# Patient Record
Sex: Female | Born: 1935
Health system: Southern US, Community
[De-identification: ages and names within clinical notes are randomized; demographics above are authoritative.]

## PROBLEM LIST (undated history)

## (undated) DIAGNOSIS — R569 Unspecified convulsions: Secondary | ICD-10-CM

## (undated) DIAGNOSIS — N390 Urinary tract infection, site not specified: Secondary | ICD-10-CM

## (undated) DIAGNOSIS — F0390 Unspecified dementia without behavioral disturbance: Secondary | ICD-10-CM

## (undated) DIAGNOSIS — F329 Major depressive disorder, single episode, unspecified: Secondary | ICD-10-CM

## (undated) DIAGNOSIS — I10 Essential (primary) hypertension: Secondary | ICD-10-CM

## (undated) DIAGNOSIS — Z9289 Personal history of other medical treatment: Secondary | ICD-10-CM

## (undated) DIAGNOSIS — R51 Headache: Secondary | ICD-10-CM

## (undated) DIAGNOSIS — F419 Anxiety disorder, unspecified: Secondary | ICD-10-CM

## (undated) DIAGNOSIS — I639 Cerebral infarction, unspecified: Secondary | ICD-10-CM

## (undated) DIAGNOSIS — F32A Depression, unspecified: Secondary | ICD-10-CM

## (undated) HISTORY — DX: Unspecified convulsions: R56.9

## (undated) HISTORY — PX: FOOT SURGERY: SHX648

## (undated) HISTORY — DX: Personal history of other medical treatment: Z92.89

---
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
## 1979-03-18 HISTORY — PX: ABDOMINAL HYSTERECTOMY: SHX81

## 1979-03-18 HISTORY — PX: APPENDECTOMY: SHX54

## 1991-07-18 HISTORY — PX: CHOLECYSTECTOMY: SHX55

## 2000-07-17 DIAGNOSIS — I639 Cerebral infarction, unspecified: Secondary | ICD-10-CM

## 2000-07-17 HISTORY — DX: Cerebral infarction, unspecified: I63.9

## 2001-09-12 ENCOUNTER — Encounter: Payer: Self-pay | Admitting: Family Medicine

## 2001-09-12 ENCOUNTER — Encounter: Admission: RE | Admit: 2001-09-12 | Discharge: 2001-09-12 | Payer: Self-pay | Admitting: Family Medicine

## 2001-11-26 ENCOUNTER — Inpatient Hospital Stay (HOSPITAL_COMMUNITY): Admission: EM | Admit: 2001-11-26 | Discharge: 2001-11-29 | Payer: Self-pay | Admitting: Emergency Medicine

## 2001-11-26 ENCOUNTER — Encounter: Payer: Self-pay | Admitting: Emergency Medicine

## 2001-11-27 ENCOUNTER — Encounter: Payer: Self-pay | Admitting: Internal Medicine

## 2002-01-31 ENCOUNTER — Encounter: Payer: Self-pay | Admitting: Emergency Medicine

## 2002-02-01 ENCOUNTER — Emergency Department (HOSPITAL_COMMUNITY): Admission: EM | Admit: 2002-02-01 | Discharge: 2002-02-01 | Payer: Self-pay | Admitting: Emergency Medicine

## 2002-03-25 ENCOUNTER — Encounter: Payer: Self-pay | Admitting: Orthopedic Surgery

## 2002-03-31 ENCOUNTER — Inpatient Hospital Stay (HOSPITAL_COMMUNITY): Admission: RE | Admit: 2002-03-31 | Discharge: 2002-04-04 | Payer: Self-pay | Admitting: Orthopedic Surgery

## 2002-03-31 ENCOUNTER — Encounter: Payer: Self-pay | Admitting: Orthopedic Surgery

## 2002-04-01 ENCOUNTER — Encounter: Payer: Self-pay | Admitting: Orthopedic Surgery

## 2004-05-10 ENCOUNTER — Emergency Department (HOSPITAL_COMMUNITY): Admission: EM | Admit: 2004-05-10 | Discharge: 2004-05-10 | Payer: Self-pay | Admitting: Emergency Medicine

## 2004-11-28 ENCOUNTER — Encounter: Admission: RE | Admit: 2004-11-28 | Discharge: 2004-11-28 | Payer: Self-pay | Admitting: Family Medicine

## 2005-01-13 ENCOUNTER — Ambulatory Visit (HOSPITAL_COMMUNITY): Admission: RE | Admit: 2005-01-13 | Discharge: 2005-01-13 | Payer: Self-pay | Admitting: Neurology

## 2005-07-03 ENCOUNTER — Emergency Department (HOSPITAL_COMMUNITY): Admission: EM | Admit: 2005-07-03 | Discharge: 2005-07-03 | Payer: Self-pay | Admitting: Emergency Medicine

## 2006-01-01 ENCOUNTER — Emergency Department (HOSPITAL_COMMUNITY): Admission: EM | Admit: 2006-01-01 | Discharge: 2006-01-01 | Payer: Self-pay | Admitting: Emergency Medicine

## 2006-09-18 ENCOUNTER — Ambulatory Visit (HOSPITAL_BASED_OUTPATIENT_CLINIC_OR_DEPARTMENT_OTHER): Admission: RE | Admit: 2006-09-18 | Discharge: 2006-09-18 | Payer: Self-pay | Admitting: Orthopedic Surgery

## 2006-10-04 ENCOUNTER — Ambulatory Visit (HOSPITAL_BASED_OUTPATIENT_CLINIC_OR_DEPARTMENT_OTHER): Admission: RE | Admit: 2006-10-04 | Discharge: 2006-10-04 | Payer: Self-pay | Admitting: Orthopedic Surgery

## 2007-02-27 ENCOUNTER — Encounter: Admission: RE | Admit: 2007-02-27 | Discharge: 2007-02-27 | Payer: Self-pay | Admitting: Family Medicine

## 2007-03-04 ENCOUNTER — Encounter: Admission: RE | Admit: 2007-03-04 | Discharge: 2007-03-04 | Payer: Self-pay | Admitting: Family Medicine

## 2007-07-18 HISTORY — PX: EYE SURGERY: SHX253

## 2007-09-23 ENCOUNTER — Emergency Department (HOSPITAL_COMMUNITY): Admission: EM | Admit: 2007-09-23 | Discharge: 2007-09-23 | Payer: Self-pay | Admitting: Emergency Medicine

## 2008-03-02 ENCOUNTER — Encounter: Admission: RE | Admit: 2008-03-02 | Discharge: 2008-03-02 | Payer: Self-pay | Admitting: Family Medicine

## 2008-04-02 ENCOUNTER — Emergency Department (HOSPITAL_COMMUNITY): Admission: EM | Admit: 2008-04-02 | Discharge: 2008-04-02 | Payer: Self-pay | Admitting: Emergency Medicine

## 2009-03-03 ENCOUNTER — Encounter: Admission: RE | Admit: 2009-03-03 | Discharge: 2009-03-03 | Payer: Self-pay | Admitting: Family Medicine

## 2010-03-08 ENCOUNTER — Encounter: Admission: RE | Admit: 2010-03-08 | Discharge: 2010-03-08 | Payer: Self-pay | Admitting: Family Medicine

## 2010-07-17 HISTORY — PX: JOINT REPLACEMENT: SHX530

## 2010-08-08 ENCOUNTER — Encounter: Payer: Self-pay | Admitting: Family Medicine

## 2010-08-15 ENCOUNTER — Emergency Department (HOSPITAL_BASED_OUTPATIENT_CLINIC_OR_DEPARTMENT_OTHER)
Admission: EM | Admit: 2010-08-15 | Discharge: 2010-08-15 | Payer: Self-pay | Source: Home / Self Care | Admitting: Emergency Medicine

## 2010-08-15 LAB — CBC
HCT: 40.1 % (ref 36.0–46.0)
MCH: 30.7 pg (ref 26.0–34.0)
MCV: 93.3 fL (ref 78.0–100.0)
Platelets: 291 10*3/uL (ref 150–400)
WBC: 14.5 10*3/uL — ABNORMAL HIGH (ref 4.0–10.5)

## 2010-08-15 LAB — COMPREHENSIVE METABOLIC PANEL
Albumin: 4.6 g/dL (ref 3.5–5.2)
CO2: 27 mEq/L (ref 19–32)
Creatinine, Ser: 0.8 mg/dL (ref 0.4–1.2)
GFR calc Af Amer: >60 mL/min (ref >60)
GFR calc non Af Amer: >60 mL/min (ref >60)
Glucose, Bld: 126 mg/dL — ABNORMAL HIGH (ref 70–99)
Potassium: 4.5 mEq/L (ref 3.5–5.1)
Sodium: 144 mEq/L (ref 135–145)
Total Bilirubin: 0.7 mg/dL (ref 0.3–1.2)
Total Protein: 8 g/dL (ref 6.0–8.3)

## 2010-08-15 LAB — DIFFERENTIAL
Basophils Absolute: 0 10*3/uL (ref 0.0–0.1)
Eosinophils Relative: 1 % (ref 0–5)
Lymphs Abs: 4.8 10*3/uL — ABNORMAL HIGH (ref 0.7–4.0)
Monocytes Absolute: 0.9 10*3/uL (ref 0.1–1.0)
Monocytes Relative: 6 % (ref 3–12)
Neutro Abs: 8.6 10*3/uL — ABNORMAL HIGH (ref 1.7–7.7)
Neutrophils Relative %: 59 % (ref 43–77)

## 2010-08-15 LAB — POCT TOXICOLOGY PANEL: Benzodiazepines: POSITIVE

## 2010-08-15 LAB — URINE MICROSCOPIC-ADD ON

## 2010-08-15 LAB — URINALYSIS, ROUTINE W REFLEX MICROSCOPIC
Hgb urine dipstick: NEGATIVE
Specific Gravity, Urine: 1.016 (ref 1.005–1.030)

## 2010-08-15 LAB — ETHANOL: Alcohol, Ethyl (B): <10 mg/dL (ref 0–10)

## 2010-12-02 NOTE — H&P (Signed)
Martha Olsen, Martha Olsen                         ACCOUNT NO.:  [id number]   MEDICAL RECORD NO.:  [id number]                   PATIENT TYPE:  INP   LOCATION:  0477                                 FACILITY:  [facility]   PHYSICIAN:  Martha Olsen, M.D.              DATE OF BIRTH:  Jul 21, 1935   DATE OF ADMISSION:  03/31/2002  DATE OF DISCHARGE:                                HISTORY & PHYSICAL   This admission History and Physical was done in the office on March 25, 2002.   CHIEF COMPLAINT:  Left hip pain.   HISTORY OF PRESENT ILLNESS:  The patient is a 75 year old female who has  been seen in consultation by Martha Olsen, M.D. for ongoing left hip  pain.  She has been followed by Martha Olsen, M.D. for left hip pain and  buttock pain due to arthritis.  She has previously undergone hip injection  without relief.  She has been treated conservatively for hip arthritis;  however, has not improved with conservative measures.  She has been seen in  consultation by Martha Olsen, M.D.  X-rays in the office revealed end-  stage osteoarthritis.  It was felt the patient would benefit from undergoing  a left total hip replacement arthroplasty.  The patient has had a  significant history of a stroke earlier this year.  She has been seen  preoperatively by Martha Olsen, M.D. and felt to be stable for hip  replacement.  Risks and benefits have been discussed and the patient has  elected to proceed with surgery.   ALLERGIES:  No known drug allergies.   CURRENT MEDICATIONS:  1. Hydrochlorothiazide 25 mg daily.  2. Vioxx 25 mg daily--stopped prior to surgery.  3. Hydrocodone 7.5 mg p.r.n. pain.  4. Lipitor 10 mg daily.  5. Plavix--stopped prior to surgery.  6. Aspirin--stopped prior to surgery.   PAST MEDICAL HISTORY:  1. History of CVA in May of 2003.  2. Postmenopausal.  3. History of hypertension.   PAST SURGICAL HISTORY:  1. Cholecystectomy.  2. Hysterectomy.   SOCIAL HISTORY:  She is married, retired, denies the use of tobacco products  or alcohol products.  Has four children.  Her granddaughters will be  assisting her after surgery.  She lives in a two-story home with 14 stairs.   FAMILY HISTORY:  Father deceased at age 62, had a history of MI.  Mother  deceased at age 36 with a history of stroke.   REVIEW OF SYSTEMS:  GENERAL:  No fevers, chills, night sweats.  NEUROLOGICAL:  Denies seizures, syncope, paralysis. RESPIRATORY:  No  shortness of breath, productive cough, hemoptysis. CARDIOVASCULAR:  No chest  pain, angina, orthopnea.  GI:  Denies nausea, vomiting, diarrhea,  constipation.  GU:  No dysuria, hematuria, or discharge.  MUSCULOSKELETAL:  Pertinent as found in the History of Present Illness concerning the left  hip.  PHYSICAL EXAMINATION:  VITAL SIGNS:  Pulse 80, respirations 16, blood  pressure 164/84.  GENERAL:  The patient is a 75 year old female, well nourished, well  developed.  She is in no acute distress.  She is alert, oriented,  cooperative and very pleasant on the exam.  She is accompanied by her  husband during the visit.  HEENT:  Normocephalic, atraumatic.  Pupils round and reactive.  EOMs are  intact.  Oropharynx clear.  The patient does have upper and lower dentures  noted.  NECK:  Supple.  CHEST:  Clear to auscultation anterior posterior chest walls.  HEART:  Regular rate and rhythm.  No murmurs, S1 & S2 noted.  ABDOMEN:  Soft, nontender, bowel sounds present.  RECTAL, BREAST, GENITALIA:  Not done and not pertinent to present illness.  EXTREMITIES:  Limited to the left lower extremity, she does ambulate with an  antalgic gait.  There is no erythema concerning the left hip.  She does have  pain with flexion and internal rotation concerning the left hip.  Sensation  is intact.   IMPRESSION:  1. Osteoarthritis left hip.  2. Hypertension.  3. History of cerebrovascular accident in 2003.  4. Postmenopausal.    PLAN:  The patient being admitted to [facility] and undergo left  total hip replacement arthroplasty.  The patient has been seen  perioperatively by Martha Olsen, M.D. and felt to be stable for surgery.  Dr. Clovis Riley will be notified of the room number on admission and be  consulted for medical assistance with this patient if required during the  hospital course.      Martha Olsen, [place]                Martha Rankin Olsen, M.D.    ALP/MEDQ  D:  04/02/2002  T:  04/02/2002  Job:  [id number]

## 2010-12-02 NOTE — Op Note (Signed)
Martha Olsen, Martha Olsen               ACCOUNT NO.:  [id number]   MEDICAL RECORD NO.:  [id number]          PATIENT TYPE:  AMB   LOCATION:  DSC                          FACILITY:  [facility]   PHYSICIAN:  Leonides Grills, M.D.     DATE OF BIRTH:  [birth year]   DATE OF PROCEDURE:  09/18/2006  DATE OF DISCHARGE:                               OPERATIVE REPORT   PREOPERATIVE DIAGNOSES:  1. Right great toe metatarsophalangeal joint arthritis with dorsal      bunion.  2. Right tight gastrocnemius.   POSTOPERATIVE DIAGNOSES:  1. Right great toe metatarsophalangeal joint arthritis with dorsal      bunion.  2. Right tight gastrocnemius.   OPERATION:  1. Right great toe metatarsophalangeal joint fusion.  2. Right local bone graft.  3. Stress x-rays, right foot.  4. Right gastrocnemius slide.   ANESTHESIA:  General.   SURGEON:  Leonides Grills, M.D.   ASSISTANT:  Evlyn Kanner, P.A.   ESTIMATED BLOOD LOSS:  Minimal.   TOURNIQUET TIME:  Approximately an hour.   COMPLICATIONS:  None.   DISPOSITION:  Stable to PAR.   INDICATIONS:  A 75 year old female who has had longstanding dorsal  bunion with arthritic changes after a failed bunionectomy.  She was  consented for the above procedure.  All risks, which include infection,  nerve or vessel injury, nonunion, malunion, hardware rotation, hardware  failure, persistent pain, worse pain, prolonged recovery, stiffness,  arthritis, cock-up toe deformity, flexion contracture of the IP joint  that may cause symptoms and may require an FHL tenotomy, were all  explained.  Questions were encouraged and answered.   OPERATIVE PROCEDURE:  The patient was brought to the operating room and  placed in supine position after adequate general endotracheal tube  anesthesia administered as well as Ancef 1 g IV piggyback.  The right  lower extremity was then prepped and draped in a sterile manner over a  proximally-placed thigh tourniquet.  We started the procedure  with a  longitudinal incision over the medial aspect of the gastrocnemius  musculotendinous junction.  Dissection was carried down through skin.  Hemostasis was obtained.  The fascia was opened in line with the  incision.  The conjoint region was then developed between the gastroc  and soleus.  Soft tissues then elevated off the posterior aspect of the  gastrocnemius tendon.  The sural nerve was identified and protected  posteriorly.  The gastrocnemius was then released with a curved Mayo  scissors with the aid of a Kocher.  This had excellent release of the  tight gastroc.  The area was copiously irrigated with normal saline.  The subcu was closed with 3-0 Vicryl.  The skin was closed 4-0 Monocryl  subcuticular stitch.  Steri-Strips were applied.  The limb was gravity-  exsanguinated and the tourniquet was elevated to 290 mmHg.  A  longitudinal incision over the medial aspect of the great toe MTP joint  was then made.  Dissection was carried down through skin.  Hemostasis  was obtained.  The capsule was opened in line with the incision.  The  joint  was then entered.  A soft tissue release both dorsally and  plantarly to allow [place] to relocate the toe.  The remaining cartilage,  which was minimal, was removed from either side of the joint.  The bone  was severely osteopenic in consistency and soft.  Multiple 2-mm drill  holes were placed on either side of the joint.  A 1.5 mm K-wire was then  utilized to reduce the toe in an anatomic position in AP and lateral  planes.  In the lateral plane the proximal phalanx was just dorsal to  parallel with the plantar aspect, weightbearing aspect of the foot.  This was verified under C-arm guidance in the AP and lateral planes.  We  did this purposely because of the slight contracture she has had through  the IP joint, and the toe with this slight contracture was only about 3  mm off the weightbearing surface of the foot.  We then throughout the  case  obtained local bone graft from the drills as well as the spurs that  were debrided.  We then placed a 3.5 mm fully-threaded cortical lag  screw using a 3.5 and 2.5 mm drill hole, respectively.  This was from  the plantar medial aspect of the base of the proximal phalanx into the  metatarsal head.  We then removed the K-wire and then placed a screw in  line with this K-wire.  Again, this was 3.5, 2.5 mm drill hole,  respectively.  This had excellent purchase and maintenance of the  correction.  Final x-rays were obtained in the AP and lateral planes.  stress x-rays which showed no gross motion across the site of fixation,  proper position and excellent alignment as well.  The area was copiously  irrigated with normal saline.  Stress-strain relieving local bone graft  was applied using a bur and the graft obtained throughout the procedure.  The tourniquet was deflated.  Hemostasis was obtained.  The capsule was  closed with 3-0 Vicryl.  The skin was closed with 4-0 nylon.  A sterile  dressing was applied.  A modified Jones dressing was applied with the  ankle in neutral dorsiflexion.  The patient was stable to the PAR.   POSTOP COURSE:  The patient will follow up in 2 weeks.  At that time we  will remove the Jones dressing as well as sutures.  She is to remain  nonweightbearing for a total of 1 month and then heel weightbear in a  Cam walker boot for an additional month.  Depending on her symptoms as  well as x-ray findings, she will then go into a normal shoe.  She is to  keep this elevated.  Active range of motion of the toes is encouraged.      Leonides Grills, M.D.  Electronically Signed     PB/MEDQ  D:  09/18/2006  T:  09/18/2006  Job:  [id number]

## 2010-12-02 NOTE — Discharge Summary (Signed)
NAMEMAYLEIGH, TETRAULT                         ACCOUNT NO.:  [id number]   MEDICAL RECORD NO.:  [id number]                   PATIENT TYPE:  INP   LOCATION:  0477                                 FACILITY:  [facility]   PHYSICIAN:  Gus Rankin. Aluisio, M.D.              DATE OF BIRTH:  11/29/1935   DATE OF ADMISSION:  03/31/2002  DATE OF DISCHARGE:  04/04/2002                                 DISCHARGE SUMMARY   ADMISSION DIAGNOSIS:  1. Osteoarthritis, left hip.  2. Hypertension.  3. History of cardiovascular accident in 2003.  4. Postmenopausal.   DISCHARGE DIAGNOSIS:  1. Osteoarthritis, left hip, status post left total hip replacement     arthroplasty.  2. Mild postoperative blood loss anemia.  3. Hypertension.  4. History of cardiovascular accident in 2003.  5. Postmenopausal.   PROCEDURE:  The patient was taken to the OR on March 31, 2002 and  underwent left total hip replacement arthroplasty.   SURGEON:  Ollen Gross, M.D.   ASSISTANT:  Patrica Duel, P.A.C.   ANESTHESIA:  General.   DRAINS:  Hemovac drain times one.   CONSULTATIONS:  none.   HISTORY OF PRESENT ILLNESS:  The patient is a 75 year old female who has  been seen in consultation by Dr. Lequita Halt for ongoing left hip pain. She was  referred over by Dr. Ethelene Hal. She has previously undergone hip injection  without relief and has been treated conservatively for hip arthritis.  However, she has not improved. She was seen in consultation by Dr. Lequita Halt  and found to have end stage osteoarthritis. It was felt that she would  benefit from undergoing total hip replacement. Risks and benefits were  discussed and the patient elected to proceed with surgery.   LABORATORY DATA:  CBC on admission revealed hemoglobin and hematocrit 11.2  and 33.3. White cell count 9.9, red blood cells count 3.93. Differential  within normal limits. Postoperative hemoglobin and hematocrit 9.5 and 29.6.  Last noted hemoglobin and  hematocrit 9.10 and 27.6. PT and PTT 11.7 and 26  respectively with an INR of 0.8. Serial protimes followed for Coumadin  protocol. Last noted PT and INR 23.9 and 2.5. Chemistry on admission  revealed minimally elevated glucose of 117. Remaining chem panel all within  normal limits. Serial B-met showed electrolytes to be within normal limits.  Glucose went up from 117 to 155. UA on admission showed trace leukocyte  esterase with rare epithelial cells and 0-2 white cells. Otherwise,  negative. Follow-up UA on April 01, 2002 was negative. Blood group type  was 0+. Urine culture taken on April 01, 2002 showed no growth.   DIAGNOSTIC STUDIES:  Preoperative  chest x-ray showed no active disease.  Preoperative  hip films revealed severe degenerative arthritis of the left  hip with complete loss of superior joint space height. Postoperative hip  films on March 31, 2002 as well as pelvic and left hip  films showed good  position and alignment following left total hip prosthesis. Follow-up chest  x-ray on March 22, 2002 showed no active disease. I do not see an EKG on  the chart.   HOSPITAL COURSE:  The patient was admitted to [facility]  and taken to the OR and underwent above stated procedure without  complications. The patient tolerated the procedure well. Was later  transferred to the recovery room and to the orthopedic floor with continued  postoperative care. Placed on PCA analgesics for pain control following  surgery. Underwent 24 hours of postoperative IV antibiotics. Placed touch  down weight bearing. Labs were followed closely. May be found in the lab  data section as above. Hemovac drain placed at the time of surgery was  pulled on postoperative day one. X-ray's of the hip showed good position and  alignment of the left total hip. The patient unfortunately spiked a  temperature up to 101.1 on the evening of the postoperative day one.  Underwent chest  x-ray and UA. The UA was negative. The chest x-ray showed no  active disease. Felt to be a cyclic temperature through the night.  Encouraged incentive spirometer and antipruritic medications. Temperature  was back down and she was afebrile by postoperative day three. PT and OT  were consulted to assist with gait training and ambulation from a therapy  standpoint. The patient did fairly well. She was up ambulating approximately  45 feet by postoperative day two and then greater than 120 feet by  postoperative day three. Dressing changes initiated on postoperative day two  and incision looked excellent. No signs of infection. Weaned off PCA  analgesics over to P.O. med's. Continued to progress with therapy. Tolerated  medications and by postoperative day four, doing quite well. Had moved her  bowels and voiding okay.   DISPOSITION:  Discharged to home on April 04, 2002.   DISCHARGE MEDICATIONS:  Coumadin, Percocet, and Robaxin.   DIET:  Low sodium diet.   ACTIVITY:  Touch down weight bearing. Home health PT. Home health nursing  through [place]. Hip precautions at all times.   FOLLOW UP:  End of the following week following her discharge. Call for an  appointment.   CONDITION ON DISCHARGE:  Improved.     Alexzandrew L. Julien Girt, P.A.              Gus Rankin Aluisio, M.D.    ALP/MEDQ  D:  04/29/2002  T:  04/29/2002  Job:  [id number]   cc:   Ollen Gross, MD  [address]  [place]  [place] [address]  Fax: [phone]

## 2010-12-02 NOTE — Discharge Summary (Signed)
[facility]  Patient:    Martha Olsen, Martha Olsen Visit Number: [id number] MRN: [id number]          Service Type: MED Location: [id number] Attending Physician:  Darnelle Bos Dictated by:   Theressa Millard, M.D. Admit Date:  11/26/2001 Discharge Date: 11/29/2001   CC:         Dr. Lupe Carney   Discharge Summary  ADMISSION DIAGNOSIS:  Stroke.  DISCHARGE DIAGNOSES: 1. Stroke, right basal ganglia, source unidentified. 2. Anemia, normocytic, with elevated RDW.  Peripheral lab work-up negative. 3. Arthritis involving the hip. 4. Hypertension.  HISTORY OF PRESENT ILLNESS:  The patient is a 75 year old white female who suffered a stroke causing her to have slurred speech and facial drooping approximately two days prior to admission.  HOSPITAL COURSE:  The patient was admitted and was already improving by the time she arrived to the hospital approximately 48 hours after the onset of symptoms.  She was admitted by Dr. _____________ and underwent CT scanning which confirmed a right basal ganglia stroke.  There was no evidence of hemorrhage. She was chronically on aspirin at home and Plavix was added. Heparin was not thought to be indicated as there was no evidence of cardiac source.  MRI confirmed the basal ganglia stroke.  There was no evidence of intracranial or carotid disease on MRA and 2-D echocardiogram was negative. Doppler examination did reveal some 40 to 60% left internal carotid artery stenosis (opposite side from stroke), but no disease on the right.  The source of the patients stroke was unknown.  She was seen by speech therapy and was on a diet with chopped meats mainly for dental issues but otherwise was able to eat and drink thin liquids and all other consistencies. She was never seen by OT and PT as that was not necessary. She was able to ambulate without problems. She was discharged in improved condition.  Upon admission, she  was noted to be slightly anemic. Her hemoglobin was 10.5. Two days later, it was 10.2.  MCV was normal but RDW was slightly elevated. Therefore, sed rate, RBC folate, ferritin, iron and iron binding capacity and B12 levels were checked.  All of these were within normal limits.  The cause of her anemia is not known at this time and this should be monitored as an outpatient.  Her blood pressure was well controlled during her hospitalization.  MEDICATIONS: 1. Hydrochlorothiazide 25 mg once daily. 2. Vioxx 25 mg daily. 3. _______ 75 mg daily. 4. Aspirin one daily.  ACTIVITIES:  She may slowly increase activities and may return to work on Dec 06, 2001.  DIET:  She is to eat a no added salt, low fat diet.  FOLLOW-UP:  She will call to make an appointment to see Dr. Lupe Carney in approximately two weeks. At that time, consideration should also be give to review her cholesterol to decide on whether the patient will need a cholesterol lowering medication. Dictated by:   Theressa Millard, M.D. Attending Physician:  Darnelle Bos DD:  11/29/01 TD:  12/02/01 Job: [id number]

## 2010-12-02 NOTE — Op Note (Signed)
NAMEMARONDA, CAISON               ACCOUNT NO.:  [id number]   MEDICAL RECORD NO.:  [id number]          PATIENT TYPE:  AMB   LOCATION:  DSC                          FACILITY:  [facility]   PHYSICIAN:  Leonides Grills, M.D.     DATE OF BIRTH:  1935-09-25   DATE OF PROCEDURE:  10/04/2006  DATE OF DISCHARGE:                               OPERATIVE REPORT   PREOPERATIVE DIAGNOSES:  1. Status post right great toe MTP joint fusion and loss of fixation.  2. Complication of hardware, foot.   POSTOPERATIVE DIAGNOSIS:  Status post right great toe MTP joint fusion  and loss of fixation.   OPERATION:  1. Revision, right great toe MTP joint fusion.  2. Stress x-rays, right foot.  3. Hardware removal, foot.   ANESTHESIA:  General.   SURGEON:  Leonides Grills, M.D.   ASSISTANT:  Evlyn Kanner, P.A.-C.   ESTIMATED BLOOD LOSS:  Minimal.   TOURNIQUET TIME:  Approximately an hour.   COMPLICATIONS:  None.   DISPOSITION:  Stable to PR.   INDICATIONS:  This is a 75 year old female who has had longstanding  right great toe pain secondary MTP joint arthritis.  She underwent 2  weeks ago a right great toe MTP joint fusion.  She stubbed against her  bedpost some time during the 2 weeks and presented to the office  yesterday with a extension deformity through the fusion site.  X-ray  showed that she had a loss of fixation, which caused a fracture at the  base of proximal phalanx and obviously was in a bad position.  She was  consented for the above procedure.  All risks which include infection,  nerve or vessel injury, nonunion, malunion, hardware rotation, hardware  failure, persistent pain, worse pain, prolonged recovery, stiffness,  arthritis of neighboring joints were all explained.  Questions were  encouraged and answered.   OPERATIVE PROCEDURE:  The patient was brought to the operating room and  placed in supine position.  After adequate general endotracheal tube  anesthesia was administered with  popliteal block as well as Ancef 1 g IV  piggyback, the right lower extremity was prepped and draped in sterile  manner over a proximally placed thigh tourniquet.  Limb was gravity  exsanguinated.  Tourniquet was elevated to 290 mmHg.  Longitudinal  incision over the previous incision was then made, and this was extended  approximately 1.5-cm proximally and distally.  Dissection was carried  down to bone.  Remaining 2 screws were removed.  Once the hardware was  removed, it was found that she did fracture the base of the proximal  phalanx and had the extension deformity.  We then chose a 5-hole 1/3  tubular plate x2 and prebent this and placed this in a stacked  figuration.  We then obtained in AP and lateral views x-rays to make  sure that this was well aligned and especially where the proximal  phalanx was parallel to the plantar aspect of her foot, and this was  verified on lateral x-ray with an instrument cover placed on the plantar  aspect of her foot and  showed that this was in excellent position.  We  then placed 2 distal 3.5-mm fully-threaded cortical screws using 2.5-mm  drill holes, respectively.  There was excellent purchase made in this  correction.  We then placed the three proximal screws into the first  metatarsal in a compression-type manner.  This had excellent compression  across the fusion site.  We then placed another screw from the medial  aspect of the first metatarsal head into the proximal phalanx as a  derotational screw as well.  Final stress x-rays were obtained in AP and  lateral planes and showed no gross motion across the fusion site,  fixation and proposition of toe in excellent alignment as well.  We did  not need to obtain any more bone graft.  The fusion site looked  excellent and actually was starting to already heal with some callus in  this area.  Tourniquet was deflated.  Hemostasis was obtained.  The area  was copiously irrigated with normal saline.   Capsule was closed with 2-0  Vicryl.  Subcutaneous tissue was closed with 3-0 Vicryl.  Skin was  closed 4-0 nylon.  Sterile dressing was applied.  Modified Jones  dressing was applied with the ankle in neutral dorsiflexion, and the  patient was stable to PR.   POSTOPERATIVE COURSE:  The patient will follow up in 2 weeks.  At that  time, we will remove the Jones dressing as well as sutures.  We will  then place her in a short-leg nonweight bearing cast, and this will be  extended out beyond the great toe with the ankle in neutral  dorsiflexion.  She will be nonweightbearing for a total of 2 months and  go into a cam walker and go weight bearing as tolerated.  At the end of  3 months, she will go into a normal shoe.      Leonides Grills, M.D.  Electronically Signed     PB/MEDQ  D:  10/04/2006  T:  10/04/2006  Job:  [id number]

## 2010-12-02 NOTE — Op Note (Signed)
Martha Olsen                         ACCOUNT NO.:  [id number]   MEDICAL RECORD NO.:  [id number]                   PATIENT TYPE:  INP   LOCATION:  X001                                 FACILITY:  [facility]   PHYSICIAN:  Gus Rankin. Aluisio, M.D.              DATE OF BIRTH:  [birth year]   DATE OF PROCEDURE:  03/31/2002  DATE OF DISCHARGE:                                 OPERATIVE REPORT   PREOPERATIVE DIAGNOSES:  Osteoarthritis of the left hip.   POSTOPERATIVE DIAGNOSES:  Osteoarthritis of the left hip.   PROCEDURE:  Left total hip arthroplasty.   SURGEON:  Gus Rankin. Aluisio, M.D.   ASSISTANT:  Alexzandrew L. Julien Girt, P.A.   ANESTHESIA:  General.   ESTIMATED BLOOD LOSS:  200 during Hemovac x1   COMPLICATIONS:  None.   CONDITION:  Stable to recovery.   BRIEF CLINICAL NOTE:  This patient is a 75 year old female with severe  osteoarthritis of the left hip with pain refractory to nonoperative  management.  She also has dysplastic change of the hip, both in the  acetabulum and femur.  She presents now for left total hip arthroplasty.   PROCEDURE IN DETAIL:  After successful administration of general anesthetic,  the patient was placed in the right lateral decubitus position with the left  side up and held with the hip positioner.  The left lower extremity was  isolated from the peroneum with plastic drapes and prepped and draped in the  usual sterile fashion.  A standard posterolateral incision was made with a  10 blade through the subcutaneous tissue to the level of the fascia lata.  A  fresh blade was used to incise the fascia lata in line with the skin  incision.  The sciatic nerve was palpated and protected, and short external  rotators were isolated off the femur.  A capsulectomy was performed.  The  hip dislocated and the center of the femoral head was marked.  A trial  prosthesis was placed.  This was centered with the trial head corresponding  to the center of the  patient's native femoral head.  Osteotomy line was  marked on the femoral neck, and an osteotomy was made with an oscillating  saw.  The femoral head was then removed.  Acetabular exposure was obtained  and the labrum removed.  Acetabular reaming was then initiated, starting at  45, coursing up in increments to 51 and a 52 mm pinnacle acetabular shell  was placed in anatomic position anteriorly and was affixed with a single  dome screw with excellent purchase.  A trial 52 mm neutral liner was placed.   The femur was then prepared first with a canal finder, and the canal was  irrigated.  The actual reaming was performed with a 11.5 proximal, reaming  up to a 16 D and the ___________ machine to a large.  A 16 D large trial  sleeve  was placed with a 16 x 11 stem and a 36+6 and 28+0 head.  We matched  her native anteversion, which was about 25 degrees, slightly more than  normal.  The hip was then reduced with excellent stability, full extension,  full external rotation, 70 degrees of flexion, 40 degrees of abduction, 90  degrees of internal rotation, 90 degrees of flexion, and 90 degrees of  internal rotation.  The hip was then dislocated.  Trials were removed, and a  permanent apex hole eliminator placed through the acetabular shell.  A  permanent 28 mm neutral Marathon liner was then impacted into the shell.  Proximally in the femur, the 16 D large sleeve is placed with a 16 x 11  stem, 36+6 neck, matching her native anteversion.  The 28+3 head is  utilized.  The head also utilized in a trial, and I had very soft tissue  tension with that head.  The 28+3 permanent head was utilized.  The hip was  reduced with the same outstanding stability parameters.  The wound was  copiously irrigated with antibiotic solution.  The short external rotators  were reattached through the femur through drill holes.  The fascia lata was  closed over a Hemovac drain with interrupted #1 Vicryl.  The subcu was   closed with interrupted #1 and 2-0 Vicryl, and the subcuticular was closed  with running 4-0 Monocryl.  The incision was clean and dry, and Steri-Strips  with a bulky sterile dressing was applied.  The patient was subsequently  awakened and transported to recovery in stable condition.                                               Gus Rankin Aluisio, M.D.    FVA/MEDQ  D:  03/31/2002  T:  03/31/2002  Job:  [id number]

## 2011-01-30 ENCOUNTER — Other Ambulatory Visit: Payer: Self-pay | Admitting: Family Medicine

## 2011-01-30 DIAGNOSIS — Z1231 Encounter for screening mammogram for malignant neoplasm of breast: Secondary | ICD-10-CM

## 2011-01-31 ENCOUNTER — Ambulatory Visit (HOSPITAL_COMMUNITY)
Admission: RE | Admit: 2011-01-31 | Discharge: 2011-01-31 | Disposition: A | Discharge status: Home / Self Care | Payer: Medicare Other | Source: Ambulatory Visit | Attending: Orthopedic Surgery | Admitting: Orthopedic Surgery

## 2011-01-31 ENCOUNTER — Other Ambulatory Visit (HOSPITAL_COMMUNITY): Payer: Medicare Other

## 2011-01-31 ENCOUNTER — Encounter (HOSPITAL_COMMUNITY): Payer: Medicare Other

## 2011-01-31 ENCOUNTER — Other Ambulatory Visit: Payer: Self-pay | Admitting: Orthopedic Surgery

## 2011-01-31 ENCOUNTER — Other Ambulatory Visit (HOSPITAL_COMMUNITY): Payer: Self-pay | Admitting: Orthopedic Surgery

## 2011-01-31 DIAGNOSIS — Z01818 Encounter for other preprocedural examination: Secondary | ICD-10-CM | POA: Insufficient documentation

## 2011-01-31 DIAGNOSIS — Z01812 Encounter for preprocedural laboratory examination: Secondary | ICD-10-CM | POA: Insufficient documentation

## 2011-01-31 DIAGNOSIS — M47814 Spondylosis without myelopathy or radiculopathy, thoracic region: Secondary | ICD-10-CM | POA: Insufficient documentation

## 2011-01-31 DIAGNOSIS — Z01810 Encounter for preprocedural cardiovascular examination: Secondary | ICD-10-CM | POA: Insufficient documentation

## 2011-01-31 DIAGNOSIS — Z9089 Acquired absence of other organs: Secondary | ICD-10-CM | POA: Insufficient documentation

## 2011-01-31 DIAGNOSIS — M1710 Unilateral primary osteoarthritis, unspecified knee: Secondary | ICD-10-CM | POA: Insufficient documentation

## 2011-01-31 LAB — APTT: aPTT: 30 seconds (ref 24–37)

## 2011-01-31 LAB — CBC
HCT: 39.5 % (ref 36.0–46.0)
MCHC: 32.9 g/dL (ref 30.0–36.0)
Platelets: 274 10*3/uL (ref 150–400)
RBC: 4.28 MIL/uL (ref 3.87–5.11)
RDW: 13.6 % (ref 11.5–15.5)

## 2011-01-31 LAB — PROTIME-INR
INR: 0.98 (ref 0.00–1.49)
Prothrombin Time: 13.2 seconds (ref 11.6–15.2)

## 2011-01-31 LAB — COMPREHENSIVE METABOLIC PANEL
Albumin: 4.1 g/dL (ref 3.5–5.2)
Alkaline Phosphatase: 68 U/L (ref 39–117)
Creatinine, Ser: 0.57 mg/dL (ref 0.50–1.10)
Sodium: 141 mEq/L (ref 135–145)
Total Bilirubin: 0.3 mg/dL (ref 0.3–1.2)

## 2011-01-31 LAB — URINALYSIS, ROUTINE W REFLEX MICROSCOPIC
Bilirubin Urine: NEGATIVE
Glucose, UA: NEGATIVE mg/dL
Hgb urine dipstick: NEGATIVE
Protein, ur: NEGATIVE mg/dL
Specific Gravity, Urine: 1.023 (ref 1.005–1.030)
pH: 6 (ref 5.0–8.0)

## 2011-01-31 LAB — SURGICAL PCR SCREEN: MRSA, PCR: NEGATIVE

## 2011-02-13 ENCOUNTER — Inpatient Hospital Stay (HOSPITAL_COMMUNITY)
Admission: RE | Admit: 2011-02-13 | Discharge: 2011-02-16 | DRG: 470 | Disposition: A | Discharge status: Skilled Nursing Facility | Payer: Medicare Other | Source: Ambulatory Visit | Attending: Orthopedic Surgery | Admitting: Orthopedic Surgery

## 2011-02-13 DIAGNOSIS — I10 Essential (primary) hypertension: Secondary | ICD-10-CM | POA: Diagnosis present

## 2011-02-13 DIAGNOSIS — E871 Hypo-osmolality and hyponatremia: Secondary | ICD-10-CM | POA: Diagnosis not present

## 2011-02-13 DIAGNOSIS — Z87440 Personal history of urinary (tract) infections: Secondary | ICD-10-CM

## 2011-02-13 DIAGNOSIS — E119 Type 2 diabetes mellitus without complications: Secondary | ICD-10-CM | POA: Diagnosis present

## 2011-02-13 DIAGNOSIS — Z96659 Presence of unspecified artificial knee joint: Secondary | ICD-10-CM

## 2011-02-13 DIAGNOSIS — Z01812 Encounter for preprocedural laboratory examination: Secondary | ICD-10-CM

## 2011-02-13 DIAGNOSIS — F341 Dysthymic disorder: Secondary | ICD-10-CM | POA: Diagnosis present

## 2011-02-13 DIAGNOSIS — D62 Acute posthemorrhagic anemia: Secondary | ICD-10-CM | POA: Diagnosis not present

## 2011-02-13 DIAGNOSIS — M1710 Unilateral primary osteoarthritis, unspecified knee: Principal | ICD-10-CM | POA: Diagnosis present

## 2011-02-13 DIAGNOSIS — E7800 Pure hypercholesterolemia, unspecified: Secondary | ICD-10-CM | POA: Diagnosis present

## 2011-02-13 DIAGNOSIS — Z8673 Personal history of transient ischemic attack (TIA), and cerebral infarction without residual deficits: Secondary | ICD-10-CM

## 2011-02-13 LAB — GLUCOSE, CAPILLARY
Glucose-Capillary: 112 mg/dL — ABNORMAL HIGH (ref 70–99)
Glucose-Capillary: 216 mg/dL — ABNORMAL HIGH (ref 70–99)
Glucose-Capillary: 153 mg/dL — ABNORMAL HIGH (ref 70–99)

## 2011-02-13 LAB — TYPE AND SCREEN

## 2011-02-13 NOTE — Op Note (Signed)
Martha Olsen, Martha Olsen               ACCOUNT NO.:  [id number]  MEDICAL RECORD NO.:  [id number]  LOCATION:  0004                         FACILITY:  [facility]  PHYSICIAN:  Ollen Gross, M.D.    DATE OF BIRTH:  06-17-1936  DATE OF PROCEDURE:  02/13/2011 DATE OF DISCHARGE:                              OPERATIVE REPORT   PREOPERATIVE DIAGNOSIS:  Osteoarthritis, left knee.  POSTOPERATIVE DIAGNOSIS:  Osteoarthritis, left knee.  PROCEDURE:  Left total knee arthroplasty.  SURGEON:  Ollen Gross, M.D.  ASSISTANT:  Alexzandrew L. Perkins, P.A.C.  ANESTHESIA:  Spinal.  ESTIMATED BLOOD LOSS:  Minimal.  DRAINS:  Hemovac x1.  TOURNIQUET TIME:  32 minutes at 300 mmHg.  COMPLICATIONS:  None.  CONDITION:  Stable to recovery.  BRIEF CLINICAL NOTE:  Martha Olsen is a 75 year old female with advanced end-stage arthritis of the left knee with progressively worsening pain and dysfunction.  She has failed nonoperative management and presents for left total knee arthroplasty.  PROCEDURE IN DETAIL:  After successful administration of spinal anesthetic, a tourniquet was placed high on her left thigh and her left lower extremity was prepped and draped in usual sterile fashion. Extremities wrapped in Esmarch, knee flexed, tourniquet inflated to 300 mmHg.  Midline incision was made with a 10 blade through subcutaneous tissue to the level of the extensor mechanism.  A fresh blade is used to make a medial parapatellar arthrotomy.  Soft tissue on the proximal medial tibia is subperiosteally elevated to the joint line with the knife into the semimembranosus bursa with a Cobb elevator.  Soft tissue laterally is elevated with attention being paid to avoid patellar tendon on tibial tubercle.  Patella was everted, knee flexed 90 degrees, ACL and PCL removed.  Drill was used to create a starting hole in the femur and the canal was thoroughly irrigated.  5-degree left valgus alignment guide is placed and the  distal femoral cutting block pinned to remove 10 mm off the distal femur.  Resection is made with an oscillating saw.  The tibia subluxed forward and the menisci removed.  Extramedullary tibial alignment guide is placed, referencing proximally at the medial aspect of tibial tubercle and distally along the second metatarsal axis of the tibial crest.  Block was pinned to remove 2 mm off the more deficient lateral side.  Tibial resection was made with a saw.  Size 2.5 is most appropriate tibial component and the proximal tibia is prepared to modular drill and keel punch for the size 2.5.  Femoral size is placed, size 2.5 is most appropriate femoral component. Rotations marked off the epicondylar axis and confirmed by creating rectangular flexion gap at 90 degrees.  Block is pinned in that rotation.  Anterior, posterior and chamfer cuts were made.  The intercondylar blocks placed and that cuts made.  Trial size 2.5 posterior stabilized femur was placed.  10-mm posterior stabilized rotating platform insert trial was placed.  With  10, full extension was achieved with excellent varus-valgus and anterior-posterior balance throughout.  Full range of motion.  Patella was everted, thickness measured to be 22 mm.  Freehand resection taken 12 mm, 35 template is placed, lug holes were drilled, trial patella is  placed and it tracks normally.  Osteophytes removed from the posterior femur with the trial in place.  All trials were removed and the cut bone surfaces prepared with pulsatile lavage.  Cement was mixed and once ready for implantation, the size 2.5 mobile bearing tibial tray, 2.5 posterior stabilized femur and 35 patella were cemented into place and patella was held with a clamp.  Trial 10-mm inserts placed, knee held in full extension and all extruded cement removed.  When cement is fully hardened, then the permanent 10-mm posterior stabilized rotating platform insert is placed on tibial  tray.  Wound was copiously irrigated with saline solution and the arthrotomy closed over Hemovac drain with interrupted #1 PDS.  Flexion against gravity is 135 degrees.  Patella tracks normally.  Tourniquet release total time of 32 minutes.  Subcu was closed with interrupted 2-0 Vicryl and subcuticular running 4-0 Monocryl.  Catheter for Marcaine pain pump is placed and pumps initiated.  Incisions cleaned and dried and Steri-Strips and bulky sterile dressing applied.  She is then placed into a knee immobilizer, awakened and transported to recovery in stable condition.     Ollen Gross, M.D.     FA/MEDQ  D:  02/13/2011  T:  02/13/2011  Job:  [id number]  Electronically Signed by Ollen Gross M.D. on 02/13/2011 05:34:59 PM

## 2011-02-14 LAB — CBC
MCHC: 33 g/dL (ref 30.0–36.0)
Platelets: 208 10*3/uL (ref 150–400)
RDW: 13.5 % (ref 11.5–15.5)
WBC: 12.3 10*3/uL — ABNORMAL HIGH (ref 4.0–10.5)

## 2011-02-14 LAB — BASIC METABOLIC PANEL
Chloride: 99 mEq/L (ref 96–112)
Potassium: 3.9 mEq/L (ref 3.5–5.1)
Sodium: 134 mEq/L — ABNORMAL LOW (ref 135–145)

## 2011-02-14 LAB — GLUCOSE, CAPILLARY
Glucose-Capillary: 147 mg/dL — ABNORMAL HIGH (ref 70–99)
Glucose-Capillary: 115 mg/dL — ABNORMAL HIGH (ref 70–99)

## 2011-02-15 LAB — CBC
MCHC: 32.6 g/dL (ref 30.0–36.0)
MCV: 92.3 fL (ref 78.0–100.0)
Platelets: 210 10*3/uL (ref 150–400)
RDW: 13.3 % (ref 11.5–15.5)
WBC: 13.2 10*3/uL — ABNORMAL HIGH (ref 4.0–10.5)

## 2011-02-15 LAB — GLUCOSE, CAPILLARY
Glucose-Capillary: 159 mg/dL — ABNORMAL HIGH (ref 70–99)
Glucose-Capillary: 140 mg/dL — ABNORMAL HIGH (ref 70–99)
Glucose-Capillary: 208 mg/dL — ABNORMAL HIGH (ref 70–99)

## 2011-02-15 LAB — BASIC METABOLIC PANEL
BUN: 5 mg/dL — ABNORMAL LOW (ref 6–23)
Chloride: 99 mEq/L (ref 96–112)
Creatinine, Ser: <0.47 mg/dL — ABNORMAL LOW (ref 0.50–1.10)
Potassium: 3.7 mEq/L (ref 3.5–5.1)

## 2011-02-16 LAB — CBC
HCT: 28.1 % — ABNORMAL LOW (ref 36.0–46.0)
MCHC: 32.4 g/dL (ref 30.0–36.0)
Platelets: 208 10*3/uL (ref 150–400)
RDW: 13.3 % (ref 11.5–15.5)
WBC: 10.9 10*3/uL — ABNORMAL HIGH (ref 4.0–10.5)

## 2011-02-16 LAB — GLUCOSE, CAPILLARY
Glucose-Capillary: 166 mg/dL — ABNORMAL HIGH (ref 70–99)
Glucose-Capillary: 177 mg/dL — ABNORMAL HIGH (ref 70–99)

## 2011-02-27 NOTE — H&P (Signed)
  NAMEALYSIANA, Martha Olsen               ACCOUNT NO.:  [id number]  MEDICAL RECORD NO.:  [id number]  LOCATION:                                 FACILITY:  PHYSICIAN:  Ollen Gross, M.D.    DATE OF BIRTH:  May 06, 1936  DATE OF ADMISSION:  02/13/2011 DATE OF DISCHARGE:                             HISTORY & PHYSICAL   CHIEF COMPLAINT:  Left knee pain.  HISTORY OF PRESENT ILLNESS:  The patient is a 75 year old female who has been seen by Dr. Lequita Halt for ongoing left knee pain.  She has known end- stage arthritis.  She has been treated in the past conservatively including multiple injections.  It is felt, she would benefit from undergoing surgical intervention.  Risks and benefits have been discussed.  She elected to proceed with surgery.  She has previously undergone a left total hip back in 2003 and doing well with that.  ALLERGIES:  CODEINE causes a rash, but she is able to take Percocet.  CURRENT MEDICATIONS:  Fish oil, Zocor, ibuprofen, lisinopril, metformin, Abilify, Zoloft, Effexor, Requip, and multivitamin.  PAST MEDICAL HISTORY: 1. History of stroke in May 2005. 2. History of depression. 3. Hypertension. 4. Hypercholesterolemia. 5. Diabetes mellitus. 6. History of urinary tract infections.  SOCIAL HISTORY:  Left total knee replacement in 2003, 4 surgeries on her right foot.  FAMILY HISTORY:  Father with heart attack.  Mother with stroke.  SOCIAL HISTORY:  Married, retired, nonsmoker.  No alcohol.  Has 4 children.  She does want to look into a rehab facility, consider [facility].  REVIEW OF SYSTEMS:  GENERAL:  No fever or chills, occasional night sweats.  NEURO:  No seizure, syncope, or paralysis.  RESPIRATORY:  No shortness of breath, productive cough, or hemoptysis.  CARDIOVASCULAR: No chest pain, angina, or orthopnea.  GI:  No nausea, vomiting, diarrhea, or constipation.  GU:  A little bit of frequency.  No dysuria, hematuria.  MUSCULOSKELETAL:   Left knee.  PHYSICAL EXAMINATION:  VITAL SIGNS:  Pulse 76, respirations 16, blood pressure 160/92. GENERAL:  A 75 year old white female, well nourished, well developed, in no acute distress.  She is alert, oriented, and cooperative. Accompanied by her husband. HEENT:  Normocephalic, atraumatic.  Pupils are round and reactive.  EOMs intact.  She does have upper and lower denture plates. NECK:  Supple. CHEST:  Clear anterior and posterior chest wall. HEART:  Regular rate and rhythm.  No murmur. ABDOMEN:  Soft, slightly round.  Bowel sounds present. RECTAL/BREASTS/GENITALIA:  Not done, not pertinent to present illness. EXTREMITIES:  Left knee, range of motion 5-115, marked crepitus, tender more lateral than medial.  IMPRESSION:  Osteoarthritis, left knee.  PLAN:  The patient admitted to [facility] to undergo a left total knee replacement arthroplasty.  Surgery will be performed by Dr. Ollen Gross.     Alexzandrew L. Julien Girt, P.A.C.   ______________________________ Ollen Gross, M.D.  ALP/MEDQ  D:  02/12/2011  T:  02/13/2011  Job:  [id number]  cc:   L. Lupe Carney, M.D. Fax: [phone]  Electronically Signed by Patrica Duel P.A.C. on 02/16/2011 09:38:21 AM Electronically Signed by Ollen Gross M.D. on 02/27/2011 11:37:18 AM

## 2011-02-27 NOTE — Discharge Summary (Signed)
Martha Olsen, STOPHER               ACCOUNT NO.:  [id number]  MEDICAL RECORD NO.:  [id number]  LOCATION:  1606                         FACILITY:  [facility]  PHYSICIAN:  Ollen Gross, M.D.    DATE OF BIRTH:  1936/04/04  DATE OF ADMISSION:  02/13/2011 DATE OF DISCHARGE:  02/16/2011                        DISCHARGE SUMMARY - REFERRING   ADMITTING DIAGNOSES: 1. Osteoarthritis, left knee. 2. History of stroke in May 2005. 3. History of depression. 4. Hypertension. 5. Hypercholesterolemia. 6. Diabetes mellitus. 7. Past history of urinary tract infections.  DISCHARGE DIAGNOSES: 1. Osteoarthritis, left knee, status post left total knee replacement     arthroplasty. 2. Postop acute blood loss anemia, did not require transfusion. 3. Postop hyponatremia, improved. 4. History of stroke in May 2005. 5. History of depression. 6. Hypertension. 7. Hypercholesterolemia. 8. Diabetes mellitus. 9. Past history of urinary tract infections.  PROCEDURES:  February 13, 2011, left total knee.  Surgeon, Ollen Gross, M.D.  Assistant, Alexzandrew L. Perkins, P.A.C.  Spinal anesthesia. Tourniquet time 32 minutes.  CONSULTATIONS:  None.  BRIEF HISTORY:  The patient is a 75 year old female with advanced arthritis of the left knee with progressively worsening pain and dysfunction, failed nonoperative management, now presents for total knee arthroplasty.  LABORATORY DATA:  Preop CBC, hemoglobin 13.0, hematocrit 39.5, white cell count 13.2, platelets 274.  PT-INR 13.2 and 0.98 with PTT of 30. Chem panel on admission, glucose of 140, otherwise Chem panel within normal limits.  Preop UA negative.  Blood group type O positive.  Nasal swabs were negative for Staph aureus and negative for MRSA.  Serial CBCs were followed.  Hemoglobin dropped down to 11 and 9.7.  Last noted hemoglobin and hematocrit 9.0 and 28.1.  Serial BMETs were followed for 48 hours.  Sodium dropped from 141 to 134, back up to 135.   Remaining electrolytes remained within normal limits.  X-rays, 2 view chest, January 31, 2011, no evidence of acute cardiopulmonary disease.  EKG, January 31, 2011, normal sinus rhythm and normal EKG confirmed by Dr. Jacinto Halim.  HOSPITAL COURSE:  The patient admitted to [facility], taken to OR, underwent the above-stated procedure without complication.  The patient tolerated the procedure well, later transferred to recovery room in the orthopedic floor.  She was given 24 hours postop IV antibiotics, started on p.o. and IV analgesics for pain control.  Did have some pain through the night but doing pretty well on the morning of day 1.  She wanted to look into skilled facility, considered [facility], so we got social work involved.  Her blood pressure medications and her other home medications were restarted.  We did hold her lisinopril initially. Following anesthesia, her blood pressure was more normal.  Did not want to have any hypotensive episodes.  Her sodium was a little low, felt to be a dilutional component.  She had good urine output, so we figured she would come straight back up on her own, so we decreased the fluids.  By day 2, her sodium was already back up to normal level.  Her hemoglobin was down to 9.7.  She was asymptomatic with this, started getting up out of bed on day 1 and by  day 2 she had started doing little bit of walking, getting up about 10-15 feet.  Dressing was changed on day 2. Incision looked good.  No signs of infection.  Social work was working with her.  By day 3, she was seen on rounds by Dr. Lequita Halt.  She was doing well.  No complaints.  We did find out that bed did become once she was accepted at [facility].  She was doing well.  We transferred her later that day.  DISCHARGE PLAN: 1. The patient will be transferred to [facility] on February 16, 2011. 2. Discharge diagnoses, please see above.  DISCHARGE MEDICATIONS:  Current medications at the  time of transfer include: 1. Xarelto 10 mg p.o. daily for 3 weeks, then discontinue the Xarelto. 2. Colace 100 mg p.o. b.i.d. 3. Simvastatin 80 mg p.o. q.a.m. 4. Ativan 1 mg 1/2 tablet 4 times a day scheduled. 5. Sertraline 50 mg p.o. q.a.m. 6. Abilify 2 mg every morning. 7. Effexor 37.5 mg twice a day. 8. Requip 1 mg at bedtime. 9. Metformin 500 mg twice a day. 10.Nu-Iron 150 mg tablet p.o. daily for 3 weeks, then discontinue the     Nu-Iron. 11.Robaxin 500 mg p.o. q.6 h. p.r.n. spasm. 12.Restoril 15-30 mg p.o. at bedtime p.r.n. sleep. 13.OxyIR 5 mg 1 or 2 every 4-6 hours as needed for moderate pain. 14.Tylenol 325 mg 1 or 2 every 4-6 hours as needed for mild pain,     temperature or headache. 15.Ranitidine over-the-counter p.o. b.i.d. p.r.n.  DIET:  Diabetic diet.  ACTIVITY:  Weight bearing as tolerated, left lower extremity.  PT and OT for gait training, ambulation, ADLs, range of motion, and exercises. Please utilize the CPM, continuous passive motion machine.  Have her do anywhere from 4-6 hours each day but only 2-hour sessions.  Do not exceed 2-3 hours in the machine at any one given time.  Please note the patient may start showering; however, do not submerge the incision under water.  Daily dressing changes.  FOLLOWUP:  The patient is to follow up with Dr. Lequita Halt in the office on August 14, Tuesday or August 16, Thursday.  Please call the office at [phone] to help arrange appointment and followup care.  DISPOSITION:  [facility].  CONDITION UPON DISCHARGE:  Improving.     Alexzandrew L. Julien Girt, P.A.C.   ______________________________ Ollen Gross, M.D.    ALP/MEDQ  D:  02/16/2011  T:  02/16/2011  Job:  [id number]  Electronically Signed by Patrica Duel P.A.C. on 02/17/2011 09:42:32 AM Electronically Signed by Ollen Gross M.D. on 02/27/2011 11:37:15 AM

## 2011-03-29 ENCOUNTER — Ambulatory Visit
Admission: RE | Admit: 2011-03-29 | Discharge: 2011-03-29 | Disposition: A | Discharge status: Home / Self Care | Payer: Medicare Other | Source: Ambulatory Visit | Attending: Family Medicine | Admitting: Family Medicine

## 2011-03-29 DIAGNOSIS — Z1231 Encounter for screening mammogram for malignant neoplasm of breast: Secondary | ICD-10-CM

## 2011-04-10 LAB — CBC
Platelets: 312
RDW: 13.3
WBC: 12.1 — ABNORMAL HIGH

## 2011-04-10 LAB — DIFFERENTIAL
Basophils Absolute: 0.1
Eosinophils Relative: 1
Lymphocytes Relative: 29
Lymphs Abs: 3.5
Neutro Abs: 7.4
Neutrophils Relative %: 62

## 2011-04-10 LAB — I-STAT 8, (EC8 V) (CONVERTED LAB)
Acid-Base Excess: 3 — ABNORMAL HIGH
Bicarbonate: 27.1 — ABNORMAL HIGH
Chloride: 104
HCT: 44
Operator id: 151321
pCO2, Ven: 40.6 — ABNORMAL LOW
pH, Ven: 7.432 — ABNORMAL HIGH

## 2011-04-10 LAB — POCT I-STAT CREATININE
Creatinine, Ser: 0.8
Operator id: 151321

## 2011-04-10 LAB — POCT CARDIAC MARKERS
CKMB, poc: <1 — ABNORMAL LOW
Myoglobin, poc: 38.4
Operator id: 151321
Troponin i, poc: <0.05

## 2011-11-29 ENCOUNTER — Encounter (HOSPITAL_COMMUNITY)
Admission: RE | Admit: 2011-11-29 | Discharge: 2011-11-29 | Disposition: A | Discharge status: Home / Self Care | Payer: Medicare Other | Source: Ambulatory Visit | Attending: Orthopedic Surgery | Admitting: Orthopedic Surgery

## 2011-11-29 ENCOUNTER — Encounter (HOSPITAL_COMMUNITY): Payer: Self-pay

## 2011-11-29 ENCOUNTER — Encounter (HOSPITAL_COMMUNITY): Payer: Self-pay | Admitting: Respiratory Therapy

## 2011-11-29 HISTORY — DX: Depression, unspecified: F32.A

## 2011-11-29 HISTORY — DX: Cerebral infarction, unspecified: I63.9

## 2011-11-29 HISTORY — DX: Anxiety disorder, unspecified: F41.9

## 2011-11-29 HISTORY — DX: Major depressive disorder, single episode, unspecified: F32.9

## 2011-11-29 HISTORY — DX: Essential (primary) hypertension: I10

## 2011-11-29 HISTORY — DX: Headache: R51

## 2011-11-29 LAB — BASIC METABOLIC PANEL
CO2: 27 mEq/L (ref 19–32)
Calcium: 10 mg/dL (ref 8.4–10.5)
GFR calc Af Amer: >90 mL/min (ref >90)
GFR calc non Af Amer: 87 mL/min — ABNORMAL LOW (ref >90)
Sodium: 142 mEq/L (ref 135–145)

## 2011-11-29 LAB — TYPE AND SCREEN
ABO/RH(D): O POS
Antibody Screen: NEGATIVE

## 2011-11-29 LAB — CBC
MCV: 93.2 fL (ref 78.0–100.0)
Platelets: 247 10*3/uL (ref 150–400)
RBC: 4.29 MIL/uL (ref 3.87–5.11)
WBC: 8 10*3/uL (ref 4.0–10.5)

## 2011-11-29 LAB — SURGICAL PCR SCREEN: MRSA, PCR: NEGATIVE

## 2011-11-29 MED ORDER — CHLORHEXIDINE GLUCONATE 4 % EX LIQD: 60.0000 mL | Freq: Once | CUTANEOUS | Status: DC

## 2011-11-29 NOTE — Pre-Procedure Instructions (Signed)
20 Martha Olsen  11/29/2011   Your procedure is scheduled on:  Thursday   May 23rd@7 :30AM.  Report to [facility] at 5:30 AM.  Call this number if you have problems the morning of surgery: [phone]   Remember:   Do not eat food:After Midnight.  May have clear liquids: up to 4 Hours before arrival( nothing after 1:30AM).  Clear liquids include soda, tea, black coffee, apple or grape juice, broth.  Take these medicines the morning of surgery with A SIP OF WATER: Abilify, Buspar, Ativan, Zoloft.   Do not wear jewelry, make-up or nail polish.  Do not wear lotions, powders, or perfumes. You may wear deodorant.  Do not shave 48 hours prior to surgery. Men may shave face and neck.  Do not bring valuables to the hospital.  Contacts, dentures or bridgework may not be worn into surgery.  Leave suitcase in the car. After surgery it may be brought to your room.  For patients admitted to the hospital, checkout time is 11:00 AM the day of discharge.   Patients discharged the day of surgery will not be allowed to drive home.  Name and phone number of your driver:  Special Instructions: CHG Shower Use Special Wash: 1/2 bottle night before surgery and 1/2 bottle morning of surgery.   Please read over the following fact sheets that you were given: Pain Booklet, Coughing and Deep Breathing and Surgical Site Infection Prevention

## 2011-11-29 NOTE — Progress Notes (Signed)
Pt here for PAT appt.  Reports her PCP: Dr. [name] [phone], [phone]).  Denies sleep apnea, cardiologist.  Instructed on how to use IS.  Pt verbalized understanding and demonstrated back on using IS.//L. Nikeia Henkes,RN

## 2011-12-06 MED ORDER — LACTATED RINGERS IV SOLN: INTRAVENOUS | Status: DC

## 2011-12-06 MED ORDER — CEFAZOLIN SODIUM-DEXTROSE 2-3 GM-% IV SOLR
2.0000 g | INTRAVENOUS | Status: AC
  Administered 2011-12-07: 2 g via INTRAVENOUS
  Filled 2011-12-06: qty 50

## 2011-12-07 ENCOUNTER — Ambulatory Visit (HOSPITAL_COMMUNITY): Payer: Medicare Other | Admitting: Anesthesiology

## 2011-12-07 ENCOUNTER — Encounter (HOSPITAL_COMMUNITY): Payer: Self-pay | Admitting: Anesthesiology

## 2011-12-07 ENCOUNTER — Encounter (HOSPITAL_COMMUNITY): Payer: Self-pay

## 2011-12-07 ENCOUNTER — Encounter (HOSPITAL_COMMUNITY): Payer: Self-pay | Admitting: *Deleted

## 2011-12-07 ENCOUNTER — Inpatient Hospital Stay (HOSPITAL_COMMUNITY)
Admission: RE | Admit: 2011-12-07 | Discharge: 2011-12-08 | DRG: 484 | Disposition: A | Discharge status: Home Health | Payer: Medicare Other | Source: Ambulatory Visit | Attending: Orthopedic Surgery | Admitting: Orthopedic Surgery

## 2011-12-07 ENCOUNTER — Encounter (HOSPITAL_COMMUNITY)
Admission: RE | Disposition: A | Discharge status: Home Health | Payer: Self-pay | Source: Ambulatory Visit | Attending: Orthopedic Surgery

## 2011-12-07 DIAGNOSIS — M19019 Primary osteoarthritis, unspecified shoulder: Principal | ICD-10-CM | POA: Diagnosis present

## 2011-12-07 DIAGNOSIS — Z96649 Presence of unspecified artificial hip joint: Secondary | ICD-10-CM

## 2011-12-07 DIAGNOSIS — Z96659 Presence of unspecified artificial knee joint: Secondary | ICD-10-CM

## 2011-12-07 DIAGNOSIS — M12812 Other specific arthropathies, not elsewhere classified, left shoulder: Secondary | ICD-10-CM

## 2011-12-07 DIAGNOSIS — F3289 Other specified depressive episodes: Secondary | ICD-10-CM | POA: Diagnosis present

## 2011-12-07 DIAGNOSIS — I10 Essential (primary) hypertension: Secondary | ICD-10-CM | POA: Diagnosis present

## 2011-12-07 DIAGNOSIS — E119 Type 2 diabetes mellitus without complications: Secondary | ICD-10-CM | POA: Diagnosis present

## 2011-12-07 DIAGNOSIS — F411 Generalized anxiety disorder: Secondary | ICD-10-CM | POA: Diagnosis present

## 2011-12-07 DIAGNOSIS — Z8673 Personal history of transient ischemic attack (TIA), and cerebral infarction without residual deficits: Secondary | ICD-10-CM

## 2011-12-07 DIAGNOSIS — F329 Major depressive disorder, single episode, unspecified: Secondary | ICD-10-CM | POA: Diagnosis present

## 2011-12-07 HISTORY — PX: REVERSE SHOULDER ARTHROPLASTY: SHX5054

## 2011-12-07 LAB — GLUCOSE, CAPILLARY
Glucose-Capillary: 132 mg/dL — ABNORMAL HIGH (ref 70–99)
Glucose-Capillary: 127 mg/dL — ABNORMAL HIGH (ref 70–99)
Glucose-Capillary: 146 mg/dL — ABNORMAL HIGH (ref 70–99)

## 2011-12-07 SURGERY — ARTHROPLASTY, SHOULDER, TOTAL, REVERSE
Anesthesia: General | Site: Shoulder | Laterality: Left | Wound class: Clean

## 2011-12-07 MED ORDER — MIDAZOLAM HCL 2 MG/2ML IJ SOLN: 1.0000 mg | INTRAMUSCULAR | Status: DC | PRN

## 2011-12-07 MED ORDER — ONDANSETRON HCL 4 MG/2ML IJ SOLN: 4.0000 mg | Freq: Four times a day (QID) | INTRAMUSCULAR | Status: DC | PRN

## 2011-12-07 MED ORDER — ROCURONIUM BROMIDE 100 MG/10ML IV SOLN
INTRAVENOUS | Status: DC | PRN
  Administered 2011-12-07: 50 mg via INTRAVENOUS

## 2011-12-07 MED ORDER — ATORVASTATIN CALCIUM 40 MG PO TABS
40.0000 mg | ORAL_TABLET | Freq: Every day | ORAL | Status: DC
  Administered 2011-12-07: 40 mg via ORAL
  Filled 2011-12-07 (×2): qty 1

## 2011-12-07 MED ORDER — CEFAZOLIN SODIUM 1-5 GM-% IV SOLN
1.0000 g | Freq: Four times a day (QID) | INTRAVENOUS | Status: AC
  Administered 2011-12-07 – 2011-12-08 (×3): 1 g via INTRAVENOUS
  Filled 2011-12-07 (×3): qty 50

## 2011-12-07 MED ORDER — HYDROMORPHONE HCL PF 1 MG/ML IJ SOLN: 0.5000 mg | INTRAMUSCULAR | Status: DC | PRN

## 2011-12-07 MED ORDER — BUSPIRONE HCL 15 MG PO TABS
15.0000 mg | ORAL_TABLET | Freq: Three times a day (TID) | ORAL | Status: DC
  Administered 2011-12-07 – 2011-12-08 (×3): 15 mg via ORAL
  Filled 2011-12-07 (×5): qty 1

## 2011-12-07 MED ORDER — OXYCODONE-ACETAMINOPHEN 5-325 MG PO TABS
1.0000 | ORAL_TABLET | ORAL | Status: DC | PRN
  Administered 2011-12-07 (×2): 2 via ORAL
  Administered 2011-12-08: 1 via ORAL
  Administered 2011-12-08: 2 via ORAL
  Filled 2011-12-07: qty 1
  Filled 2011-12-07 (×3): qty 2

## 2011-12-07 MED ORDER — LORAZEPAM 0.5 MG PO TABS
0.5000 mg | ORAL_TABLET | Freq: Four times a day (QID) | ORAL | Status: DC
  Administered 2011-12-07 – 2011-12-08 (×2): 0.5 mg via ORAL
  Filled 2011-12-07 (×3): qty 1

## 2011-12-07 MED ORDER — FENTANYL CITRATE 0.05 MG/ML IJ SOLN
INTRAMUSCULAR | Status: DC | PRN
  Administered 2011-12-07 (×2): 50 ug via INTRAVENOUS

## 2011-12-07 MED ORDER — LACTATED RINGERS IV SOLN
INTRAVENOUS | Status: DC
  Administered 2011-12-07: 19:00:00 via INTRAVENOUS

## 2011-12-07 MED ORDER — MENTHOL 3 MG MT LOZG: 1.0000 | LOZENGE | OROMUCOSAL | Status: DC | PRN

## 2011-12-07 MED ORDER — PHENYLEPHRINE HCL 10 MG/ML IJ SOLN
INTRAMUSCULAR | Status: DC | PRN
  Administered 2011-12-07: 120 ug via INTRAVENOUS
  Administered 2011-12-07: 80 ug via INTRAVENOUS
  Administered 2011-12-07: 40 ug via INTRAVENOUS
  Administered 2011-12-07: 80 ug via INTRAVENOUS

## 2011-12-07 MED ORDER — PROPOFOL 10 MG/ML IV EMUL
INTRAVENOUS | Status: DC | PRN
  Administered 2011-12-07: 50 mg via INTRAVENOUS
  Administered 2011-12-07: 120 mg via INTRAVENOUS

## 2011-12-07 MED ORDER — ARIPIPRAZOLE 2 MG PO TABS
2.0000 mg | ORAL_TABLET | Freq: Every day | ORAL | Status: DC
  Administered 2011-12-08: 2 mg via ORAL
  Filled 2011-12-07: qty 1

## 2011-12-07 MED ORDER — PHENYLEPHRINE HCL 10 MG/ML IJ SOLN
10.0000 mg | INTRAVENOUS | Status: DC | PRN
  Administered 2011-12-07: 10 ug/min via INTRAVENOUS

## 2011-12-07 MED ORDER — ACETAMINOPHEN 325 MG PO TABS: 650.0000 mg | ORAL_TABLET | Freq: Four times a day (QID) | ORAL | Status: DC | PRN

## 2011-12-07 MED ORDER — GLYCOPYRROLATE 0.2 MG/ML IJ SOLN
INTRAMUSCULAR | Status: DC | PRN
  Administered 2011-12-07: 0.6 mg via INTRAVENOUS

## 2011-12-07 MED ORDER — ROPINIROLE HCL 1 MG PO TABS
2.0000 mg | ORAL_TABLET | Freq: Every day | ORAL | Status: DC
Start: 2011-12-07 — End: 2011-12-08
  Administered 2011-12-07: 2 mg via ORAL
  Filled 2011-12-07 (×2): qty 2

## 2011-12-07 MED ORDER — BUPIVACAINE-EPINEPHRINE PF 0.5-1:200000 % IJ SOLN
INTRAMUSCULAR | Status: DC | PRN
  Administered 2011-12-07: 25 mL

## 2011-12-07 MED ORDER — DIPHENHYDRAMINE HCL 12.5 MG/5ML PO ELIX
12.5000 mg | ORAL_SOLUTION | ORAL | Status: DC | PRN
Start: 2011-12-07 — End: 2011-12-08

## 2011-12-07 MED ORDER — LISINOPRIL 20 MG PO TABS
20.0000 mg | ORAL_TABLET | Freq: Every day | ORAL | Status: DC
  Filled 2011-12-07: qty 1

## 2011-12-07 MED ORDER — ADULT MULTIVITAMIN W/MINERALS CH
1.0000 | ORAL_TABLET | Freq: Every day | ORAL | Status: DC
  Administered 2011-12-07 – 2011-12-08 (×2): 1 via ORAL
  Filled 2011-12-07 (×2): qty 1

## 2011-12-07 MED ORDER — NEOSTIGMINE METHYLSULFATE 1 MG/ML IJ SOLN
INTRAMUSCULAR | Status: DC | PRN
  Administered 2011-12-07: 4 mg via INTRAVENOUS

## 2011-12-07 MED ORDER — KETOROLAC TROMETHAMINE 15 MG/ML IJ SOLN
15.0000 mg | Freq: Four times a day (QID) | INTRAMUSCULAR | Status: DC
  Administered 2011-12-07 – 2011-12-08 (×4): 15 mg via INTRAVENOUS
  Filled 2011-12-07 (×8): qty 1

## 2011-12-07 MED ORDER — METOCLOPRAMIDE HCL 5 MG/ML IJ SOLN: 5.0000 mg | Freq: Three times a day (TID) | INTRAMUSCULAR | Status: DC | PRN

## 2011-12-07 MED ORDER — ONDANSETRON HCL 4 MG PO TABS: 4.0000 mg | ORAL_TABLET | Freq: Four times a day (QID) | ORAL | Status: DC | PRN

## 2011-12-07 MED ORDER — LORAZEPAM 2 MG/ML IJ SOLN: 1.0000 mg | Freq: Once | INTRAMUSCULAR | Status: DC | PRN

## 2011-12-07 MED ORDER — PHENOL 1.4 % MT LIQD: 1.0000 | OROMUCOSAL | Status: DC | PRN

## 2011-12-07 MED ORDER — LACTATED RINGERS IV SOLN
INTRAVENOUS | Status: DC | PRN
  Administered 2011-12-07 (×2): via INTRAVENOUS

## 2011-12-07 MED ORDER — ARIPIPRAZOLE 2 MG PO TABS
2.0000 mg | ORAL_TABLET | Freq: Every day | ORAL | Status: DC
  Filled 2011-12-07: qty 1

## 2011-12-07 MED ORDER — MIDAZOLAM HCL 5 MG/5ML IJ SOLN
INTRAMUSCULAR | Status: DC | PRN
  Administered 2011-12-07 (×2): 1 mg via INTRAVENOUS

## 2011-12-07 MED ORDER — LISINOPRIL 20 MG PO TABS
20.0000 mg | ORAL_TABLET | Freq: Every day | ORAL | Status: DC
  Administered 2011-12-08: 20 mg via ORAL
  Filled 2011-12-07: qty 1

## 2011-12-07 MED ORDER — METOCLOPRAMIDE HCL 10 MG PO TABS: 5.0000 mg | ORAL_TABLET | Freq: Three times a day (TID) | ORAL | Status: DC | PRN

## 2011-12-07 MED ORDER — METHOCARBAMOL 500 MG PO TABS
500.0000 mg | ORAL_TABLET | Freq: Four times a day (QID) | ORAL | Status: DC | PRN
  Administered 2011-12-07 – 2011-12-08 (×3): 500 mg via ORAL
  Filled 2011-12-07 (×3): qty 1

## 2011-12-07 MED ORDER — DEXAMETHASONE SODIUM PHOSPHATE 4 MG/ML IJ SOLN
INTRAMUSCULAR | Status: DC | PRN
  Administered 2011-12-07: 4 mg

## 2011-12-07 MED ORDER — LIDOCAINE HCL (CARDIAC) 20 MG/ML IV SOLN
INTRAVENOUS | Status: DC | PRN
  Administered 2011-12-07: 50 mg via INTRAVENOUS

## 2011-12-07 MED ORDER — ONDANSETRON HCL 4 MG/2ML IJ SOLN
INTRAMUSCULAR | Status: DC | PRN
  Administered 2011-12-07: 4 mg via INTRAVENOUS

## 2011-12-07 MED ORDER — ACETAMINOPHEN 650 MG RE SUPP: 650.0000 mg | Freq: Four times a day (QID) | RECTAL | Status: DC | PRN

## 2011-12-07 MED ORDER — SODIUM CHLORIDE 0.9 % IR SOLN
Status: DC | PRN
  Administered 2011-12-07: 1000 mL

## 2011-12-07 MED ORDER — SERTRALINE HCL 25 MG PO TABS
25.0000 mg | ORAL_TABLET | Freq: Every day | ORAL | Status: DC
Start: 2011-12-08 — End: 2011-12-08
  Administered 2011-12-08: 25 mg via ORAL
  Filled 2011-12-07: qty 1

## 2011-12-07 MED ORDER — FENTANYL CITRATE 0.05 MG/ML IJ SOLN: 50.0000 ug | INTRAMUSCULAR | Status: DC | PRN

## 2011-12-07 MED ORDER — HYDROMORPHONE HCL PF 1 MG/ML IJ SOLN: 0.2500 mg | INTRAMUSCULAR | Status: DC | PRN

## 2011-12-07 MED ORDER — METHOCARBAMOL 100 MG/ML IJ SOLN
500.0000 mg | Freq: Four times a day (QID) | INTRAVENOUS | Status: DC | PRN
  Filled 2011-12-07: qty 5

## 2011-12-07 SURGICAL SUPPLY — 61 items
BLADE SAW SGTL 83.5X18.5 (BLADE) ×2 IMPLANT
BRUSH FEMORAL CANAL (MISCELLANEOUS) IMPLANT
CEMENT BONE DEPUY (Cement) ×4 IMPLANT
CEMENT RESTRICTOR DEPUY SZ 1 (Cement) ×2 IMPLANT
CLOTH BEACON ORANGE TIMEOUT ST (SAFETY) ×2 IMPLANT
CLSR STERI-STRIP ANTIMIC 1/2X4 (GAUZE/BANDAGES/DRESSINGS) ×2 IMPLANT
COVER SURGICAL LIGHT HANDLE (MISCELLANEOUS) ×2 IMPLANT
DRAPE INCISE IOBAN 66X45 STRL (DRAPES) ×2 IMPLANT
DRAPE SURG 17X11 SM STRL (DRAPES) ×2 IMPLANT
DRAPE U-SHAPE 47X51 STRL (DRAPES) ×2 IMPLANT
DRILL BIT 7/64X5 (BIT) IMPLANT
DRSG MEPILEX BORDER 4X8 (GAUZE/BANDAGES/DRESSINGS) IMPLANT
DURAPREP 26ML APPLICATOR (WOUND CARE) ×2 IMPLANT
ELECT BLADE 4.0 EZ CLEAN MEGAD (MISCELLANEOUS)
ELECT CAUTERY BLADE 6.4 (BLADE) ×2 IMPLANT
ELECT REM PT RETURN 9FT ADLT (ELECTROSURGICAL) ×2
ELECTRODE BLDE 4.0 EZ CLN MEGD (MISCELLANEOUS) IMPLANT
ELECTRODE REM PT RTRN 9FT ADLT (ELECTROSURGICAL) ×1 IMPLANT
FACESHIELD LNG OPTICON STERILE (SAFETY) ×6 IMPLANT
GLOVE BIO SURGEON STRL SZ7.5 (GLOVE) ×2 IMPLANT
GLOVE BIO SURGEON STRL SZ8 (GLOVE) ×2 IMPLANT
GLOVE EUDERMIC 7 POWDERFREE (GLOVE) ×2 IMPLANT
GLOVE SS BIOGEL STRL SZ 7.5 (GLOVE) ×1 IMPLANT
GLOVE SUPERSENSE BIOGEL SZ 7.5 (GLOVE) ×1
GOWN STRL NON-REIN LRG LVL3 (GOWN DISPOSABLE) IMPLANT
GOWN STRL REIN XL XLG (GOWN DISPOSABLE) IMPLANT
HANDPIECE INTERPULSE COAX TIP (DISPOSABLE)
KIT BASIN OR (CUSTOM PROCEDURE TRAY) ×2 IMPLANT
KIT ROOM TURNOVER OR (KITS) ×2 IMPLANT
MANIFOLD NEPTUNE II (INSTRUMENTS) ×2 IMPLANT
NDL SUT 6 .5 CRC .975X.05 MAYO (NEEDLE) ×1 IMPLANT
NEEDLE HYPO 25GX1X1/2 BEV (NEEDLE) IMPLANT
NEEDLE MAYO TAPER (NEEDLE) ×2
NS IRRIG 1000ML POUR BTL (IV SOLUTION) ×2 IMPLANT
PACK SHOULDER (CUSTOM PROCEDURE TRAY) ×2 IMPLANT
PAD ARMBOARD 7.5X6 YLW CONV (MISCELLANEOUS) ×4 IMPLANT
PASSER SUT SWANSON 36MM LOOP (INSTRUMENTS) IMPLANT
PRESSURIZER FEMORAL UNIV (MISCELLANEOUS) IMPLANT
SET HNDPC FAN SPRY TIP SCT (DISPOSABLE) IMPLANT
SLING ARM IMMOBILIZER LRG (SOFTGOODS) IMPLANT
SPONGE LAP 18X18 X RAY DECT (DISPOSABLE) ×4 IMPLANT
SPONGE LAP 4X18 X RAY DECT (DISPOSABLE) IMPLANT
STRIP CLOSURE SKIN 1/2X4 (GAUZE/BANDAGES/DRESSINGS) ×2 IMPLANT
SUCTION FRAZIER TIP 10 FR DISP (SUCTIONS) ×2 IMPLANT
SUT BONE WAX W31G (SUTURE) IMPLANT
SUT FIBERWIRE #2 38 T-5 BLUE (SUTURE) ×6
SUT MNCRL AB 3-0 PS2 18 (SUTURE) ×2 IMPLANT
SUT VIC AB 1 CT1 27 (SUTURE) ×2
SUT VIC AB 1 CT1 27XBRD ANBCTR (SUTURE) ×1 IMPLANT
SUT VIC AB 2-0 CT1 27 (SUTURE) ×4
SUT VIC AB 2-0 CT1 TAPERPNT 27 (SUTURE) ×2 IMPLANT
SUT VIC AB 2-0 SH 27 (SUTURE)
SUT VIC AB 2-0 SH 27X BRD (SUTURE) IMPLANT
SUTURE FIBERWR #2 38 T-5 BLUE (SUTURE) ×3 IMPLANT
SYR 30ML SLIP (SYRINGE) IMPLANT
SYR CONTROL 10ML LL (SYRINGE) IMPLANT
TOWEL OR 17X24 6PK STRL BLUE (TOWEL DISPOSABLE) IMPLANT
TOWEL OR 17X26 10 PK STRL BLUE (TOWEL DISPOSABLE) IMPLANT
TOWER CARTRIDGE SMART MIX (DISPOSABLE) ×2 IMPLANT
TRAY FOLEY CATH 14FR (SET/KITS/TRAYS/PACK) IMPLANT
WATER STERILE IRR 1000ML POUR (IV SOLUTION) ×2 IMPLANT

## 2011-12-07 NOTE — Op Note (Signed)
Martha Olsen, Martha Olsen               ACCOUNT NO.:  [id number]  MEDICAL RECORD NO.:  [id number]  LOCATION:  [facility]                         FACILITY:  [facility]  PHYSICIAN:  Vania Rea. Burhan Barham, M.D.  DATE OF BIRTH:  06-15-1936  DATE OF PROCEDURE:  12/07/2011 DATE OF DISCHARGE:                              OPERATIVE REPORT   PREOPERATIVE DIAGNOSIS:  Left shoulder end-stage rotator cuff tear arthropathy.  POSTOPERATIVE DIAGNOSIS:  Left shoulder end-stage rotator cuff tear arthropathy.  PROCEDURE:  Left shoulder reverse arthroplasty utilizing a cemented size 8 DePuy stem, +3 poly, and a 38 eccentric glenoid sphere.  SURGEON:  Vania Rea. Danyel Griess, MD.  Threasa HeadsLucita Lora. Shuford, PA-C.  ANESTHESIA:  General endotracheal as well as an interscalene block.  ESTIMATED BLOOD LOSS:  200 mL.  DRAINS:  None.  HISTORY:  Martha Olsen is a 76 year old female who has had chronic progressive increasing left shoulder pain related to an end-stage rotator cuff tear arthropathy.  She is brought to the operating room at this time for planned left reverse shoulder arthroplasty.  Preoperatively, I counseled Martha Olsen on treatment options as well as risks versus benefits thereof.  Possible surgical complications were reviewed including potential for bleeding, infection, neurovascular injury, persistent pain, loss of motion, failure of the implant, anesthesia complication, and possible need for additional surgery.  She understands and accepts and agrees with our planned procedure.  PROCEDURE IN DETAIL:  After undergoing routine preop evaluation, the patient received prophylactic antibiotics, and an interscalene block was established in the holding area by the Anesthesia Department.  Placed supine on the op table, underwent smooth induction of a general endotracheal anesthesia.  Placed in beach-chair position and appropriately padded and protected.  Left shoulder girdle region was then sterilely prepped and  draped in standard fashion.  Time-out was called.  An anterior deltopectoral approach to the left shoulder was made through a 12-cm incision, beginning at the coracoid, extending laterally and distally.  Skin flaps were elevated.  Electrocautery was used for hemostasis.  The cephalic vein was identified and mobilized and retracted laterally with the deltoid.  Deltopectoral interval was developed and an upper centimeter of the pec major was tenotomized to improve exposure.  We divided adhesions in the subacromial/subdeltoid bursa.  Conjoined tendon was then identified, mobilized and retracted medially, and self-retaining retractors then placed.  There was complete absence of the superior half of the rotator cuff.  We divided the subscapularis and retracted this medially and then divided capsular attachments from the inferior aspect of the humeral head back posteriorly to allow complete delivery of the humeral head through the wound.  We then used a rongeur to create a starting point for the humeral medullary reamings and then reamed the humeral canal by hand up to size 8, which was quite tight fit with a very small canal diameter. We then used the humeral guide to determine the appropriate resection point for the humeral head osteotomy and this was then completed utilizing an oscillating saw at approximately neutral retroversion.  A metal-cap was then placed across the cut surface of the proximal humerus and we then exposed the glenoid with a series of [place], pitchfork and snake  tongue retractors.  I performed a circumferential capsular release and removed residual labrum and gained circumferential visualization of the glenoid.  A central guidepin was then placed and we reamed it for 38 and a smooth subchondral bony bed was obtained.  The central peg hole was then drilled.  The glenoid was irrigated.  We impacted the glenoid base plate and then fastened this with inferior and superior  locking screws and a posterior nonlocking screw.  The anterior hole did not have bony coverage.  Bony fixation, however, was excellent and screws placed. We then placed a 38 eccentric glenoid sphere onto the base plate and tightened this using standard technique with excellent fixation.  Our attention then returned to the proximal humerus where the trial stem was placed and the size 1 reamer was then used to create the metaphyseal reaming.  We placed the trial again with 0 degrees retroversion and initially, the soft tissue envelope was a bit tight so we seated the reaming broach slightly deeper and re-reamed the metaphyseal aspect of the humerus another 2-3 mm and then once this was completed, the trial reduction showed excellent soft tissue balance with the +3 poly.  At this point, we then placed a distal cement plug at the humerus.  The humeral canal was irrigated.  Cement was then mixed in the appropriate consistency.  Cement was introduced into the humeral medullary canal, pressurized and then the final stem was placed to the appropriate depth with 0 degrees retroversion.  We removed all extra cement and this cement had hardened appropriately, we then performed trial reduction, showing good soft tissue balance, good position of the implant, excellent shoulder motion.  We then placed the final +3 poly.  We performed final reduction, again the overall soft tissue balance and mobility was much to my satisfaction.  The joint was then copiously irrigated.  All residual bony debris was removed.  The deltopectoral interval was then reapproximated with 0 Vicryl.  2-0 Vicryl was used for the subcu layer and intracuticular 3-0 Monocryl for the skin, followed by Steri-Strips.  Dry dressing was placed on the left shoulder.  Left arm was placed in a sling.  The patient was awakened, extubated, and taken to the recovery room in stable condition.     Vania Rea. Annel Zunker, M.D.     KMS/MEDQ  D:   12/07/2011  T:  12/07/2011  Job:  [id number]

## 2011-12-07 NOTE — H&P (Signed)
Martha Olsen    Chief Complaint: OA OF LEFT SHOULDER HPI: The patient is a 76 y.o. female with end stage left shoulder rotator cuff tear artyhropathy  Past Medical History  Diagnosis Date  . Stroke 2002  . Hypertension   . Diabetes mellitus 2011  . Headache   . Anxiety   . Depression     Past Surgical History  Procedure Date  . Foot surgery   . Cholecystectomy 1993  . Tonsillectomy 1951  . Eye surgery 2009  . Abdominal hysterectomy 1980's  . Appendectomy 1980's  . Joint replacement 2012    Hip, Knee    Family History  Problem Relation Age of Onset  . Anesthesia problems Neg Hx     Social History:  does not have a smoking history on file. She does not have any smokeless tobacco history on file. She reports that she does not drink alcohol. Her drug history not on file.  Allergies:  Allergies  Allergen Reactions  . Codeine Hives    Medications Prior to Admission  Medication Sig Dispense Refill  . ARIPiprazole (ABILIFY) 2 MG tablet Take 2 mg by mouth daily.      . busPIRone (BUSPAR) 15 MG tablet Take 15 mg by mouth 3 (three) times daily.      Marland Kitchen lisinopril (PRINIVIL,ZESTRIL) 20 MG tablet Take 20 mg by mouth daily.      Marland Kitchen LORazepam (ATIVAN) 1 MG tablet Take 0.5 mg by mouth 4 (four) times daily.      . metFORMIN (GLUCOPHAGE) 500 MG tablet Take 500 mg by mouth 2 (two) times daily with a meal.      . rOPINIRole (REQUIP) 2 MG tablet Take 2 mg by mouth at bedtime.      . sertraline (ZOLOFT) 50 MG tablet Take 25 mg by mouth daily.      . simvastatin (ZOCOR) 80 MG tablet Take 80 mg by mouth at bedtime.      . Multiple Vitamin (MULITIVITAMIN WITH MINERALS) TABS Take 1 tablet by mouth daily.      . Omega-3 Fatty Acids (FISH OIL) 1200 MG CAPS Take 2,400 mg by mouth 2 (two) times daily.         Physical Exam: Left shoulder with painful and restricted ROM as documented at 11/22/11 office visit  Vitals  Temp:  [98.3 F (36.8 C)] 98.3 F (36.8 C) (05/23 0607) Pulse Rate:   [96] 96  (05/23 0607) Resp:  [18] 18  (05/23 0607) BP: (126)/(72) 126/72 mmHg (05/23 0607) SpO2:  [96 %] 96 % (05/23 0607)  Assessment/Plan  Impression: OA OF LEFT SHOULDER  Plan of Action: Procedure(s): REVERSE SHOULDER ARTHROPLASTY  Shandon Burlingame M 12/07/2011, 7:34 AM

## 2011-12-07 NOTE — Progress Notes (Signed)
Pt. Took metformin & Lisinopril this a.m., although hold to hold meds in preadmit interview

## 2011-12-07 NOTE — Preoperative (Signed)
Beta Blockers   Reason not to administer Beta Blockers:Not Applicable 

## 2011-12-07 NOTE — Anesthesia Procedure Notes (Addendum)
Anesthesia Regional Block:  Interscalene brachial plexus block  Pre-Anesthetic Checklist: ,, timeout performed, Correct Patient, Correct Site, Correct Laterality, Correct Procedure, Correct Position, site marked, Risks and benefits discussed,  Surgical consent,  Pre-op evaluation,  At surgeon's request and post-op pain management  Laterality: Left  Prep: chloraprep       Needles:  Injection technique: Single-shot  Needle Type: Stimulator Needle - 40      Needle Gauge: 22 and 22 G    Additional Needles:  Procedures: nerve stimulator Interscalene brachial plexus block  Nerve Stimulator or Paresthesia:  Response: 0.48 mA,   Additional Responses:   Narrative:  Start time: 12/07/2011 7:19 AM End time: 12/07/2011 7:28 AM Injection made incrementally with aspirations every 5 mL. Anesthesiologist: Dr Gypsy Balsam  Additional Notes: 2956-2130 L ISB POP CHG prep, sterile tech #22 stim needle w/stim down to .48ma Multiple neg asp Marc .5% w/epi 1:200000 total 25cc+Decsdron 4mg  No compl Dr Gypsy Balsam   Procedure Name: Intubation Date/Time: 12/07/2011 7:47 AM Performed by: Rossie Muskrat L Pre-anesthesia Checklist: Patient identified, Timeout performed, Emergency Drugs available, Suction available and Patient being monitored Patient Re-evaluated:Patient Re-evaluated prior to inductionOxygen Delivery Method: Circle system utilized Preoxygenation: Pre-oxygenation with 100% oxygen Intubation Type: IV induction Ventilation: Mask ventilation without difficulty Laryngoscope Size: Miller and 2 Grade View: Grade I Tube type: Oral Tube size: 7.5 mm Number of attempts: 1 Airway Equipment and Method: Stylet Placement Confirmation: ETT inserted through vocal cords under direct vision,  breath sounds checked- equal and bilateral and positive ETCO2 Secured at: 19 cm Tube secured with: Tape Dental Injury: Teeth and Oropharynx as per pre-operative assessment

## 2011-12-07 NOTE — Evaluation (Signed)
Occupational Therapy Evaluation Patient Details Name: Martha Olsen MRN: [id number] DOB: [birth year] Today's Date: 12/07/2011 Time: 6295-2841 OT Time Calculation (min): 21 min  OT Assessment / Plan / Recommendation Clinical Impression  Pt s/p left rotator cuff repair and presents with below problem list. Will benefit from skilled OT in the acute setting to maximize I with ADL and ADL mobility prior to d/c home    OT Assessment  Patient needs continued OT Services    Follow Up Recommendations   (progress rehab of shoulder per MD)    Barriers to Discharge      Equipment Recommendations  None recommended by OT    Recommendations for Other Services PT consult  Frequency  Min 2X/week    Precautions / Restrictions Precautions Precautions: Shoulder   Pertinent Vitals/Pain Pt c/o 3/10 pain; pre-medicated    ADL  Eating/Feeding: Performed;Set up Where Assessed - Eating/Feeding: Edge of bed Grooming: Performed;Wash/dry face;Set up Where Assessed - Grooming: Unsupported sitting Lower Body Dressing: Performed;Moderate assistance Where Assessed - Lower Body Dressing: Sopported sit to stand Toilet Transfer: Simulated;Minimal assistance (EOB to chair) Toilet Transfer Method: Stand pivot Equipment Used:  (shoulder sling) Transfers/Ambulation Related to ADLs: Min HHA stand-pivot from EOB to chair. Pt used cane PTA.     OT Diagnosis: Generalized weakness;Acute pain  OT Problem List: Decreased strength;Decreased range of motion;Decreased activity tolerance;Impaired balance (sitting and/or standing);Decreased knowledge of use of DME or AE;Decreased knowledge of precautions;Pain;Impaired UE functional use OT Treatment Interventions: Self-care/ADL training;Therapeutic activities;DME and/or AE instruction;Patient/family education;Balance training   OT Goals Acute Rehab OT Goals OT Goal Formulation: With patient Time For Goal Achievement: 12/14/11 Potential to Achieve Goals: Good ADL  Goals Pt Will Perform Grooming: with modified independence;Standing at sink ADL Goal: Grooming - Progress: Goal set today Pt Will Perform Upper Body Dressing: with modified independence;with set-up;Sitting, chair;Sitting, bed ADL Goal: Upper Body Dressing - Progress: Goal set today Pt Will Perform Lower Body Dressing: with min assist;Sit to stand from chair;Sit to stand from bed ADL Goal: Lower Body Dressing - Progress: Goal set today Pt Will Perform Toileting - Clothing Manipulation: with modified independence;Standing ADL Goal: Toileting - Clothing Manipulation - Progress: Goal set today Pt Will Perform Tub/Shower Transfer: Tub transfer;with min assist;Ambulation;with DME;Shower seat with back ADL Goal: Web designer - Progress: Goal set today Additional ADL Goal #1: Pt will I'ly verbalize/demonstrate UB ADL adaptations. ADL Goal: Additional Goal #1 - Progress: Goal set today Additional ADL Goal #2: Pt will I'ly verbalize/generalize sling management. ADL Goal: Additional Goal #2 - Progress: Goal set today Arm Goals Pt Will Perform AROM: Independently;Left upper extremity;10 reps;3 sets Arm Goal: AROM - Progress: Goal set today Pt Will Tolerate PROM: with caregiver independent in performing;Left upper extremity;10 reps Arm Goal: PROM - Progress: Goal set today  Visit Information  Last OT Received On: 12/07/11 Assistance Needed: +1    Subjective Data  Subjective: I'm just hoping this makes me stop hurting Patient Stated Goal: Return home   Prior Functioning  Home Living Lives With: Spouse Available Help at Discharge: Family;Available 24 hours/day Type of Home: House Home Access: Stairs to enter [employer] of Steps: 5 Entrance Stairs-Rails: Right Home Layout: Two level;Bed/bath upstairs Alternate Level Stairs-Number of Steps: 14 Alternate Level Stairs-Rails: Right Bathroom Shower/Tub: Engineer, manufacturing systems: Handicapped height Home Adaptive  Equipment: Bedside commode/3-in-1;Quad cane Additional Comments: pt states after knee sx, she went down the stairs on her bottom Prior Function Level of Independence: Independent Able to Take Stairs?: Reciprically Driving:  No Vocation: Retired Musician: No difficulties Dominant Hand: Right    Cognition  Overall Cognitive Status: Appears within functional limits for tasks assessed/performed Arousal/Alertness: Awake/alert Orientation Level: Appears intact for tasks assessed Behavior During Session: [facility] for tasks performed    Extremity/Trunk Assessment Right Upper Extremity Assessment RUE ROM/Strength/Tone: Within functional levels RUE Sensation: WFL - Light Touch RUE Coordination: WFL - gross/fine motor Left Upper Extremity Assessment LUE ROM/Strength/Tone: Unable to fully assess   Mobility Bed Mobility Bed Mobility: Supine to Sit;Sitting - Scoot to Edge of Bed Supine to Sit: 6: Modified independent (Device/Increase time);With rails;HOB elevated Sitting - Scoot to Edge of Bed: 4: Min assist Transfers Transfers: Sit to Stand;Stand to Sit Sit to Stand: 4: Min assist;From bed;With upper extremity assist Stand to Sit: 4: Min assist;To chair/3-in-1   Exercise    Balance    End of Session OT - End of Session Activity Tolerance: Patient tolerated treatment well Patient left: in chair;with call bell/phone within reach;with family/visitor present   Martha Olsen 12/07/2011, 6:16 PM

## 2011-12-07 NOTE — Transfer of Care (Signed)
Immediate Anesthesia Transfer of Care Note  Patient: Martha Olsen  Procedure(s) Performed: Procedure(s) (LRB): REVERSE SHOULDER ARTHROPLASTY (Left)  Patient Location: PACU  Anesthesia Type: General  Level of Consciousness: awake, alert  and oriented  Airway & Oxygen Therapy: Patient Spontanous Breathing and Patient connected to nasal cannula oxygen  Post-op Assessment: Report given to PACU RN, Post -op Vital signs reviewed and stable and Patient moving all extremities  Post vital signs: Reviewed and stable  Complications: No apparent anesthesia complications

## 2011-12-07 NOTE — Progress Notes (Signed)
UR COMPLETED  

## 2011-12-07 NOTE — Anesthesia Postprocedure Evaluation (Signed)
  Anesthesia Post-op Note  Patient: Martha Olsen  Procedure(s) Performed: Procedure(s) (LRB): REVERSE SHOULDER ARTHROPLASTY (Left)  Patient Location: PACU  Anesthesia Type: GA combined with regional for post-op pain  Level of Consciousness: awake and alert   Airway and Oxygen Therapy: Patient Spontanous Breathing  Post-op Pain: none  Post-op Assessment: Post-op Vital signs reviewed, Patient's Cardiovascular Status Stable, Respiratory Function Stable, Patent Airway, No signs of Nausea or vomiting and Pain level controlled  Post-op Vital Signs: stable  Complications: No apparent anesthesia complications

## 2011-12-07 NOTE — Op Note (Signed)
12/07/2011  9:18 AM  PATIENT:   Martha Olsen  76 y.o. female  PRE-OPERATIVE DIAGNOSIS:  OA OF LEFT SHOULDER  POST-OPERATIVE DIAGNOSIS:  Same with chronic rotator cuff tear  PROCEDURE:  Left shoulder reverse arthroplasty  SURGEON:  Shaniqwa Horsman, Vania Rea. M.D.  ASSISTANTS: Shuford pac   ANESTHESIA:   GET+ISB  EBL: 200  SPECIMEN:  none  Drains: none   PATIENT DISPOSITION:  PACU - hemodynamically stable.    PLAN OF CARE: Admit to inpatient   Dictation# [id number]

## 2011-12-07 NOTE — Anesthesia Preprocedure Evaluation (Addendum)
Anesthesia Evaluation  Patient identified by MRN, date of birth, ID band Patient awake    Reviewed: Allergy & Precautions, H&P , NPO status , Patient's Chart, lab work & pertinent test results  Airway Mallampati: II TM Distance: >3 FB Neck ROM: Full    Dental  (+) Edentulous Upper and Edentulous Lower   Pulmonary    Pulmonary exam normal       Cardiovascular hypertension,     Neuro/Psych  Headaches, Anxiety Depression CVA    GI/Hepatic   Endo/Other  Diabetes mellitus-  Renal/GU      Musculoskeletal   Abdominal (+) + obese,   Peds  Hematology   Anesthesia Other Findings   Reproductive/Obstetrics                          Anesthesia Physical Anesthesia Plan  ASA: III  Anesthesia Plan: General   Post-op Pain Management:    Induction: Intravenous  Airway Management Planned: Oral ETT  Additional Equipment:   Intra-op Plan:   Post-operative Plan: Extubation in OR  Informed Consent: I have reviewed the patients History and Physical, chart, labs and discussed the procedure including the risks, benefits and alternatives for the proposed anesthesia with the patient or authorized representative who has indicated his/her understanding and acceptance.     Plan Discussed with: CRNA and Surgeon  Anesthesia Plan Comments:         Anesthesia Quick Evaluation

## 2011-12-08 ENCOUNTER — Encounter (HOSPITAL_COMMUNITY): Payer: Self-pay | Admitting: Orthopedic Surgery

## 2011-12-08 DIAGNOSIS — M12812 Other specific arthropathies, not elsewhere classified, left shoulder: Secondary | ICD-10-CM

## 2011-12-08 DIAGNOSIS — M75102 Unspecified rotator cuff tear or rupture of left shoulder, not specified as traumatic: Secondary | ICD-10-CM

## 2011-12-08 LAB — GLUCOSE, CAPILLARY: Glucose-Capillary: 92 mg/dL (ref 70–99)

## 2011-12-08 MED ORDER — OXYCODONE-ACETAMINOPHEN 5-325 MG PO TABS: 1.0000 | ORAL_TABLET | ORAL | Status: AC | PRN

## 2011-12-08 MED ORDER — DIAZEPAM 5 MG PO TABS: 5.0000 mg | ORAL_TABLET | Freq: Four times a day (QID) | ORAL | Status: AC | PRN

## 2011-12-08 NOTE — Discharge Summary (Signed)
  PATIENT ID:      Martha Olsen  MRN:     [id number] DOB/AGE:    74-Jul-1937 / 75 y.o.     DISCHARGE SUMMARY  ADMISSION DATE:    12/07/2011 DISCHARGE DATE:   12/08/2011   ADMISSION DIAGNOSIS: OA OF LEFT SHOULDER  (OA OF LEFT SHOULDER)  DISCHARGE DIAGNOSIS:  OA OF LEFT SHOULDER    ADDITIONAL DIAGNOSIS: Active Problems:  Rotator cuff tear arthropathy, left  Past Medical History  Diagnosis Date  . Stroke 2002  . Hypertension   . Diabetes mellitus 2011  . Headache   . Anxiety   . Depression     PROCEDURE: Procedure(s): REVERSE SHOULDER ARTHROPLASTY on 12/07/2011    HISTORY:  See H&P in chart  HOSPITAL COURSE:  [name] is a 76 y.o. admitted on 12/07/2011 and found to have a diagnosis of OA OF LEFT SHOULDER.  After appropriate laboratory studies were obtained  they were taken to the operating room on 12/07/2011 and underwent Procedure(s): REVERSE SHOULDER ARTHROPLASTY.   They were given perioperative antibiotics:  Anti-infectives     Start     Dose/Rate Route Frequency Ordered Stop   12/07/11 1400   ceFAZolin (ANCEF) IVPB 1 g/50 mL premix        1 g 100 mL/hr over 30 Minutes Intravenous Every 6 hours 12/07/11 1152 12/08/11 0153   12/06/11 1253   ceFAZolin (ANCEF) IVPB 2 g/50 mL premix        2 g 100 mL/hr over 30 Minutes Intravenous 60 min pre-op 12/06/11 1253 12/07/11 0751        .   Patient tolerated the above surgery well and on POD1 she was awake with minimal complaints of pain. She had voided without difficulty and had family assistance at home and was ready for dc to home with HHOT.  The patient was discharged on 1 Day Post-Op in  Good condition.

## 2011-12-08 NOTE — Progress Notes (Signed)
Occupational Therapy Treatment Patient Details Name: Martha Olsen MRN: [id number] DOB: December 28, 1935 Today's Date: 12/08/2011 Time: 4098-1191 OT Time Calculation (min): 17 min  OT Assessment / Plan / Recommendation Comments on Treatment Session Reviewed shoulder precautions & how to don and doff sling with pt/family with good return demo. Reviewed and performed all exercises. All education completed.    Follow Up Recommendations  Other (comment) (progress rehab of the shoulder per MD.)    Barriers to Discharge       Equipment Recommendations  None recommended by OT    Recommendations for Other Services    Frequency     Plan All goals met and education completed, patient discharged from OT services    Precautions / Restrictions Precautions Precautions: Shoulder Precaution Booklet Issued: Yes (comment) Restrictions Weight Bearing Restrictions: Yes LUE Weight Bearing: Non weight bearing   Pertinent Vitals/Pain     ADL       OT Diagnosis:    OT Problem List:   OT Treatment Interventions:     OT Goals ADL Goals ADL Goal: Grooming - Progress: Met ADL Goal: Upper Body Dressing - Progress: Met ADL Goal: Lower Body Dressing - Progress: Met ADL Goal: Toileting - Clothing Manipulation - Progress: Met ADL Goal: Tub/Shower Transfer - Progress: Met ADL Goal: Additional Goal #1 - Progress: Met ADL Goal: Additional Goal #2 - Progress: Met Arm Goals Arm Goal: AROM - Progress: Met Arm Goal: PROM - Progress: Met  Visit Information  Last OT Received On: 12/08/11 Assistance Needed: +1    Subjective Data  Subjective: Ill have a therapist coming to the house.   Prior Functioning       Cognition  Overall Cognitive Status: Appears within functional limits for tasks assessed/performed Arousal/Alertness: Awake/alert Orientation Level: Appears intact for tasks assessed Behavior During Session: [facility] for tasks performed    Mobility Bed Mobility Bed Mobility: Sit to  Supine Supine to Sit: 6: Modified independent (Device/Increase time) Sitting - Scoot to Edge of Bed: 6: Modified independent (Device/Increase time) Sit to Supine: 6: Modified independent (Device/Increase time) Transfers Sit to Stand: 4: Min guard;With upper extremity assist Stand to Sit: 4: Min guard;With upper extremity assist   Exercises Shoulder Exercises Pendulum Exercise: Left;20 reps;Standing Shoulder Flexion: PROM;AAROM;Left;10 reps;Supine;Other (comment) (Educated husband how to perform) Elbow Flexion: AROM;Left;15 reps;Standing  Balance    End of Session OT - End of Session Activity Tolerance: Patient tolerated treatment well Patient left: in bed;with call bell/phone within reach;with family/visitor present   Aariyana Manz A OTR/L [phone] 12/08/2011, 10:32 AM

## 2011-12-08 NOTE — Care Management Note (Signed)
    Page 1 of 1   12/08/2011     10:02:07 AM   CARE MANAGEMENT NOTE 12/08/2011  Patient:  Martha Olsen, Martha Olsen   Account Number:  [id number]  Date Initiated:  12/08/2011  Documentation initiated by:  Anette Guarneri  Subjective/Objective Assessment:   POD#1 s/p Right TSA     Action/Plan:   OT recommends HHOT   Anticipated DC Date:  12/08/2011   Anticipated DC Plan:  HOME W HOME HEALTH SERVICES      DC Planning Services  CM consult      Choice offered to / List presented to:  C-1 Patient        HH arranged  HH-2 PT  HH-3 OT      [facility] agency  [facility]   Status of service:  Completed, signed off Medicare Important Message given?  NO (If response is "NO", the following Medicare IM given date fields will be blank) Date Medicare IM given:   Date Additional Medicare IM given:    Discharge Disposition:  HOME W HOME HEALTH SERVICES  Per UR Regulation:  Reviewed for med. necessity/level of care/duration of stay  If discussed at Long Length of Stay Meetings, dates discussed:    Comments:  12/08/11 10:00  Anette Guarneri RN/CM spoke with patient regarding [facility] services, patient has used [place] in past and would like [facility] services with them. contacted [facility] via [facility], arranged for HHPT/OT

## 2011-12-08 NOTE — Discharge Instructions (Signed)
   Vania Rea. Supple, M.D., F.A.A.O.S. Orthopaedic Surgery Specializing in Arthroscopic and Reconstructive Surgery of the Shoulder and Knee [phone] [address] [place], [place] [address] - Fax [phone]   POST-OP TOTAL SHOULDER REPLACEMENT/SHOULDER HEMIARTHROPLASTY INSTRUCTIONS  1. Call the office at [phone] to schedule your first post-op appointment 10-14 days from the date of your surgery.  2. Leave the steri-strips in place over your incisions when performing dressing changes and showering. You may remove your dressings and begin showering on day 5 from surgery. You can expect drainage that is bloody or yellow in nature that should gradually decrease from day of surgery. Change your dressing daily until drainage is completely resolved, then you may feel free to go without a bandage. You can also expect significant bruising around your shoulder that will drift down your arm and into your chest wall. This is very normal and should resolve over several days.  3. Wear your sling/immobilizer at all times except to perform the exercises below or to occasionally let your arm dangle by your side to stretch your elbow. You also need to sleep in your sling immobilizer until instructed otherwise.  4. Range of motion to your elbow, wrist, and hand are encouraged 3-5 times daily. Exercise to your hand and fingers helps to reduce swelling you may experience.  5. Utilize ice to the shoulder 3-5 times minimum a day and additionally if you are experiencing pain.  6. Prescriptions for a pain medication and a muscle relaxant are provided for you. It is recommended that if you are experiencing pain that you pain medication alone is not controlling, add the muscle relaxant along with the pain medication which can give additional pain relief. The first 1-2 days is generally the most severe of your pain and then should gradually decrease. As your pain lessens it is recommended that you  decrease your use of the pain medications to an "as needed basis'" only and to always comply with the recommended dosages of the pain medications.  7. Pain medications can produce constipation along with their use. If you experience this, the use of an over the counter stool softener or laxative daily is recommended.   8. For most patients, if insurance allows, home health services to include therapy has been arranged.  9. For additional questions or concerns, please do not hesitate to call the office. If after hours there is an answering service to forward your concerns to the physician on call.  POST-OP EXERCISES  Pendulum Exercises  Perform pendulum exercises while standing and bending at the waist. Support your uninvolved arm on a table or chair and allow your operated arm to hang freely. Make sure to do these exercises passively - not using you shoulder muscles.  Repeat 20 times. Do 3 sessions per day.       Home Health to be provided by [facility], [phone]

## 2011-12-12 ENCOUNTER — Encounter (HOSPITAL_COMMUNITY): Payer: Self-pay | Admitting: Orthopedic Surgery

## 2012-02-20 ENCOUNTER — Other Ambulatory Visit: Payer: Self-pay | Admitting: Family Medicine

## 2012-02-20 DIAGNOSIS — Z1231 Encounter for screening mammogram for malignant neoplasm of breast: Secondary | ICD-10-CM

## 2012-03-29 ENCOUNTER — Ambulatory Visit
Admission: RE | Admit: 2012-03-29 | Discharge: 2012-03-29 | Disposition: A | Discharge status: Home / Self Care | Payer: Medicare Other | Source: Ambulatory Visit | Attending: Family Medicine | Admitting: Family Medicine

## 2012-03-29 DIAGNOSIS — Z1231 Encounter for screening mammogram for malignant neoplasm of breast: Secondary | ICD-10-CM

## 2012-10-08 IMAGING — CR DG CHEST 2V
2 series · 2 of 2 positions shown · non-contrast
Comparison: 09/23/2007

CLINICAL DATA: Preop left knee arthroplasty

CHEST - 2 VIEW

[w chest pa]
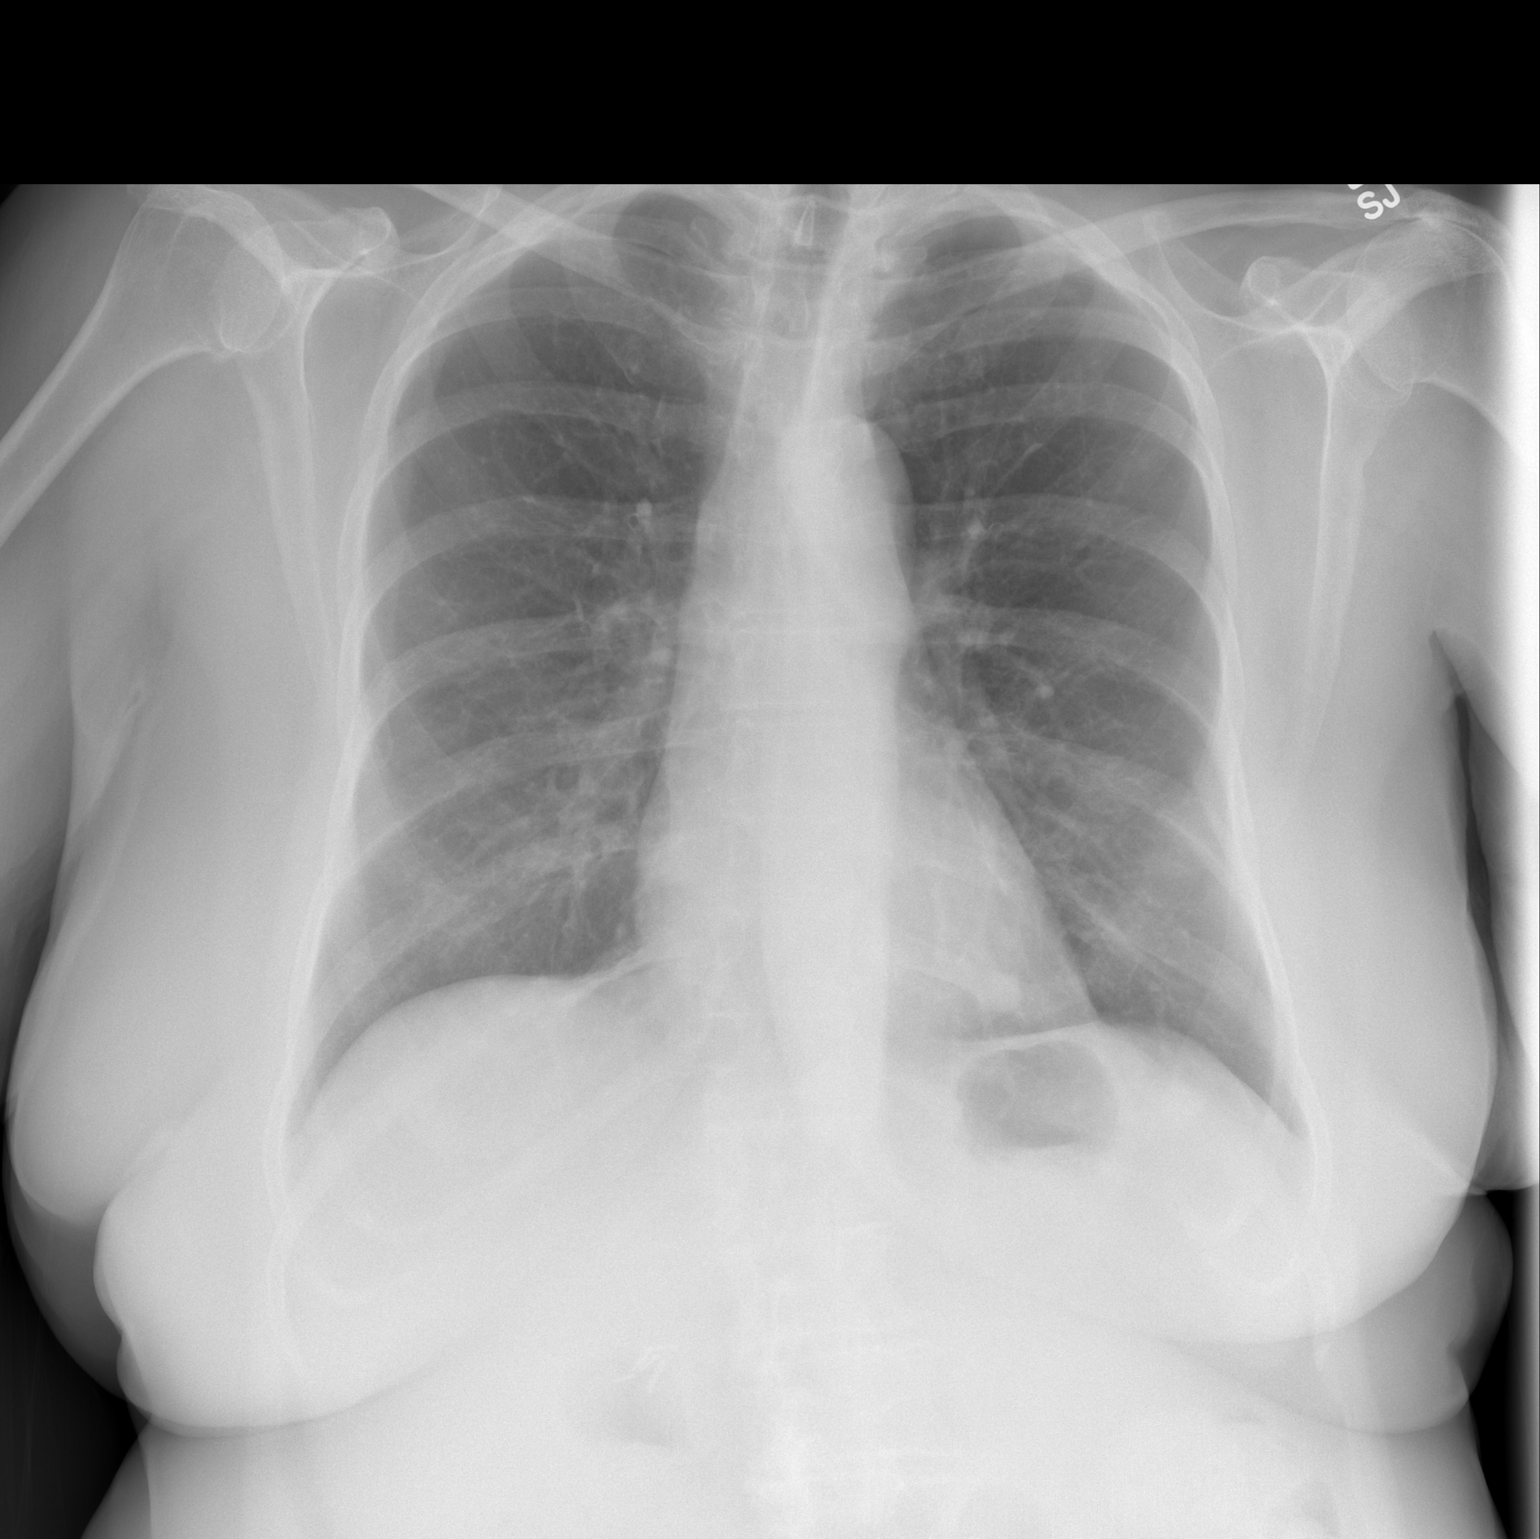

[w chest lat *]
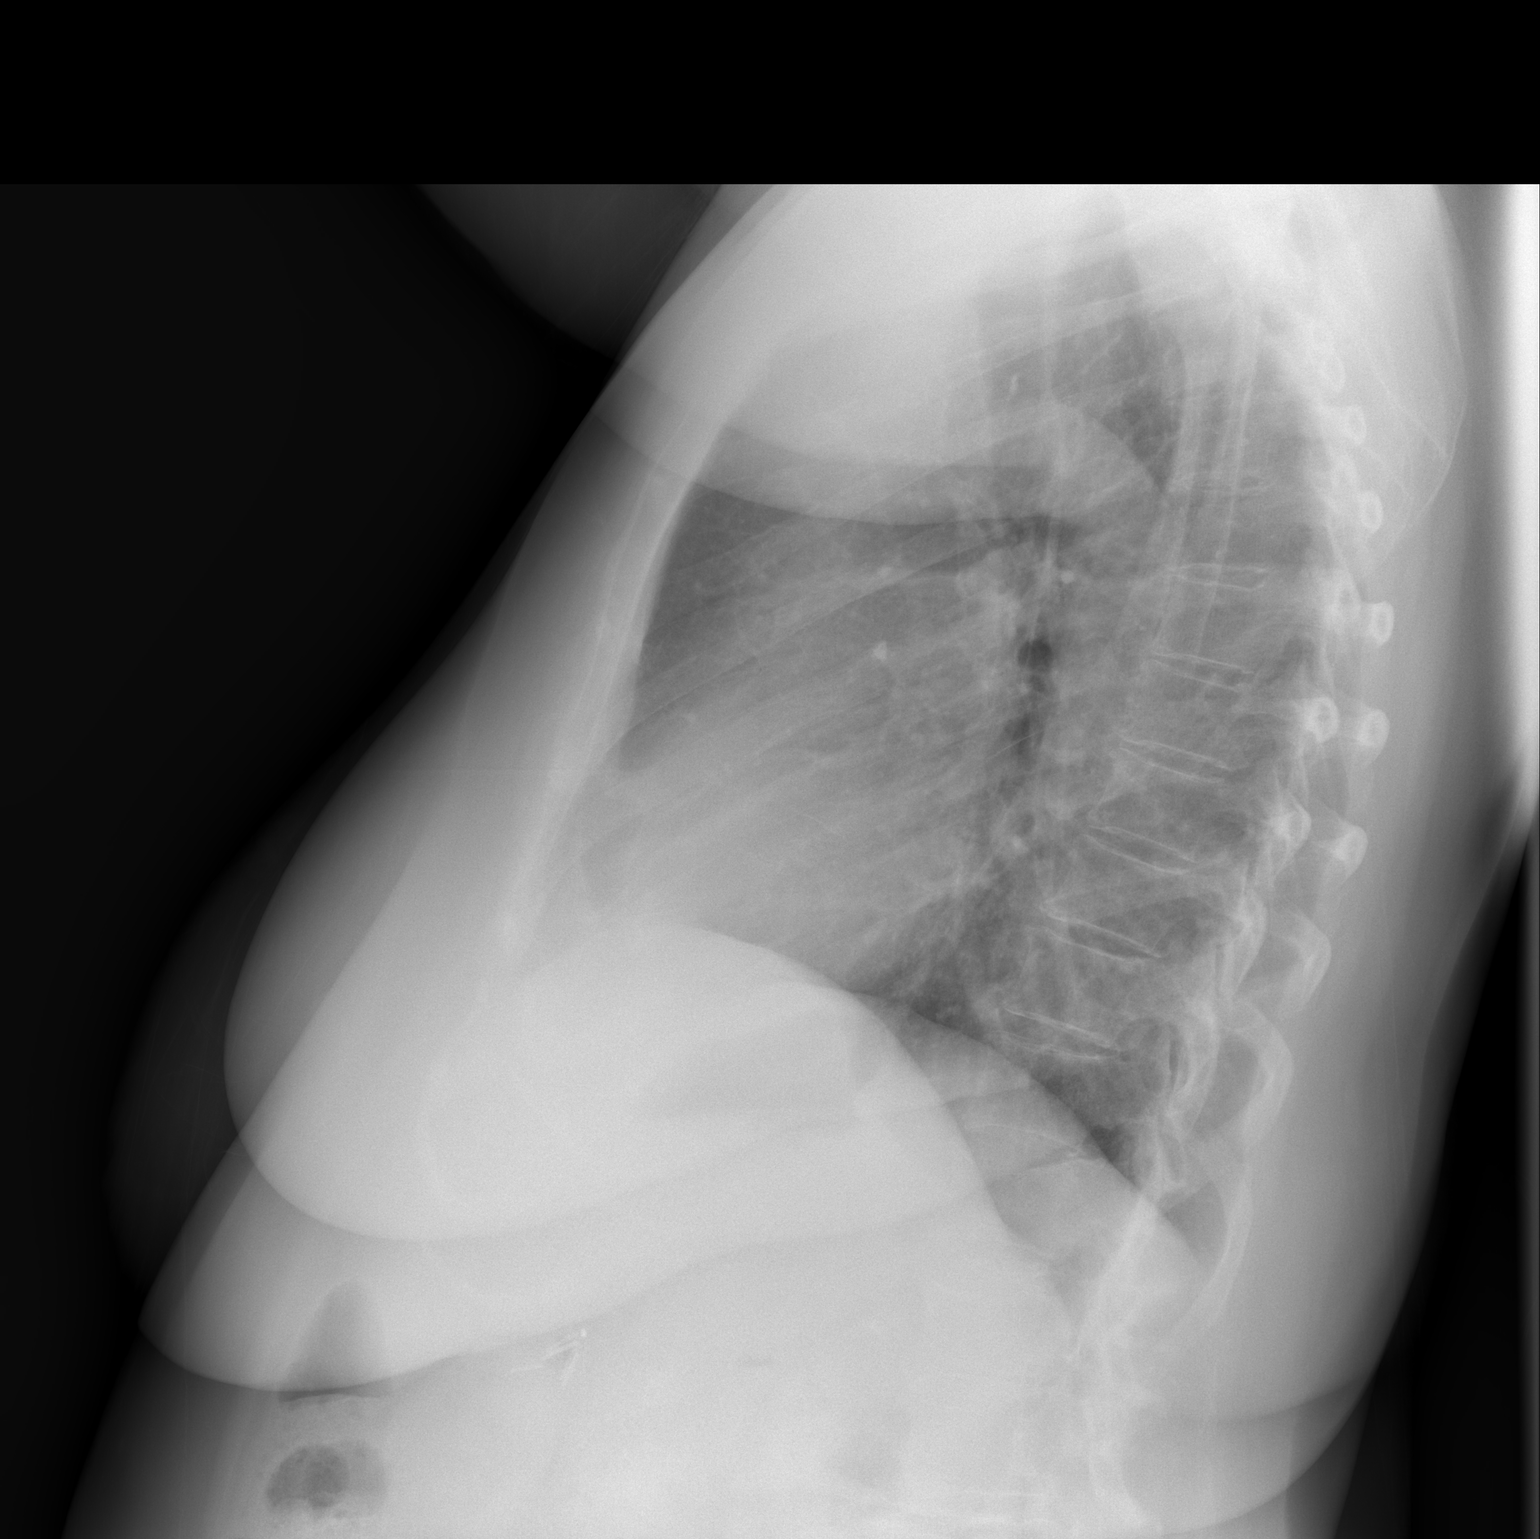

[2 of 2 positions shown; findings below may reference images not displayed]

FINDINGS: Lungs are clear. No pleural effusion or pneumothorax.

Cardiomediastinal silhouette is within normal limits.

Mild degenerative changes of the visualized thoracolumbar spine.

Cholecystectomy clips.
IMPRESSION: No evidence of acute cardiopulmonary disease.

## 2012-10-12 ENCOUNTER — Emergency Department (HOSPITAL_COMMUNITY): Payer: Medicare Other

## 2012-10-12 ENCOUNTER — Inpatient Hospital Stay (HOSPITAL_COMMUNITY): Payer: Medicare Other

## 2012-10-12 ENCOUNTER — Encounter (HOSPITAL_COMMUNITY): Payer: Self-pay | Admitting: Emergency Medicine

## 2012-10-12 ENCOUNTER — Inpatient Hospital Stay (HOSPITAL_COMMUNITY)
Admission: EM | Admit: 2012-10-12 | Discharge: 2012-10-14 | DRG: 100 | Disposition: A | Discharge status: Home / Self Care | Payer: Medicare Other | Attending: Internal Medicine | Admitting: Internal Medicine

## 2012-10-12 DIAGNOSIS — E119 Type 2 diabetes mellitus without complications: Secondary | ICD-10-CM | POA: Diagnosis present

## 2012-10-12 DIAGNOSIS — G40802 Other epilepsy, not intractable, without status epilepticus: Principal | ICD-10-CM | POA: Diagnosis present

## 2012-10-12 DIAGNOSIS — E785 Hyperlipidemia, unspecified: Secondary | ICD-10-CM | POA: Diagnosis present

## 2012-10-12 DIAGNOSIS — E876 Hypokalemia: Secondary | ICD-10-CM | POA: Diagnosis present

## 2012-10-12 DIAGNOSIS — Z96659 Presence of unspecified artificial knee joint: Secondary | ICD-10-CM

## 2012-10-12 DIAGNOSIS — Z8673 Personal history of transient ischemic attack (TIA), and cerebral infarction without residual deficits: Secondary | ICD-10-CM

## 2012-10-12 DIAGNOSIS — J189 Pneumonia, unspecified organism: Secondary | ICD-10-CM | POA: Diagnosis not present

## 2012-10-12 DIAGNOSIS — R778 Other specified abnormalities of plasma proteins: Secondary | ICD-10-CM | POA: Diagnosis present

## 2012-10-12 DIAGNOSIS — R197 Diarrhea, unspecified: Secondary | ICD-10-CM | POA: Diagnosis present

## 2012-10-12 DIAGNOSIS — Z823 Family history of stroke: Secondary | ICD-10-CM

## 2012-10-12 DIAGNOSIS — F329 Major depressive disorder, single episode, unspecified: Secondary | ICD-10-CM | POA: Diagnosis present

## 2012-10-12 DIAGNOSIS — R569 Unspecified convulsions: Secondary | ICD-10-CM

## 2012-10-12 DIAGNOSIS — Z8249 Family history of ischemic heart disease and other diseases of the circulatory system: Secondary | ICD-10-CM

## 2012-10-12 DIAGNOSIS — I10 Essential (primary) hypertension: Secondary | ICD-10-CM | POA: Diagnosis present

## 2012-10-12 DIAGNOSIS — G40909 Epilepsy, unspecified, not intractable, without status epilepticus: Secondary | ICD-10-CM | POA: Diagnosis present

## 2012-10-12 DIAGNOSIS — F3289 Other specified depressive episodes: Secondary | ICD-10-CM | POA: Diagnosis present

## 2012-10-12 DIAGNOSIS — Z7982 Long term (current) use of aspirin: Secondary | ICD-10-CM

## 2012-10-12 DIAGNOSIS — I2510 Atherosclerotic heart disease of native coronary artery without angina pectoris: Secondary | ICD-10-CM | POA: Diagnosis present

## 2012-10-12 DIAGNOSIS — Z96649 Presence of unspecified artificial hip joint: Secondary | ICD-10-CM

## 2012-10-12 DIAGNOSIS — F411 Generalized anxiety disorder: Secondary | ICD-10-CM | POA: Diagnosis present

## 2012-10-12 DIAGNOSIS — R748 Abnormal levels of other serum enzymes: Secondary | ICD-10-CM | POA: Diagnosis present

## 2012-10-12 DIAGNOSIS — Z79899 Other long term (current) drug therapy: Secondary | ICD-10-CM

## 2012-10-12 DIAGNOSIS — Z96619 Presence of unspecified artificial shoulder joint: Secondary | ICD-10-CM

## 2012-10-12 LAB — CBC
MCH: 30 pg (ref 26.0–34.0)
MCH: 29.8 pg (ref 26.0–34.0)
MCHC: 34.2 g/dL (ref 30.0–36.0)
MCHC: 34.1 g/dL (ref 30.0–36.0)
MCV: 87.5 fL (ref 78.0–100.0)
MCV: 87.3 fL (ref 78.0–100.0)
Platelets: 296 10*3/uL (ref 150–400)
Platelets: 290 10*3/uL (ref 150–400)
RBC: 4.17 MIL/uL (ref 3.87–5.11)
RDW: 13.8 % (ref 11.5–15.5)

## 2012-10-12 LAB — LIPID PANEL
HDL: 54 mg/dL (ref >39)
LDL Cholesterol: 48 mg/dL (ref 0–99)
Total CHOL/HDL Ratio: 2.3 RATIO
Triglycerides: 108 mg/dL (ref <150)
VLDL: 22 mg/dL (ref 0–40)

## 2012-10-12 LAB — BASIC METABOLIC PANEL
BUN: 13 mg/dL (ref 6–23)
CO2: 23 mEq/L (ref 19–32)
Calcium: 9.3 mg/dL (ref 8.4–10.5)
Glucose, Bld: 106 mg/dL — ABNORMAL HIGH (ref 70–99)
Sodium: 139 mEq/L (ref 135–145)

## 2012-10-12 LAB — TROPONIN I: Troponin I: 1.42 ng/mL (ref <0.30)

## 2012-10-12 LAB — GLUCOSE, CAPILLARY
Glucose-Capillary: 117 mg/dL — ABNORMAL HIGH (ref 70–99)
Glucose-Capillary: 90 mg/dL (ref 70–99)
Glucose-Capillary: 127 mg/dL — ABNORMAL HIGH (ref 70–99)

## 2012-10-12 LAB — POCT I-STAT TROPONIN I

## 2012-10-12 MED ORDER — POTASSIUM CHLORIDE 10 MEQ/100ML IV SOLN
10.0000 meq | INTRAVENOUS | Status: DC
  Administered 2012-10-12: 10 meq via INTRAVENOUS
  Filled 2012-10-12 (×4): qty 100

## 2012-10-12 MED ORDER — OMEGA-3-ACID ETHYL ESTERS 1 G PO CAPS
1.0000 g | ORAL_CAPSULE | Freq: Two times a day (BID) | ORAL | Status: DC
  Administered 2012-10-12 – 2012-10-14 (×4): 1 g via ORAL
  Filled 2012-10-12 (×5): qty 1

## 2012-10-12 MED ORDER — INSULIN ASPART 100 UNIT/ML ~~LOC~~ SOLN
0.0000 [IU] | Freq: Three times a day (TID) | SUBCUTANEOUS | Status: DC
  Administered 2012-10-13 – 2012-10-14 (×3): 1 [IU] via SUBCUTANEOUS

## 2012-10-12 MED ORDER — HEPARIN SODIUM (PORCINE) 5000 UNIT/ML IJ SOLN
5000.0000 [IU] | Freq: Three times a day (TID) | INTRAMUSCULAR | Status: DC
  Administered 2012-10-12 – 2012-10-14 (×6): 5000 [IU] via SUBCUTANEOUS
  Filled 2012-10-12 (×8): qty 1

## 2012-10-12 MED ORDER — ARIPIPRAZOLE 2 MG PO TABS
2.0000 mg | ORAL_TABLET | Freq: Every day | ORAL | Status: DC
  Administered 2012-10-13 – 2012-10-14 (×2): 2 mg via ORAL
  Filled 2012-10-12 (×2): qty 1

## 2012-10-12 MED ORDER — ROPINIROLE HCL 1 MG PO TABS: 3.0000 mg | ORAL_TABLET | Freq: Every day | ORAL | Status: DC

## 2012-10-12 MED ORDER — ROPINIROLE HCL 1 MG PO TABS
3.0000 mg | ORAL_TABLET | Freq: Every day | ORAL | Status: DC
  Administered 2012-10-12 – 2012-10-13 (×2): 3 mg via ORAL
  Filled 2012-10-12 (×3): qty 3

## 2012-10-12 MED ORDER — LORAZEPAM 0.5 MG PO TABS
0.5000 mg | ORAL_TABLET | Freq: Three times a day (TID) | ORAL | Status: DC | PRN
  Administered 2012-10-12 – 2012-10-13 (×3): 0.5 mg via ORAL
  Filled 2012-10-12 (×3): qty 1

## 2012-10-12 MED ORDER — SODIUM CHLORIDE 0.9 % IV SOLN
INTRAVENOUS | Status: DC
  Administered 2012-10-12 – 2012-10-13 (×3): via INTRAVENOUS

## 2012-10-12 MED ORDER — TRAMADOL HCL 50 MG PO TABS: 25.0000 mg | ORAL_TABLET | Freq: Four times a day (QID) | ORAL | Status: DC | PRN

## 2012-10-12 MED ORDER — ONDANSETRON HCL 4 MG/2ML IJ SOLN: 4.0000 mg | Freq: Four times a day (QID) | INTRAMUSCULAR | Status: DC | PRN

## 2012-10-12 MED ORDER — SODIUM CHLORIDE 0.9 % IJ SOLN
3.0000 mL | Freq: Two times a day (BID) | INTRAMUSCULAR | Status: DC
  Administered 2012-10-13: 3 mL via INTRAVENOUS

## 2012-10-12 MED ORDER — SODIUM CHLORIDE 0.9 % IV SOLN
1000.0000 mg | Freq: Once | INTRAVENOUS | Status: AC
  Administered 2012-10-12: 1000 mg via INTRAVENOUS
  Filled 2012-10-12: qty 10

## 2012-10-12 MED ORDER — BUSPIRONE HCL 15 MG PO TABS
15.0000 mg | ORAL_TABLET | Freq: Three times a day (TID) | ORAL | Status: DC
  Administered 2012-10-12 – 2012-10-14 (×6): 15 mg via ORAL
  Filled 2012-10-12 (×7): qty 1

## 2012-10-12 MED ORDER — ACETAMINOPHEN 650 MG RE SUPP: 650.0000 mg | Freq: Four times a day (QID) | RECTAL | Status: DC | PRN

## 2012-10-12 MED ORDER — ONDANSETRON HCL 4 MG/2ML IJ SOLN: 4.0000 mg | Freq: Three times a day (TID) | INTRAMUSCULAR | Status: DC | PRN

## 2012-10-12 MED ORDER — ACETAMINOPHEN 325 MG PO TABS: 650.0000 mg | ORAL_TABLET | Freq: Four times a day (QID) | ORAL | Status: DC | PRN

## 2012-10-12 MED ORDER — POTASSIUM CHLORIDE CRYS ER 20 MEQ PO TBCR
40.0000 meq | EXTENDED_RELEASE_TABLET | Freq: Once | ORAL | Status: AC
  Administered 2012-10-12: 40 meq via ORAL
  Filled 2012-10-12: qty 1

## 2012-10-12 MED ORDER — SERTRALINE HCL 50 MG PO TABS
75.0000 mg | ORAL_TABLET | Freq: Every day | ORAL | Status: DC
  Administered 2012-10-13 – 2012-10-14 (×2): 75 mg via ORAL
  Filled 2012-10-12 (×2): qty 1

## 2012-10-12 MED ORDER — ONDANSETRON HCL 4 MG PO TABS: 4.0000 mg | ORAL_TABLET | Freq: Four times a day (QID) | ORAL | Status: DC | PRN

## 2012-10-12 NOTE — ED Provider Notes (Signed)
77 year old female who presents after having a new onset seizure which was witnessed by family members. According to the patient's child she was changing a flower arrangement around her recently passed sons URN when she developed acute onset of shaking, loss of balance falling forward and striking her face on a closet door. She was lowered into her chair by family members were she continue to have tonic-clonic-type shaking activity followed by a period of apnea that was witnessed by the husband. A short time later she regained her ability to breathe, went unresponsive but was able to maintain her breathing and gradually came back to baseline. The paramedics arrived and found the patient to be postictal, no signs of tongue biting, no signs of urinary incontinence though the patient does wear an adult diaper. At this time the patient denies any prodromal symptoms that she does not remember the events of the morning, she is alert and oriented on my exam, she is able to follow commands without difficulty or deficits including no weakness, no numbness, no slurred speech, normal vision, normal coordination. She has clear heart and lung sounds, soft abdomen, no peripheral edema. Her pupillary exam is normal, she will need a CT scan laboratory workup to rule out other sources of new-onset seizures. Of note the patient is on several different psychiatric medications and with the recent addition of antibiotic could have lowered his seizure threshold.name of this antibiotic is unknown at this time  The patient is not any more seizures, she maintains with a mild sinus tachycardia at approximately 102, EKG does not show any other significant findings. Her troponin is elevated at 0.22 though this is somewhat nonspecific. She is not having chest pain, does not have any shortness of breath, CT scan of the head does not show acute infarction but does show evidence of prior infarction. The patient will need admission to the  hospital for further observation.  ED ECG REPORT  I personally interpreted this EKG   Date: 10/12/2012   Rate: 108  Rhythm: sinus tachycardia  QRS Axis: normal  Intervals: normal  ST/T Wave abnormalities: nonspecific ST/T changes  Conduction Disutrbances:none  Narrative Interpretation:   Old EKG Reviewed: Compared with 01/31/2011, nonspecific ST and T wave changes seen, rate slightly faster.  Care discussed with hospitalist, they will admit the patient to the hospital. CT scan negative. Keppra started  Medical screening examination/treatment/procedure(s) were conducted as a shared visit with non-physician practitioner(s) and myself.  I personally evaluated the patient during the encounter    Vida Roller, MD 10/12/12 1535

## 2012-10-12 NOTE — H&P (Signed)
PCP:   Benita Stabile, MD   Chief Complaint:  Seizure episode.   HPI: This is a 77 year old female, with known history of HTN, s/p CVA right basal ganglia 11/2001, dyslipidemia, DM, anxiety/depression, s/p appendectomy, s/p hysterectomy, DJD, s/p left hip replacement 03/2002, left knee replacement 01/2011, foot surgery and reverse left shoulder arthroplasty 11/2011. Patient has retrograde amnesia for event, so history is obtained form family members and ED MD. She apparently lost her son about 2 weeks ago, and today in her home, was arranging flowers around the urn, when she said "Oh my", her eyes rolled back, she slumped in the chair, the hit her face on a closet door, as she slumped out of the chair and started convulsing like a rag doll, with all four limbs moving. Episode is said to have lasted around 5 minutes with intermittent periods of apnea. Patient was not incontinent and had no tongue-biting, although her tongue was said to have turned "purple" during the event. Family called EMS, and when they arrived, patient was confused and combative. She was brought to the ED, and received Versed en route. Patient has no previous history of similar episodes. Patient developed pneumonia two weeks ago, was treated with a 10-day course of antibiotics, which she completed 2 days ago. Over the past  3 days, she has had nausea and watery diarrhea, but no vomiting or abdominal pain. No recent sick contact with a diarrheal illness.    Allergies:   Allergies  Allergen Reactions  . Codeine Hives      Past Medical History  Diagnosis Date  . Stroke 2002  . Hypertension   . Diabetes mellitus 2011  . Headache   . Anxiety   . Depression     Past Surgical History  Procedure Laterality Date  . Foot surgery    . Cholecystectomy  1993  . Tonsillectomy  1951  . Eye surgery  2009  . Abdominal hysterectomy  1980's  . Appendectomy  1980's  . Joint replacement  2012    Hip, Knee  . Reverse shoulder  arthroplasty  12/07/2011    Procedure: REVERSE SHOULDER ARTHROPLASTY;  Surgeon: Senaida Lange, MD;  Location: [facility] OR;  Service: Orthopedics;  Laterality: Left;  left total reverse shoulder    Prior to Admission medications   Medication Sig Start Date End Date Taking? Authorizing Provider  ARIPiprazole (ABILIFY) 10 MG tablet Take 2 mg by mouth daily.   Yes Historical Provider, MD  atorvastatin (LIPITOR) 40 MG tablet Take 40 mg by mouth daily.   Yes Historical Provider, MD  busPIRone (BUSPAR) 15 MG tablet Take 15 mg by mouth 3 (three) times daily.   Yes Historical Provider, MD  glimepiride (AMARYL) 2 MG tablet Take 2 mg by mouth daily before breakfast.   Yes Historical Provider, MD  lisinopril (PRINIVIL,ZESTRIL) 20 MG tablet Take 20 mg by mouth daily.   Yes Historical Provider, MD  LORazepam (ATIVAN) 1 MG tablet Take 0.5 mg by mouth every 8 (eight) hours.   Yes Historical Provider, MD  Omega-3 Fatty Acids (FISH OIL) 1200 MG CAPS Take 2,400 mg by mouth 2 (two) times daily.   Yes Historical Provider, MD  rOPINIRole (REQUIP) 3 MG tablet Take 3 mg by mouth at bedtime.   Yes Historical Provider, MD  sertraline (ZOLOFT) 50 MG tablet Take 75 mg by mouth daily.    Yes Historical Provider, MD    Social History: Patient reports that she has never smoked. She does not have  any smokeless tobacco history on file. She reports that she does not drink alcohol. Her drug history is not on file. She is married and had 4 offspring. Her son passed away about 2 weeks ago.   Family History  Problem Relation Age of Onset  . Anesthesia problems Neg Hx     Review of Systems:  As per HPI and chief complaint. Patent denies fatigue, diminished appetite, weight loss, fever, chills, headache, blurred vision, difficulty in speaking, dysphagia, chest pain, cough, shortness of breath, orthopnea, paroxysmal nocturnal dyspnea, diaphoresis, abdominal pain, vomiting, belching, heartburn, hematemesis, melena, dysuria, nocturia,  urinary frequency, hematochezia, lower extremity swelling, pain, or redness. The rest of the systems review is negative.  Physical Exam:  General:  Patient does not appear to be in obvious acute distress. Alert, communicative, fully oriented, albeit with retrograde amnesia, talking in complete sentences, not short of breath at rest.  HEENT:  No clinical pallor, no jaundice, no conjunctival injection or discharge. Buccal mucosa appears "dry". There is no tongue trauma.  NECK:  Supple, JVP not seen, no carotid bruits, no palpable lymphadenopathy, no palpable goiter. CHEST:  Clinically clear to auscultation, no wheezes, no crackles. HEART:  Sounds 1 and 2 heard, normal, regular, no murmurs. ABDOMEN:  Moderately obese, soft, non-tender, no palpable organomegaly, no palpable masses, normal bowel sounds. GENITALIA:  Not examined. LOWER EXTREMITIES:  No pitting edema, palpable peripheral pulses. MUSCULOSKELETAL SYSTEM:  Generalized osteoarthritic changes, otherwise, normal. CENTRAL NERVOUS SYSTEM:  No focal neurologic deficit on gross examination.  Labs on Admission:  Results for orders placed during the hospital encounter of 10/12/12 (from the past 48 hour(s))  BASIC METABOLIC PANEL     Status: Abnormal   Collection Time    10/12/12  2:02 PM      Result Value Range   Sodium 139  135 - 145 mEq/L   Potassium 3.4 (*) 3.5 - 5.1 mEq/L   Chloride 104  96 - 112 mEq/L   CO2 23  19 - 32 mEq/L   Glucose, Bld 106 (*) 70 - 99 mg/dL   BUN 13  6 - 23 mg/dL   Creatinine, Ser 1.61  0.50 - 1.10 mg/dL   Calcium 9.3  8.4 - 09.6 mg/dL   GFR calc non Af Amer 89 (*) >90 mL/min   GFR calc Af Amer >90  >90 mL/min   Comment:            The eGFR has been calculated     using the CKD EPI equation.     This calculation has not been     validated in all clinical     situations.     eGFR's persistently     <90 mL/min signify     possible Chronic Kidney Disease.  CBC     Status: Abnormal   Collection Time     10/12/12  2:02 PM      Result Value Range   WBC 11.9 (*) 4.0 - 10.5 K/uL   RBC 4.17  3.87 - 5.11 MIL/uL   Hemoglobin 12.5  12.0 - 15.0 g/dL   HCT 04.5  40.9 - 81.1 %   MCV 87.5  78.0 - 100.0 fL   MCH 30.0  26.0 - 34.0 pg   MCHC 34.2  30.0 - 36.0 g/dL   RDW 91.4  78.2 - 95.6 %   Platelets 296  150 - 400 K/uL  POCT I-STAT TROPONIN I     Status: Abnormal   Collection Time  10/12/12  2:22 PM      Result Value Range   Troponin i, poc 0.22 (*) 0.00 - 0.08 ng/mL   Comment NOTIFIED PHYSICIAN     Comment 3            Comment: Due to the release kinetics of cTnI,     a negative result within the first hours     of the onset of symptoms does not rule out     myocardial infarction with certainty.     If myocardial infarction is still suspected,     repeat the test at appropriate intervals.  GLUCOSE, CAPILLARY     Status: Abnormal   Collection Time    10/12/12  2:59 PM      Result Value Range   Glucose-Capillary 117 (*) 70 - 99 mg/dL    Radiological Exams on Admission: Ct Head Wo Contrast   (if New Onset Seizure And/or Head Trauma)  10/12/2012  *RADIOLOGY REPORT*  Clinical Data: Seizures.  CT HEAD WITHOUT CONTRAST  Technique:  Contiguous axial images were obtained from the base of the skull through the vertex without contrast.  Comparison: 04/02/2008  Findings: There is an old infarct involving the right basal ganglia and right white matter tracts.  No evidence for acute hemorrhage, mass lesion, midline shift, hydrocephalus or new large infarct.  No acute bony abnormality.  IMPRESSION: No acute intracranial abnormality.  Old right basal ganglia infarct.   Original Report Authenticated By: Richarda Overlie, M.D.     Assessment/Plan Active Problems:    1. Seizure disorder: Patient presented with an episode of abrupt loss of consciousness, followed by generalized involuntary movement, and a subsequent post-ictal state, clinically consistent with a generalized seizure episode. CBG was normal at  117, and head CT scan was negative for acute findings, although it did confirm an old right basal ganglia infarct. At the time of my evaluation, she was back to normal, and devoid of focal neurology. We shall admit to the neuro-telemetry unit, do neuro-checks, arrange brain MRI, EEG. Neurology consultation will be obtained, but it is likely that patient will need anticonvulsant medication, given remote history of CVA. Loading dose of Keppra has been administered in the ED.  2. Elevated troponin I level: Troponin I was 0.22. Patient had no antecedent chest pain, palpitations or SOB. She also has no history of CAD, and 12-Lead EKG shows no acute ischemic changes. I suspect her elevated troponin may reflect excessive muscle activity, occasioned by her seizure. Will complete cycling cardiac enzymes, but include Total CK,.  3. Diarrhea: Patient has had  3 days of watery diarrhea, without abdominal pain or vomiting. This, following completion of a course of antibiotics for CAP, raises the suspicion of C. Difficle infection. Will check C. Difficile PCR, and manage with supportive treatment, including iv fluids. 4. Hypokalemia: This is mild, and attributable to GI fluid loss, from diarrhea. Will replete as indicated, and check magnesium levels. Following lytes.  5. Pneumonia: Patient has just concluded a 10-day course of antibiotics for CA. Clinically, this appears to have resolved. Will do CXR, which will also be helpful to evaluate for possible aspiration.  6. DM (diabetes mellitus): Type-2, and appears controlled, based on random blood glucose of 117. We shall manage with SSI, for now, and check HBA1C.  7. HTN (hypertension): BP is controlled at this time. Will place pre-admission antihypertensives on hold for now, and observe.  8. Dyslipidemia: On statin. We shall check lipid profile and TSH.  Further management will depend on clinical course.   Comment: Patient is FULL CODE.    Time Spent on  Admission: 1 hour.   Kohl Polinsky,CHRISTOPHER 10/12/2012, 4:29 PM

## 2012-10-12 NOTE — ED Provider Notes (Signed)
History     CSN: [id number]  Arrival date & time 10/12/12  1313   First MD Initiated Contact with Patient 10/12/12 1313      Chief Complaint  Patient presents with  . Seizures    (Consider location/radiation/quality/duration/timing/severity/associated sxs/prior treatment) HPI  Patient is a 77 yo F PMHx significant for stroke, HTN, and DM presenting with possible seizure like activity earlier today witnessed by daughter. Patient does not remember events leading up to seizure like activity. Daughter states patient had an aura like event where patient stated "Oh my" and then her eyes rolled back, slumped in the chair, hit her head as she slumped out of the chair and started convulsing like a rag doll with all four limbs moving. Daughter states episode lasted around 5 minutes with intermittent periods of apnea and is not sure if the patient was incontinent as she wears diapers. Daughter states patient was confused and combative once convulsions stopped. EMS brought patient to hospital and gave Versed. No FHx or personal history of seizures. Patient has recent history of pneumonia two weeks ago and was treated. Patient finished antibiotic course two days ago. Patient also had recent several episdoes of vomiting and diarrhea three days ago with chills that has resolved. Denies fevers, chills, nausea, vomiting, headache, abdominal pain, back or neck pain, weakness, numbness, tingling, CP, or SOB. Denies non-compliance with medications or recent medication dose changes.  Past Medical History  Diagnosis Date  . Stroke 2002  . Hypertension   . Diabetes mellitus 2011  . Headache   . Anxiety   . Depression     Past Surgical History  Procedure Laterality Date  . Foot surgery    . Cholecystectomy  1993  . Tonsillectomy  1951  . Eye surgery  2009  . Abdominal hysterectomy  1980's  . Appendectomy  1980's  . Joint replacement  2012    Hip, Knee  . Reverse shoulder arthroplasty  12/07/2011   Procedure: REVERSE SHOULDER ARTHROPLASTY;  Surgeon: Senaida Lange, MD;  Location: [facility] OR;  Service: Orthopedics;  Laterality: Left;  left total reverse shoulder    Family History  Problem Relation Age of Onset  . Anesthesia problems Neg Hx     History  Substance Use Topics  . Smoking status: Never Smoker   . Smokeless tobacco: Not on file  . Alcohol Use: No    OB History   Grav Para Term Preterm Abortions TAB SAB Ect Mult Living                  Review of Systems  Constitutional: Negative for fever and chills.  HENT: Negative for neck pain and neck stiffness.   Respiratory: Negative for shortness of breath.   Cardiovascular: Negative for chest pain.  Gastrointestinal: Negative for abdominal pain.  Genitourinary: Negative for dysuria.  Musculoskeletal: Negative for back pain.  Skin: Negative for color change.  Neurological: Negative for dizziness, weakness, light-headedness, numbness and headaches.  Psychiatric/Behavioral: Positive for confusion.    Allergies  Codeine  Home Medications   No current outpatient prescriptions on file.  BP 127/77  Pulse 83  Temp(Src) 98.1 F (36.7 C) (Oral)  Resp 20  Ht 5\' 4"  (1.626 m)  Wt 190 lb 1.6 oz (86.229 kg)  BMI 32.61 kg/m2  SpO2 97%  Physical Exam  Constitutional: She appears well-developed and well-nourished. No distress.  HENT:  Head: Normocephalic. Head is with abrasion and with contusion. Head is without raccoon's eyes, without Battle's sign and  without laceration.    Right Ear: Tympanic membrane and external ear normal.  Left Ear: Tympanic membrane and external ear normal.  Nose: Nose normal.  Mouth/Throat: Uvula is midline, oropharynx is clear and moist and mucous membranes are normal. No oral lesions. No lacerations.  No tongue laceration.   Eyes: Conjunctivae and EOM are normal. Pupils are equal, round, and reactive to light.  Neck: Normal range of motion. Neck supple. No spinous process tenderness and no  muscular tenderness present.  Cardiovascular: Regular rhythm and normal heart sounds.  Tachycardia present.  Exam reveals no gallop and no friction rub.   No murmur heard. Pulses:      Dorsalis pedis pulses are 2+ on the right side, and 2+ on the left side.       Posterior tibial pulses are 2+ on the right side, and 2+ on the left side.  Pulmonary/Chest: Effort normal and breath sounds normal. No respiratory distress. She has no wheezes. She has no rales.  Abdominal: Soft. Bowel sounds are normal. She exhibits no distension. There is no tenderness. There is no rigidity, no rebound and no guarding.  Neurological: She is alert. She has normal strength. No cranial nerve deficit or sensory deficit.  Patient is mildly disoriented. Unsure of time and place but oriented to self.   Skin: Skin is warm and dry. No rash noted.  Psychiatric: Her speech is normal.    ED Course  Procedures (including critical care time)   Medications  ARIPiprazole (ABILIFY) tablet 2 mg (not administered)  rOPINIRole (REQUIP) tablet 3 mg (not administered)  LORazepam (ATIVAN) tablet 0.5 mg (not administered)  omega-3 acid ethyl esters (LOVAZA) capsule 1 g (not administered)  sertraline (ZOLOFT) tablet 75 mg (not administered)  busPIRone (BUSPAR) tablet 15 mg (not administered)  potassium chloride 10 mEq in 100 mL IVPB (not administered)  heparin injection 5,000 Units (not administered)  sodium chloride 0.9 % injection 3 mL (not administered)  0.9 %  sodium chloride infusion (not administered)  acetaminophen (TYLENOL) tablet 650 mg (not administered)    Or  acetaminophen (TYLENOL) suppository 650 mg (not administered)  ondansetron (ZOFRAN) tablet 4 mg (not administered)    Or  ondansetron (ZOFRAN) injection 4 mg (not administered)  traMADol (ULTRAM) tablet 25 mg (not administered)  insulin aspart (novoLOG) injection 0-9 Units (not administered)  levETIRAcetam (KEPPRA) 1,000 mg in sodium chloride 0.9 % 100 mL  IVPB (1,000 mg Intravenous New Bag/Given 10/12/12 1625)    Labs Reviewed  BASIC METABOLIC PANEL - Abnormal; Notable for the following:    Potassium 3.4 (*)    Glucose, Bld 106 (*)    GFR calc non Af Amer 89 (*)    All other components within normal limits  CBC - Abnormal; Notable for the following:    WBC 11.9 (*)    All other components within normal limits  GLUCOSE, CAPILLARY - Abnormal; Notable for the following:    Glucose-Capillary 117 (*)    All other components within normal limits  POCT I-STAT TROPONIN I - Abnormal; Notable for the following:    Troponin i, poc 0.22 (*)    All other components within normal limits  CLOSTRIDIUM DIFFICILE BY PCR  URINE RAPID DRUG SCREEN (HOSP PERFORMED)  CK TOTAL AND CKMB  CK TOTAL AND CKMB  TROPONIN I  TROPONIN I  TROPONIN I  MAGNESIUM  MAGNESIUM  TSH  LIPID PANEL  HEMOGLOBIN A1C  CBC  CREATININE, SERUM  COMPREHENSIVE METABOLIC PANEL  CBC  GLUCOSE, CAPILLARY  Date: 10/12/2012  Rate: 108  Rhythm: sinus tachycardia  QRS Axis: normal  Intervals: normal  ST/T Wave abnormalities: nonspecific T wave changes  Conduction Disutrbances:none  Narrative Interpretation:   Old EKG Reviewed: changes noted  Hospitalist consulted for admission for new onset seizures.  Patient will be admitted for new onset seizure.   Ct Head Wo Contrast   (if New Onset Seizure And/or Head Trauma)  10/12/2012  *RADIOLOGY REPORT*  Clinical Data: Seizures.  CT HEAD WITHOUT CONTRAST  Technique:  Contiguous axial images were obtained from the base of the skull through the vertex without contrast.  Comparison: 04/02/2008  Findings: There is an old infarct involving the right basal ganglia and right white matter tracts.  No evidence for acute hemorrhage, mass lesion, midline shift, hydrocephalus or new large infarct.  No acute bony abnormality.  IMPRESSION: No acute intracranial abnormality.  Old right basal ganglia infarct.   Original Report Authenticated By:  Richarda Overlie, M.D.      1. Seizure   2. Diarrhea   3. DM (diabetes mellitus)   4. Elevated troponin I level       MDM  Patient is a 77 yo F PMHx signifant for stroke,  HTN, and DM presenting with possible seizure like activity earlier today witnessed by daughter. Convulsions lasted approximately five minutes with periods of apnea. Patient was confused and agitated after convulsions ceased. Patient was oriented to self, but not time or location upon arrival to ED. Patient has no prior history of seizures and no family history of seizures. No focal neurologic findings on examination. Rest of physical examination unremarkable. New onset seizure work up was initiated. No EKG abnormalities noted or acute CT head scan findings. Troponin was abnormal. Patient will be admitted for further work up and evaluation for new onset seizures. Patient and family agreeable to admission. Patient d/w with Dr. Hyacinth Meeker, agrees with plan. Patient is stable and ready for transport.           Jeannetta Ellis, PA-C 10/12/12 1731

## 2012-10-12 NOTE — Consult Note (Signed)
NEURO HOSPITALIST CONSULT NOTE    Reason for Consult: new onset seizure.  HPI:                                                                                                                                          Martha Olsen is an 77 y.o. female with a past medical history significant for hypertension, DM, right BG infarct in 2003 but no residual deficits, depression/anxiety, brought to [facility] by ambulance after sustaining a witnessed generalized convulsion at home. Martha Olsen has no recollection of the event. According to her daughter, Martha Olsen recently lost her son and today was at home sitting in a chair arranging flowers around her son ashes  when suddenly they said " oh my god", eyes rolled back, slumped in the chair, unresponsive, stop breathing, and started having generalized, violent jerking movements for approximately 5 minutes. Afterwards, she was confused and amnestic for the episode. EMS called and brought to the ED. Received versed during transport to [facility]. No further clinical seizures and unaltered mental status upon arrival to the ED. Loaded with 1 gram IV keppra. Recent pneumonia but otherwise nothing out of the ordinary. No family history of epilepsy, CNS infections, or severe head injury. CT brain showed no acute intracranial abnormality but chronic right subcortical infarct.  Past Medical History  Diagnosis Date  . Stroke 2002  . Hypertension   . Diabetes mellitus 2011  . Headache   . Anxiety   . Depression     Past Surgical History  Procedure Laterality Date  . Foot surgery    . Cholecystectomy  1993  . Tonsillectomy  1951  . Eye surgery  2009  . Abdominal hysterectomy  1980's  . Appendectomy  1980's  . Joint replacement  2012    Hip, Knee  . Reverse shoulder arthroplasty  12/07/2011    Procedure: REVERSE SHOULDER ARTHROPLASTY;  Surgeon: Senaida Lange, MD;  Location: [facility] OR;  Service: Orthopedics;  Laterality: Left;  left total  reverse shoulder    Family History  Problem Relation Age of Onset  . Anesthesia problems Neg Hx     Social History:  reports that she has never smoked. She does not have any smokeless tobacco history on file. She reports that she does not drink alcohol. Her drug history is not on file.  Allergies  Allergen Reactions  . Codeine Hives    MEDICATIONS:  I have reviewed the patient's current medications.   ROS:                                                                                                                                       History obtained from chart review and family.  General ROS: negative for - chills, fatigue, fever, night sweats, weight gain or weight loss Psychological ROS: negative for - behavioral disorder, hallucinations, memory difficulties, mood swings or suicidal ideation Ophthalmic ROS: negative for - blurry vision, double vision, eye pain or loss of vision ENT ROS: negative for - epistaxis, nasal discharge, oral lesions, sore throat, tinnitus or vertigo Allergy and Immunology ROS: negative for - hives or itchy/watery eyes Hematological and Lymphatic ROS: negative for - bleeding problems, bruising or swollen lymph nodes Endocrine ROS: negative for - galactorrhea, hair pattern changes, polydipsia/polyuria or temperature intolerance Respiratory ROS: negative for - hemoptysis. Cardiovascular ROS: negative for - chest pain, dyspnea on exertion, edema or irregular heartbeat Gastrointestinal ROS: negative for - abdominal pain, diarrhea, hematemesis, nausea/vomiting or stool incontinence Genito-Urinary ROS: negative for - dysuria, hematuria, incontinence or urinary frequency/urgency Musculoskeletal ROS: negative for - joint swelling or muscular weakness Neurological ROS: as noted in HPI Dermatological ROS: negative for rash and skin lesion  changes    Physical exam: pleasant female in no apparent distress. Blood pressure 127/77, pulse 83, temperature 98.1 F (36.7 C), temperature source Oral, resp. rate 20, height 5\' 4"  (1.626 m), weight 86.229 kg (190 lb 1.6 oz), SpO2 97.00%. Head: normocephalic. Neck: supple, no bruits, no JVD. Cardiac: no murmurs. Lungs: clear. Abdomen: soft, no tender, no mass. Extremities: no edema.   Neurologic Examination:                                                                                                      Mental Status: Alert, awake, oriented x 4, thought content appropriate.  Comprehension, naming, and repetition intact.Speech fluent without evidence of aphasia.  Able to follow 3 step commands without difficulty. Cranial Nerves: II: Discs flat bilaterally; Visual fields grossly normal, pupils equal, round, reactive to light and accommodation III,IV, VI: ptosis not present, extra-ocular motions intact bilaterally V,VII: smile symmetric, facial light touch sensation normal bilaterally VIII: hearing normal bilaterally IX,X: gag reflex present XI: bilateral shoulder shrug XII: midline tongue extension Motor: Right : Upper extremity   5/5    Left:     Upper extremity   5/5  Lower extremity   5/5  Lower extremity   5/5 Tone and bulk:normal tone throughout; no atrophy noted Sensory: Pinprick and light touch intact throughout, bilaterally Deep Tendon Reflexes: 2+ and symmetric throughout Plantars: Right: downgoing   Left: downgoing Cerebellar: normal finger-to-nose,  normal heel-to-shin test Gait: no tested. CV: pulses palpable throughout    No results found for this basename: cbc, bmp, coags, chol, tri, ldl, hga1c    Results for orders placed during the hospital encounter of 10/12/12 (from the past 48 hour(s))  BASIC METABOLIC PANEL     Status: Abnormal   Collection Time    10/12/12  2:02 PM      Result Value Range   Sodium 139  135 - 145 mEq/L   Potassium 3.4 (*) 3.5  - 5.1 mEq/L   Chloride 104  96 - 112 mEq/L   CO2 23  19 - 32 mEq/L   Glucose, Bld 106 (*) 70 - 99 mg/dL   BUN 13  6 - 23 mg/dL   Creatinine, Ser 8.65  0.50 - 1.10 mg/dL   Calcium 9.3  8.4 - 78.4 mg/dL   GFR calc non Af Amer 89 (*) >90 mL/min   GFR calc Af Amer >90  >90 mL/min   Comment:            The eGFR has been calculated     using the CKD EPI equation.     This calculation has not been     validated in all clinical     situations.     eGFR's persistently     <90 mL/min signify     possible Chronic Kidney Disease.  CBC     Status: Abnormal   Collection Time    10/12/12  2:02 PM      Result Value Range   WBC 11.9 (*) 4.0 - 10.5 K/uL   RBC 4.17  3.87 - 5.11 MIL/uL   Hemoglobin 12.5  12.0 - 15.0 g/dL   HCT 69.6  29.5 - 28.4 %   MCV 87.5  78.0 - 100.0 fL   MCH 30.0  26.0 - 34.0 pg   MCHC 34.2  30.0 - 36.0 g/dL   RDW 13.2  44.0 - 10.2 %   Platelets 296  150 - 400 K/uL  POCT I-STAT TROPONIN I     Status: Abnormal   Collection Time    10/12/12  2:22 PM      Result Value Range   Troponin i, poc 0.22 (*) 0.00 - 0.08 ng/mL   Comment NOTIFIED PHYSICIAN     Comment 3            Comment: Due to the release kinetics of cTnI,     a negative result within the first hours     of the onset of symptoms does not rule out     myocardial infarction with certainty.     If myocardial infarction is still suspected,     repeat the test at appropriate intervals.  GLUCOSE, CAPILLARY     Status: Abnormal   Collection Time    10/12/12  2:59 PM      Result Value Range   Glucose-Capillary 117 (*) 70 - 99 mg/dL    Ct Head Wo Contrast   (if New Onset Seizure And/or Head Trauma)  10/12/2012  *RADIOLOGY REPORT*  Clinical Data: Seizures.  CT HEAD WITHOUT CONTRAST  Technique:  Contiguous axial images were obtained from the base of the skull through the vertex without contrast.  Comparison: 04/02/2008  Findings: There is an old infarct involving the right basal ganglia and right white matter tracts.   No evidence for acute hemorrhage, mass lesion, midline shift, hydrocephalus or new large infarct.  No acute bony abnormality.  IMPRESSION: No acute intracranial abnormality.  Old right basal ganglia infarct.   Original Report Authenticated By: Richarda Overlie, M.D.      Assessment/Plan: 77 years old female with new onset seizure, most likely symptomatic post stroke GTC seizure. Agree with initiating keppra 500 mg BID starting tomorrow (already loaded with 1 gram IV keppra). EEG, MRI brain. Will follow up.  Wyatt Portela, MD  [facility] [phone]  10/12/2012, 5:53 PM

## 2012-10-12 NOTE — ED Notes (Signed)
PA at bedside.

## 2012-10-12 NOTE — ED Notes (Signed)
Admitting MD at bedside.

## 2012-10-12 NOTE — ED Notes (Signed)
Phlebotomy at bedside.

## 2012-10-12 NOTE — Progress Notes (Signed)
CRITICAL VALUE ALERT  Critical value received:  troponins 1.42  Date of notification:  10/12/2012  Time of notification:  1907  Critical value read back:yes  Nurse who received alert:  Christena Deem, RN  MD notified (1st page):  R. Reidler  Time of first page:  15  MD notified (2nd page):  Time of second page:  Responding MD:  Tana Coast  Time MD responded:  [phone]

## 2012-10-12 NOTE — ED Notes (Signed)
BEfore possible seizure pt had aura, per family she said "oh no".  She then started shaking and fell to the floor. No injury to tongue. Pt oriented to self. Family states she was vomiting last night and hasn't felt well.

## 2012-10-12 NOTE — ED Notes (Signed)
Per EMS pt came from home where she had a possible seizure. Family states she was sitting on a chair and her eyes rolled back and she started shaking all over for approximately 5 minutes. Pt was having periods of apnea where family had to direct her to breathe. Pt does not have hx of seizures. PT was dx with pneumonia last week. Upon EMS arrival pt was very disoriented and combative. EMS gave 2.5 versed IV at 1235. PT has a bump where she hit her forehead, no other injuries noted.

## 2012-10-13 ENCOUNTER — Inpatient Hospital Stay (HOSPITAL_COMMUNITY): Payer: Medicare Other

## 2012-10-13 ENCOUNTER — Encounter (HOSPITAL_COMMUNITY): Payer: Self-pay | Admitting: Cardiology

## 2012-10-13 DIAGNOSIS — R7989 Other specified abnormal findings of blood chemistry: Secondary | ICD-10-CM

## 2012-10-13 DIAGNOSIS — G40909 Epilepsy, unspecified, not intractable, without status epilepticus: Secondary | ICD-10-CM

## 2012-10-13 DIAGNOSIS — E876 Hypokalemia: Secondary | ICD-10-CM

## 2012-10-13 DIAGNOSIS — E785 Hyperlipidemia, unspecified: Secondary | ICD-10-CM

## 2012-10-13 DIAGNOSIS — I10 Essential (primary) hypertension: Secondary | ICD-10-CM

## 2012-10-13 LAB — GLUCOSE, CAPILLARY
Glucose-Capillary: 103 mg/dL — ABNORMAL HIGH (ref 70–99)
Glucose-Capillary: 141 mg/dL — ABNORMAL HIGH (ref 70–99)
Glucose-Capillary: 148 mg/dL — ABNORMAL HIGH (ref 70–99)

## 2012-10-13 LAB — HEMOGLOBIN A1C: Hgb A1c MFr Bld: 7 % — ABNORMAL HIGH (ref <5.7)

## 2012-10-13 LAB — CK TOTAL AND CKMB (NOT AT ARMC)
CK, MB: 8.1 ng/mL (ref 0.3–4.0)
Total CK: 186 U/L — ABNORMAL HIGH (ref 7–177)
Total CK: 187 U/L — ABNORMAL HIGH (ref 7–177)

## 2012-10-13 LAB — TROPONIN I
Troponin I: 1 ng/mL (ref <0.30)
Troponin I: 0.65 ng/mL (ref <0.30)
Troponin I: 0.62 ng/mL (ref <0.30)

## 2012-10-13 LAB — COMPREHENSIVE METABOLIC PANEL
ALT: 20 U/L (ref 0–35)
BUN: 12 mg/dL (ref 6–23)
CO2: 20 mEq/L (ref 19–32)
Calcium: 8.9 mg/dL (ref 8.4–10.5)
Creatinine, Ser: 0.65 mg/dL (ref 0.50–1.10)
GFR calc Af Amer: >90 mL/min (ref >90)
GFR calc non Af Amer: 85 mL/min — ABNORMAL LOW (ref >90)
Glucose, Bld: 99 mg/dL (ref 70–99)
Sodium: 141 mEq/L (ref 135–145)

## 2012-10-13 LAB — CBC
Hemoglobin: 11.4 g/dL — ABNORMAL LOW (ref 12.0–15.0)
MCHC: 32.7 g/dL (ref 30.0–36.0)
WBC: 10.2 10*3/uL (ref 4.0–10.5)

## 2012-10-13 MED ORDER — CHLORHEXIDINE GLUCONATE 0.12 % MT SOLN
15.0000 mL | Freq: Two times a day (BID) | OROMUCOSAL | Status: DC
  Administered 2012-10-13 – 2012-10-14 (×3): 15 mL via OROMUCOSAL
  Filled 2012-10-13 (×5): qty 15

## 2012-10-13 MED ORDER — ASPIRIN 81 MG PO CHEW
81.0000 mg | CHEWABLE_TABLET | Freq: Every day | ORAL | Status: DC
  Administered 2012-10-13 – 2012-10-14 (×2): 81 mg via ORAL
  Filled 2012-10-13 (×2): qty 1

## 2012-10-13 MED ORDER — LORAZEPAM 2 MG/ML IJ SOLN
0.5000 mg | Freq: Once | INTRAMUSCULAR | Status: AC
  Administered 2012-10-13: 0.5 mg via INTRAVENOUS
  Filled 2012-10-13: qty 1

## 2012-10-13 MED ORDER — CARVEDILOL 6.25 MG PO TABS
6.2500 mg | ORAL_TABLET | Freq: Two times a day (BID) | ORAL | Status: DC
  Administered 2012-10-13 – 2012-10-14 (×4): 6.25 mg via ORAL
  Filled 2012-10-13 (×5): qty 1

## 2012-10-13 MED ORDER — LORAZEPAM 1 MG PO TABS: 1.0000 mg | ORAL_TABLET | Freq: Three times a day (TID) | ORAL | Status: DC | PRN

## 2012-10-13 MED ORDER — BIOTENE DRY MOUTH MT LIQD
15.0000 mL | Freq: Two times a day (BID) | OROMUCOSAL | Status: DC
  Administered 2012-10-13 – 2012-10-14 (×3): 15 mL via OROMUCOSAL

## 2012-10-13 MED ORDER — LEVETIRACETAM 500 MG PO TABS
500.0000 mg | ORAL_TABLET | Freq: Two times a day (BID) | ORAL | Status: DC
  Administered 2012-10-13 – 2012-10-14 (×3): 500 mg via ORAL
  Filled 2012-10-13 (×4): qty 1

## 2012-10-13 MED ORDER — CARVEDILOL 6.25 MG PO TABS
6.2500 mg | ORAL_TABLET | Freq: Two times a day (BID) | ORAL | Status: DC
  Filled 2012-10-13 (×2): qty 1

## 2012-10-13 NOTE — Progress Notes (Signed)
  Echocardiogram 2D Echocardiogram has been performed.  Georgian Co 10/13/2012, 2:35 PM

## 2012-10-13 NOTE — Consult Note (Signed)
CARDIOLOGY CONSULT NOTE  Patient ID: Martha Olsen MRN: [id number] DOB/AGE: 12/09/1935 77 y.o.  Admit date: 10/12/2012 Primary Physician:  Benita Stabile, MD Primary Cardiologist None Chief Complaint  Elevated troponin.  HPI:  The patient was admitted with a LOC thought to be related to a seizure.  She has been seen by neurology.  We are called because of an increased troponin.  The patient has no past cardiac history though she does have risk factors. She is relatively sedentary. She has been under stress with her son dying a few weeks ago from cancer. With her usual activities she does not describing chest discomfort, neck or arm discomfort. She doesn't describe any presyncope or syncope. She does occasionally have palpitations. She has some dyspnea with significant exertion but she does not describe PND or orthopnea. She doesn't recall the events surrounding her seizure. She was with her daughter who said she said "oh no." She then had some seizure activity, her eyes rolled back and per her daughter she stopped breathing. She had regained consciousness but was confused prior to being transported via EMS. She has not had any acute EKG changes. However cardiac enzymes have been cycle. Troponin peak was 1.4.  Total CK was mildly elevated at 186 with an MB of 8.1 and index of 4.4.  Echo has been ordered and she is being treated with ASA and beta blocker.    Past Medical History  Diagnosis Date  . Stroke 2002  . Hypertension   . Diabetes mellitus 2011  . Headache   . Anxiety   . Depression     Past Surgical History  Procedure Laterality Date  . Foot surgery    . Cholecystectomy  1993  . Tonsillectomy  1951  . Eye surgery  2009  . Abdominal hysterectomy  1980's  . Appendectomy  1980's  . Joint replacement  2012    Hip, Knee  . Reverse shoulder arthroplasty  12/07/2011    Procedure: REVERSE SHOULDER ARTHROPLASTY;  Surgeon: Senaida Lange, MD;  Location: [facility] OR;  Service:  Orthopedics;  Laterality: Left;  left total reverse shoulder    Allergies  Allergen Reactions  . Codeine Hives   Prescriptions prior to admission  Medication Sig Dispense Refill  . ARIPiprazole (ABILIFY) 10 MG tablet Take 2 mg by mouth daily.      Marland Kitchen atorvastatin (LIPITOR) 40 MG tablet Take 40 mg by mouth daily.      . busPIRone (BUSPAR) 15 MG tablet Take 15 mg by mouth 3 (three) times daily.      Marland Kitchen glimepiride (AMARYL) 2 MG tablet Take 2 mg by mouth daily before breakfast.      . lisinopril (PRINIVIL,ZESTRIL) 20 MG tablet Take 20 mg by mouth daily.      Marland Kitchen LORazepam (ATIVAN) 1 MG tablet Take 0.5 mg by mouth every 8 (eight) hours.      . Omega-3 Fatty Acids (FISH OIL) 1200 MG CAPS Take 2,400 mg by mouth 2 (two) times daily.      Marland Kitchen rOPINIRole (REQUIP) 3 MG tablet Take 3 mg by mouth at bedtime.      . sertraline (ZOLOFT) 50 MG tablet Take 75 mg by mouth daily.        Family History  Problem Relation Age of Onset  . Anesthesia problems Neg Hx   . CAD Father 66  . Stroke Mother 61    History   Social History  . Marital Status: Married    Spouse Name: N/A  Number of Children: 4  . Years of Education: N/A   Occupational History  . Not on file.   Social History Main Topics  . Smoking status: Never Smoker   . Smokeless tobacco: Not on file  . Alcohol Use: No  . Drug Use:   . Sexually Active:    Other Topics Concern  . Not on file   Social History Narrative   Son recently died of cancer.  Lives at home with husband.       ROS:  As stated in the HPI and negative for all other systems.  Physical Exam: Blood pressure 162/71, pulse 76, temperature 98.8 F (37.1 C), temperature source Oral, resp. rate 20, height 5\' 4"  (1.626 m), weight 192 lb 0.3 oz (87.1 kg), SpO2 96.00%.  GENERAL:  Well appearing HEENT:  Pupils equal round and reactive, fundi not visualized, oral mucosa unremarkable NECK:  No jugular venous distention, waveform within normal limits, carotid upstroke brisk  and symmetric, no bruits, no thyromegaly LYMPHATICS:  No cervical, inguinal adenopathy LUNGS:  Clear to auscultation bilaterally BACK:  No CVA tenderness CHEST:  Unremarkable HEART:  PMI not displaced or sustained,S1 and S2 within normal limits, no S3, no S4, no clicks, no rubs, no murmurs ABD:  Flat, positive bowel sounds normal in frequency in pitch, no bruits, no rebound, no guarding, no midline pulsatile mass, no hepatomegaly, no splenomegaly EXT:  2 plus pulses throughout, no edema, no cyanosis no clubbing SKIN:  No rashes no nodules NEURO:  Cranial nerves II through XII grossly intact, motor grossly intact throughout PSYCH:  Cognitively intact, oriented to person place and time  Labs: Lab Results  Component Value Date   BUN 12 10/13/2012   Lab Results  Component Value Date   CREATININE 0.65 10/13/2012   Lab Results  Component Value Date   NA 141 10/13/2012   K 3.8 10/13/2012   CL 107 10/13/2012   CO2 20 10/13/2012   Lab Results  Component Value Date   CKTOTAL 187* 10/13/2012   CKMB 6.4* 10/13/2012   TROPONINI 0.62* 10/13/2012   Lab Results  Component Value Date   WBC 10.2 10/13/2012   HGB 11.4* 10/13/2012   HCT 34.9* 10/13/2012   MCV 90.6 10/13/2012   PLT 310 10/13/2012   Lab Results  Component Value Date   CHOL 124 10/12/2012   HDL 54 10/12/2012   LDLCALC 48 10/12/2012   TRIG 108 10/12/2012   CHOLHDL 2.3 10/12/2012   Lab Results  Component Value Date   ALT 20 10/13/2012   AST 38* 10/13/2012   ALKPHOS 85 10/13/2012   BILITOT 0.6 10/13/2012     Radiology:  CXR:  Borderline heart size. Vascular congestion. Bronchitic changes.   EKG:  NSR, rate 80, diffuse nonspecific ST T wave changes with QTC prolongation.  Axis WNL. 10/12/2012  ASSESSMENT AND PLAN:   Elevated troponin:  I agree with current treatment plans.  She can be evaluated with an outpatient Lexiscan Myoview which we will arrange at discharge  HTN:  I agree with the addition of low dose beta blocker to the ACE  inhibitor.    SignedRollene Rotunda 10/13/2012, 2:02 PM

## 2012-10-13 NOTE — Progress Notes (Signed)
CRITICAL VALUE ALERT  Critical value received:  ckmb  Date of notification:  10/13/12  Time of notification:  0010  Critical value read back: yes  Nurse who received alert:  Revonda Standard  MD notified (1st page):  [email or web address]  Time of first page:  0030  MD notified (2nd page):  Time of second page:  Responding MD:  [email or web address]  Time MD responded: 0030

## 2012-10-13 NOTE — Progress Notes (Addendum)
[facility] PROGRESS NOTE  Martha Olsen NWG:[id number] DOB: May 07, 1936 DOA: 10/12/2012 PCP: Benita Stabile, MD  Assessment/Plan: 1. Seizure disorder: will follow neurology rec's. Follow EEG and continue kepra.  2. Elevated troponin I level: Troponin I peak at 1.4; risk factors includes, HTN, HLD, Diabetes and age; no EKG changes. Will check 2-D echo and ask cardiology to help with further evaluation. She will require at least stress test (which could be done as an outpatient). Cycle enzymes to ensure they are trending down. No CP. Continue ASA and low dose B-blocker.  3. Diarrhea: Patient has had chronic episodes of intermittent watery diarrhea, without specific abdominal pain or vomiting. Since she recently finish abx's treatment as an outpatient will follow C. Diff by PCR. If negative with treat symptomatically.  4. Hypokalemia: Repleted  5. Pneumonia: Patient's CXR do not demonstrated any findings of new activity. Is afebrile and w/o SOB. Will hold on any further abx's treatment at this moment.  6. DM (diabetes mellitus): Type-2, and appears controlled, A1C 7.0. Will continue SSI and low carb diet  7. HTN (hypertension): BP is controlled at this time. Since there is concerns with elevation on her troponin, will start low dose b-blocker.  8. Dyslipidemia: continue fish oil. Will check lipid panel  Code Status: Full Family Communication: daughters at bedside Disposition Plan: home when medically stable   Consultants:  Cardiology  Procedures:  2-D echo pending  Antibiotics:  none  HPI/Subjective: Reports feeling better and just mild episodes were she answer question wrong (patient realized of situation and correct herself right away). Afebrile. Denies CP or SOB. Overnight troponin continue to be elevated, peak 1.42  Objective: Filed Vitals:   10/12/12 2355 10/13/12 0400 10/13/12 0602 10/13/12 0822  BP: 128/67 138/73  131/72  Pulse: 81 80  86  Temp: 98 F  (36.7 C) 98.1 F (36.7 C)  98.4 F (36.9 C)  TempSrc: Oral Oral  Oral  Resp: 19 19  20   Height:      Weight:   87.1 kg (192 lb 0.3 oz)   SpO2: 98% 98%  91%    Intake/Output Summary (Last 24 hours) at 10/13/12 1010 Last data filed at 10/13/12 0823  Gross per 24 hour  Intake   1115 ml  Output      0 ml  Net   1115 ml   Filed Weights   10/12/12 1645 10/13/12 0602  Weight: 86.229 kg (190 lb 1.6 oz) 87.1 kg (192 lb 0.3 oz)    Exam:   General:  NAD, AAOX3, no CP or SOB  Cardiovascular: S1 and S2, no rubs or gallops  Respiratory: CTA  Abdomen: soft, NT, ND, positive BS  Musculoskeletal: no joint swelling or erythma  Neuro: CN intact, no focal deficit.  Data Reviewed: Basic Metabolic Panel:  Recent Labs Lab 10/12/12 1402 10/12/12 1750 10/13/12 0500  NA 139  --  141  K 3.4*  --  3.8  CL 104  --  107  CO2 23  --  20  GLUCOSE 106*  --  99  BUN 13  --  12  CREATININE 0.57 0.56 0.65  CALCIUM 9.3  --  8.9  MG  --  2.0 2.0   Liver Function Tests:  Recent Labs Lab 10/13/12 0500  AST 38*  ALT 20  ALKPHOS 85  BILITOT 0.6  PROT 6.4  ALBUMIN 3.1*   CBC:  Recent Labs Lab 10/12/12 1402 10/12/12 1750 10/13/12 0500  WBC 11.9* 12.8* 10.2  HGB  12.5 11.7* 11.4*  HCT 36.5 34.3* 34.9*  MCV 87.5 87.3 90.6  PLT 296 290 310   Cardiac Enzymes:  Recent Labs Lab 10/12/12 1750 10/12/12 2321 10/12/12 2322 10/13/12 0453 10/13/12 0833  CKTOTAL 105  --  186*  --  187*  CKMB 5.9*  --  8.1*  --  6.4*  TROPONINI 1.42* 1.00*  --  0.65*  --    CBG:  Recent Labs Lab 10/12/12 1459 10/12/12 1655 10/12/12 2153 10/13/12 0735  GLUCAP 117* 90 127* 103*    Studies: Ct Head Wo Contrast   (if New Onset Seizure And/or Head Trauma)  10/12/2012  *RADIOLOGY REPORT*  Clinical Data: Seizures.  CT HEAD WITHOUT CONTRAST  Technique:  Contiguous axial images were obtained from the base of the skull through the vertex without contrast.  Comparison: 04/02/2008  Findings: There  is an old infarct involving the right basal ganglia and right white matter tracts.  No evidence for acute hemorrhage, mass lesion, midline shift, hydrocephalus or new large infarct.  No acute bony abnormality.  IMPRESSION: No acute intracranial abnormality.  Old right basal ganglia infarct.   Original Report Authenticated By: Richarda Overlie, M.D.    Dg Chest Port 1 View  10/12/2012  *RADIOLOGY REPORT*  Clinical Data: Shortness of breath, congestion.  Recent pneumonia.  PORTABLE CHEST - 1 VIEW  Comparison: 10/04/2012  Findings: Heart is borderline in size.  Mild vascular congestion and peribronchial thickening.  No confluent opacities or effusions. No acute bony abnormality.  Prior left shoulder replacement.  IMPRESSION: Borderline heart size.  Vascular congestion.  Bronchitic changes.   Original Report Authenticated By: Charlett Nose, M.D.     Scheduled Meds: . antiseptic oral rinse  15 mL Mouth Rinse q12n4p  . ARIPiprazole  2 mg Oral Daily  . busPIRone  15 mg Oral TID  . chlorhexidine  15 mL Mouth Rinse BID  . heparin  5,000 Units Subcutaneous Q8H  . insulin aspart  0-9 Units Subcutaneous TID WC  . levETIRAcetam  500 mg Oral BID  . omega-3 acid ethyl esters  1 g Oral BID  . rOPINIRole  3 mg Oral QHS  . sertraline  75 mg Oral Daily  . sodium chloride  3 mL Intravenous Q12H   Continuous Infusions: . sodium chloride 75 mL/hr at 10/13/12 1610    Active Problems:   Seizure disorder   Elevated troponin I level   Diarrhea   Pneumonia   Hypokalemia   DM (diabetes mellitus)   HTN (hypertension)   Dyslipidemia    Time spent: >30 minutes    Elka Satterfield  [facility] Pager [phone]. If 7PM-7AM, please contact night-coverage at [email or web address], password [facility] 10/13/2012, 10:10 AM  LOS: 1 day

## 2012-10-13 NOTE — ED Provider Notes (Signed)
Medical screening examination/treatment/procedure(s) were conducted as a shared visit with non-physician practitioner(s) and myself.  I personally evaluated the patient during the encounter  Please see my separate respective documentation pertaining to this patient encounter   Vida Roller, MD 10/13/12 [phone]

## 2012-10-14 ENCOUNTER — Inpatient Hospital Stay (HOSPITAL_COMMUNITY)
Admit: 2012-10-14 | Discharge: 2012-10-14 | Disposition: A | Discharge status: Home / Self Care | Payer: Medicare Other | Attending: Internal Medicine | Admitting: Internal Medicine

## 2012-10-14 LAB — RAPID URINE DRUG SCREEN, HOSP PERFORMED
Barbiturates: NOT DETECTED
Tetrahydrocannabinol: NOT DETECTED

## 2012-10-14 LAB — CK TOTAL AND CKMB (NOT AT ARMC)
CK, MB: 5.1 ng/mL — ABNORMAL HIGH (ref 0.3–4.0)
Relative Index: 2.8 — ABNORMAL HIGH (ref 0.0–2.5)
Relative Index: 3.3 — ABNORMAL HIGH (ref 0.0–2.5)

## 2012-10-14 LAB — CBC
HCT: 31.9 % — ABNORMAL LOW (ref 36.0–46.0)
Platelets: 235 10*3/uL (ref 150–400)
RDW: 14.3 % (ref 11.5–15.5)
WBC: 7.4 10*3/uL (ref 4.0–10.5)

## 2012-10-14 LAB — BASIC METABOLIC PANEL
Chloride: 106 mEq/L (ref 96–112)
GFR calc Af Amer: >90 mL/min (ref >90)
Potassium: 3.4 mEq/L — ABNORMAL LOW (ref 3.5–5.1)

## 2012-10-14 LAB — LIPID PANEL: Cholesterol: 117 mg/dL (ref 0–200)

## 2012-10-14 LAB — TROPONIN I: Troponin I: 0.31 ng/mL (ref <0.30)

## 2012-10-14 LAB — GLUCOSE, CAPILLARY

## 2012-10-14 MED ORDER — SACCHAROMYCES BOULARDII 250 MG PO CAPS: 250.0000 mg | ORAL_CAPSULE | Freq: Two times a day (BID) | ORAL | Status: AC

## 2012-10-14 MED ORDER — CARVEDILOL 6.25 MG PO TABS: 6.2500 mg | ORAL_TABLET | Freq: Two times a day (BID) | ORAL | Status: DC

## 2012-10-14 MED ORDER — ASPIRIN 81 MG PO CHEW: 81.0000 mg | CHEWABLE_TABLET | Freq: Every day | ORAL | Status: AC

## 2012-10-14 MED ORDER — LEVETIRACETAM 500 MG PO TABS: 500.0000 mg | ORAL_TABLET | Freq: Two times a day (BID) | ORAL | Status: DC

## 2012-10-14 NOTE — Progress Notes (Addendum)
Subjective:  Denies chest pain or dyspnea. No dizziness. No further seizures  Objective:  Vital Signs in the last 24 hours: Temp:  [98.3 F (36.8 C)-99.5 F (37.5 C)] 98.3 F (36.8 C) (03/31 0542) Pulse Rate:  [70-86] 84 (03/31 0542) Resp:  [18-20] 20 (03/31 0542) BP: (111-162)/(52-75) 148/74 mmHg (03/31 0542) SpO2:  [91 %-99 %] 99 % (03/31 0542)  Intake/Output from previous day: 03/30 0701 - 03/31 0700 In: 2040 [P.O.:240; I.V.:1800] Out: -  Intake/Output from this shift:    . antiseptic oral rinse  15 mL Mouth Rinse q12n4p  . ARIPiprazole  2 mg Oral Daily  . aspirin  81 mg Oral Daily  . busPIRone  15 mg Oral TID  . carvedilol  6.25 mg Oral BID WC  . chlorhexidine  15 mL Mouth Rinse BID  . heparin  5,000 Units Subcutaneous Q8H  . insulin aspart  0-9 Units Subcutaneous TID WC  . levETIRAcetam  500 mg Oral BID  . omega-3 acid ethyl esters  1 g Oral BID  . rOPINIRole  3 mg Oral QHS  . sertraline  75 mg Oral Daily  . sodium chloride  3 mL Intravenous Q12H   . sodium chloride 75 mL/hr at 10/14/12 0500    Physical Exam: The patient appears to be in no distress.  Head and neck exam reveals that the pupils are equal and reactive.  The extraocular movements are full.  There is no scleral icterus.  Mouth and pharynx are benign.  No lymphadenopathy.  No carotid bruits.  The jugular venous pressure is normal.  Thyroid is not enlarged or tender.  Chest is clear to percussion and auscultation.  No rales or rhonchi.  Expansion of the chest is symmetrical.  Heart reveals no abnormal lift or heave.  First and second heart sounds are normal.  There is no murmur gallop rub or click.  The abdomen is soft and nontender.  Bowel sounds are normoactive.  There is no hepatosplenomegaly or mass.  There are no abdominal bruits.  Extremities reveal no phlebitis or edema.  Pedal pulses are good.  There is no cyanosis or clubbing.  Neurologic exam is normal strength and no lateralizing  weakness.  No sensory deficits.  Integument reveals no rash  Lab Results:  Recent Labs  10/13/12 0500 10/14/12 0110  WBC 10.2 7.4  HGB 11.4* 10.9*  PLT 310 235    Recent Labs  10/13/12 0500 10/14/12 0110  NA 141 138  K 3.8 3.4*  CL 107 106  CO2 20 23  GLUCOSE 99 116*  BUN 12 15  CREATININE 0.65 0.65    Recent Labs  10/13/12 1800 10/14/12 0110  TROPONINI 0.41* 0.31*   Hepatic Function Panel  Recent Labs  10/13/12 0500  PROT 6.4  ALBUMIN 3.1*  AST 38*  ALT 20  ALKPHOS 85  BILITOT 0.6    Recent Labs  10/14/12 0110  CHOL 117   No results found for this basename: PROTIME,  in the last 72 hours  Imaging: Ct Head Wo Contrast   (if New Onset Seizure And/or Head Trauma)  10/12/2012  *RADIOLOGY REPORT*  Clinical Data: Seizures.  CT HEAD WITHOUT CONTRAST  Technique:  Contiguous axial images were obtained from the base of the skull through the vertex without contrast.  Comparison: 04/02/2008  Findings: There is an old infarct involving the right basal ganglia and right white matter tracts.  No evidence for acute hemorrhage, mass lesion, midline shift, hydrocephalus or new large  infarct.  No acute bony abnormality.  IMPRESSION: No acute intracranial abnormality.  Old right basal ganglia infarct.   Original Report Authenticated By: Richarda Overlie, M.D.    Mr Brain Wo Contrast  10/13/2012  *RADIOLOGY REPORT*  Clinical Data: Seizure activity.  MRI HEAD WITHOUT CONTRAST  Technique:  Multiplanar, multiecho pulse sequences of the brain and surrounding structures were obtained according to standard protocol without intravenous contrast.  Comparison: CT head without contrast 10/12/2012.  CT head without contrast 01/01/2006.  Findings: A remote lacunar infarct of the right putamen corona radiata is stable. The infarct contains remote blood products. Mild atrophy white matter disease is present bilaterally.  No acute infarct, hemorrhage, or mass lesion is present.  There is no  significant heterotopia.  Degenerative changes are noted in the upper cervical spine.  Flow is present in the anterior circulation.  Fetal type posterior cerebral arteries are seen bilaterally.  The basilar artery is small.  The patient is status post bilateral lens extractions.  The paranasal sinuses are clear.  A small right mastoid effusion is evident.  No obstructing nasopharyngeal lesion is present.  IMPRESSION:  1.  No acute intracranial abnormality or focal lesion to explain the patient's seizures. 2.  Remote to lacunar infarct of the right basal ganglion corona radiata is stable. 3.  Minimal right mastoid fluid without to an obstructing nasopharyngeal lesion.   Original Report Authenticated By: Marin Roberts, M.D.    Dg Chest Port 1 View  10/12/2012  *RADIOLOGY REPORT*  Clinical Data: Shortness of breath, congestion.  Recent pneumonia.  PORTABLE CHEST - 1 VIEW  Comparison: 10/04/2012  Findings: Heart is borderline in size.  Mild vascular congestion and peribronchial thickening.  No confluent opacities or effusions. No acute bony abnormality.  Prior left shoulder replacement.  IMPRESSION: Borderline heart size.  Vascular congestion.  Bronchitic changes.   Original Report Authenticated By: Charlett Nose, M.D.     Cardiac Studies: Telemetry shows NSR. Assessment/Plan:  1. Seizure       Workup unremarkable so far. 2D Echo results pending. 2. Hypertension     BP remains borderline high. Consider restarting her lisinopril per primary team. 3. Elevated troponins     Trending down. Will need outpatient Lexiscan Myoview after discharge.  LOS: 2 days    Martha Olsen 10/14/2012, 7:55 AM

## 2012-10-14 NOTE — Discharge Summary (Signed)
Physician Discharge Summary  Martha Olsen:[id number] DOB: 03/05/1936 DOA: 10/12/2012  PCP: Benita Stabile, MD  Admit date: 10/12/2012 Discharge date: 10/14/2012  Time spent: >30 minutes  Recommendations for Outpatient Follow-up:  Reassess BP and adjust medications BMET to follow electrolytes  Discharge Diagnoses:  Active Problems:   Seizure disorder   Elevated troponin I level   Diarrhea   Pneumonia   Hypokalemia   DM (diabetes mellitus)   HTN (hypertension)   Dyslipidemia   Discharge Condition: stable and improved. Will be discharged home with family care, outpatient follow up for stress test with cardiology, follow up with [facility] and follow up with PCP for further medication adjustments.  Diet recommendation: heart healthy and low carb diet  Filed Weights   10/12/12 1645 10/13/12 0602  Weight: 86.229 kg (190 lb 1.6 oz) 87.1 kg (192 lb 0.3 oz)    History of present illness:  77 year old female, with known history of HTN, s/p CVA right basal ganglia 11/2001, dyslipidemia, DM, anxiety/depression, s/p appendectomy, s/p hysterectomy, DJD, s/p left hip replacement 03/2002, left knee replacement 01/2011, foot surgery and reverse left shoulder arthroplasty 11/2011. Patient has retrograde amnesia for event, so history is obtained form family members and ED MD. She apparently lost her son about 2 weeks ago, and today in her home, was arranging flowers around the urn, when she said "Oh my", her eyes rolled back, she slumped in the chair, the hit her face on a closet door, as she slumped out of the chair and started convulsing like a rag doll, with all four limbs moving. Episode is said to have lasted around 5 minutes with intermittent periods of apnea. Patient was not incontinent and had no tongue-biting, although her tongue was said to have turned "purple" during the event. Family called EMS, and when they arrived, patient was confused and combative. She was brought to the  ED, and received Versed en route. Patient has no previous history of similar episodes. Patient developed pneumonia two weeks ago, was treated with a 10-day course of antibiotics, which she completed 2 days ago. Over the past 3 days, she has had nausea and watery diarrhea, but no vomiting or abdominal pain. No recent sick contact with a diarrheal illness.    Hospital Course:  1. Seizure disorder: Per neurology rec's.  -discharge on keppra BID -follow up with [facility] -follow EEG results. -MRI w/o acute abnormalities.   2. Elevated troponin I level: Troponin I peak at 1.4; risk factors includes, HTN, HLD, Diabetes and age; no EKG changes.  -2-D echo done and results will be follow by cardiology as an outpatient -will continue ASA, b-blockers, ACE and stress test at [facility] cardiology office. -Further cardiac enzymes continue trending down to normal and patient never complaints of CP or SOB.  3. Diarrhea: Patient has had chronic episodes of intermittent watery diarrhea, without specific abdominal pain or vomiting. Since she recently finish abx's treatment as an outpatient C. Diff by PCR was checked an neg.  -will treat with increase fiber in her diet and florastor BID.   4. Hypokalemia: Repleted   5. Pneumonia: Patient's CXR do not demonstrated any findings of new activity. Is afebrile and w/o SOB. Will hold on any further abx's treatment at this moment.   6. DM (diabetes mellitus): Type-2, and appears controlled, A1C 7.0. Continue home regimen. Advised to follow low carb diet  7. HTN (hypertension): BP is controlled at this time. Started on coreg and will continue lisinopril. Advised to  follow low sodium diet.   8. Dyslipidemia: continue fish oil. TG 175   Procedures: EEG (reading pending at discharge (will be follow by neurology) See below for x-ray 2-D echo (pending at discharge; [facility] cardiology will follow results)  Consultations:  Neurology  Discharge Exam: Filed Vitals:   10/13/12 2030  10/14/12 0542 10/14/12 0800 10/14/12 1200  BP: 111/52 148/74 147/62 137/74  Pulse: 70 84 73 79  Temp: 98.9 F (37.2 C) 98.3 F (36.8 C) 98.5 F (36.9 C) 98.6 F (37 C)  TempSrc: Oral Oral Oral Oral  Resp: 18 20 20 20   Height:      Weight:      SpO2: 97% 99% 100% 98%    General: AAOX2 (confused about place); no CP, no SOB Cardiovascular: S1 and S2, no rubs or gallops Respiratory: CTA Abdomen:soft, NT, ND and positive BS Extremities: no edema Neuro: non focal motor deficit.  Discharge Instructions  Discharge Orders   Future Orders Complete By Expires     Diet - low sodium heart healthy  As directed     Discharge instructions  As directed     Comments:      Follow up with PCP in 14 days Increase fiber in your diet and keep yourself hydrated Take medications as prescribed Follow up with neurology service as an outpatient in 2 weeks (call office to arrange appointment)        Medication List    TAKE these medications       ARIPiprazole 10 MG tablet  Commonly known as:  ABILIFY  Take 2 mg by mouth daily.     aspirin 81 MG chewable tablet  Chew 1 tablet (81 mg total) by mouth daily.     atorvastatin 40 MG tablet  Commonly known as:  LIPITOR  Take 40 mg by mouth daily.     busPIRone 15 MG tablet  Commonly known as:  BUSPAR  Take 15 mg by mouth 3 (three) times daily.     carvedilol 6.25 MG tablet  Commonly known as:  COREG  Take 1 tablet (6.25 mg total) by mouth 2 (two) times daily with a meal.     Fish Oil 1200 MG Caps  Take 2,400 mg by mouth 2 (two) times daily.     glimepiride 2 MG tablet  Commonly known as:  AMARYL  Take 2 mg by mouth daily before breakfast.     levETIRAcetam 500 MG tablet  Commonly known as:  KEPPRA  Take 1 tablet (500 mg total) by mouth 2 (two) times daily.     lisinopril 20 MG tablet  Commonly known as:  PRINIVIL,ZESTRIL  Take 20 mg by mouth daily.     LORazepam 1 MG tablet  Commonly known as:  ATIVAN  Take 0.5 mg by mouth  every 8 (eight) hours.     rOPINIRole 3 MG tablet  Commonly known as:  REQUIP  Take 3 mg by mouth at bedtime.     saccharomyces boulardii 250 MG capsule  Commonly known as:  FLORASTOR  Take 1 capsule (250 mg total) by mouth 2 (two) times daily.     sertraline 50 MG tablet  Commonly known as:  ZOLOFT  Take 75 mg by mouth daily.           Follow-up Information   Follow up with Benita Stabile, MD. Schedule an appointment as soon as possible for a visit in 2 weeks.   Contact information:   [facility] [address] [place] [address] [phone]       Schedule an appointment as soon as possible for a visit with [facility]. (to arrange follow up in 2 weeks)    Contact information:   [address] [place] [address] [phone]      The results of significant diagnostics from this hospitalization (including imaging, microbiology, ancillary and laboratory) are listed below for reference.    Significant Diagnostic Studies: Ct Head Wo Contrast   (if New Onset Seizure And/or Head Trauma)  10/12/2012  *RADIOLOGY REPORT*  Clinical Data: Seizures.  CT HEAD WITHOUT CONTRAST  Technique:  Contiguous axial images were obtained from the base of the skull through the vertex without contrast.  Comparison: 04/02/2008  Findings: There is an old infarct involving the right basal ganglia and right white matter tracts.  No evidence for acute hemorrhage, mass lesion, midline shift, hydrocephalus or new large infarct.  No acute bony abnormality.  IMPRESSION: No acute intracranial abnormality.  Old right basal ganglia infarct.   Original Report Authenticated By: Richarda Overlie, M.D.    Mr Brain Wo Contrast  10/13/2012  *RADIOLOGY REPORT*  Clinical Data: Seizure activity.  MRI HEAD WITHOUT CONTRAST  Technique:  Multiplanar, multiecho pulse sequences of the brain and surrounding structures were obtained according to  standard protocol without intravenous contrast.  Comparison: CT head without contrast 10/12/2012.  CT head without contrast 01/01/2006.  Findings: A remote lacunar infarct of the right putamen corona radiata is stable. The infarct contains remote blood products. Mild atrophy white matter disease is present bilaterally.  No acute infarct, hemorrhage, or mass lesion is present.  There is no significant heterotopia.  Degenerative changes are noted in the upper cervical spine.  Flow is present in the anterior circulation.  Fetal type posterior cerebral arteries are seen bilaterally.  The basilar artery is small.  The patient is status post bilateral lens extractions.  The paranasal sinuses are clear.  A small right mastoid effusion is evident.  No obstructing nasopharyngeal lesion is present.  IMPRESSION:  1.  No acute intracranial abnormality or focal lesion to explain the patient's seizures. 2.  Remote to lacunar infarct of the right basal ganglion corona radiata is stable. 3.  Minimal right mastoid fluid without to an obstructing nasopharyngeal lesion.   Original Report Authenticated By: Marin Roberts, M.D.    Dg Chest Port 1 View  10/12/2012  *RADIOLOGY REPORT*  Clinical Data: Shortness of breath, congestion.  Recent pneumonia.  PORTABLE CHEST - 1 VIEW  Comparison: 10/04/2012  Findings: Heart is borderline in size.  Mild vascular congestion and peribronchial thickening.  No confluent opacities or effusions. No acute bony abnormality.  Prior left shoulder replacement.  IMPRESSION: Borderline heart size.  Vascular congestion.  Bronchitic changes.   Original Report Authenticated By: Charlett Nose, M.D.     Microbiology: Recent Results (from the past 240 hour(s))  CLOSTRIDIUM DIFFICILE BY PCR     Status: None   Collection Time    10/13/12  5:47 AM      Result Value Range Status   C difficile by pcr NEGATIVE  NEGATIVE Final    Labs: Basic Metabolic Panel:  Recent Labs Lab 10/12/12 1402  10/12/12 1750 10/13/12 0500 10/14/12 0110  NA 139  --  141 138  K 3.4*  --  3.8 3.4*  CL 104  --  107 106  CO2 23  --  20 23  GLUCOSE 106*  --  99 116*  BUN 13  --  12 15  CREATININE 0.57 0.56 0.65 0.65  CALCIUM 9.3  --  8.9 8.3*  MG  --  2.0 2.0  --    Liver Function Tests:  Recent Labs Lab 10/13/12 0500  AST 38*  ALT 20  ALKPHOS 85  BILITOT 0.6  PROT 6.4  ALBUMIN 3.1*   CBC:  Recent Labs Lab 10/12/12 1402 10/12/12 1750 10/13/12 0500 10/14/12 0110  WBC 11.9* 12.8* 10.2 7.4  HGB 12.5 11.7* 11.4* 10.9*  HCT 36.5 34.3* 34.9* 31.9*  MCV 87.5 87.3 90.6 89.1  PLT 296 290 310 235   Cardiac Enzymes:  Recent Labs Lab 10/12/12 2321 10/12/12 2322 10/13/12 0453 10/13/12 0833 10/13/12 1150 10/13/12 1800 10/14/12 0110 10/14/12 0902  CKTOTAL  --  186*  --  187*  --  179* 162 154  CKMB  --  8.1*  --  6.4*  --  4.9* 4.5* 5.1*  TROPONINI 1.00*  --  0.65*  --  0.62* 0.41* 0.31*  --    CBG:  Recent Labs Lab 10/13/12 1118 10/13/12 1715 10/13/12 2053 10/14/12 0727 10/14/12 1138  GLUCAP 141* 148* 176* 142* 140*    Signed:  Asuncion Tapscott  [facility] 10/14/2012, 3:35 PM

## 2012-10-14 NOTE — Progress Notes (Signed)
Portable EEG completed

## 2012-10-15 NOTE — Procedures (Signed)
EEG report.  Brief clinical history:  77 years old female with admitted to the hospital after sustaining an isolated, witnessed GTC seizure at home. Prior history of stroke.  Technique: this is a 17 channel routine scalp EEG performed at the bedside with bipolar and monopolar montages arranged in accordance to the international 10/20 system of electrode placement. One channel was dedicated to EKG recording.  The study was performed during wakefulness and drowsiness. Intermittent photic stimulation was utilized as activating procedure.  Description:In the wakeful state, the best background consisted of a medium amplitude, posterior dominant, well sustained, symmetric and reactive 10 Hz rhythm. Drowsiness demonstrated dropout of the alpha rhythm. Infrequent, intermittent left temporal theta slowing noted. Intermittent photic stimulation did induce a normal driving response.  No focal or generalized epileptiform discharges noted.  EKG showed sinus rhythm.  Impression: this is a normal awake and drowsy EEG. The presence of infrequent left temporal theta slowing can be a normal phenomenon in this age population. Please, be aware that the absence of epileptiform discharges does not exclude the possibility of epilepsy.  Clinical correlation is advised.  Wyatt Portela, MD

## 2012-11-01 ENCOUNTER — Encounter: Payer: Self-pay | Admitting: Neurology

## 2012-11-01 ENCOUNTER — Ambulatory Visit (INDEPENDENT_AMBULATORY_CARE_PROVIDER_SITE_OTHER): Payer: Medicare Other | Admitting: Neurology

## 2012-11-01 VITALS — BP 174/83 | HR 80 | Ht 62.0 in | Wt 192.0 lb

## 2012-11-01 DIAGNOSIS — F32A Depression, unspecified: Secondary | ICD-10-CM | POA: Insufficient documentation

## 2012-11-01 DIAGNOSIS — F329 Major depressive disorder, single episode, unspecified: Secondary | ICD-10-CM

## 2012-11-01 DIAGNOSIS — I10 Essential (primary) hypertension: Secondary | ICD-10-CM

## 2012-11-01 DIAGNOSIS — F3289 Other specified depressive episodes: Secondary | ICD-10-CM

## 2012-11-01 DIAGNOSIS — F419 Anxiety disorder, unspecified: Secondary | ICD-10-CM

## 2012-11-01 DIAGNOSIS — F411 Generalized anxiety disorder: Secondary | ICD-10-CM

## 2012-11-01 DIAGNOSIS — R569 Unspecified convulsions: Secondary | ICD-10-CM | POA: Insufficient documentation

## 2012-11-01 DIAGNOSIS — I6350 Cerebral infarction due to unspecified occlusion or stenosis of unspecified cerebral artery: Secondary | ICD-10-CM

## 2012-11-01 DIAGNOSIS — I639 Cerebral infarction, unspecified: Secondary | ICD-10-CM | POA: Insufficient documentation

## 2012-11-01 MED ORDER — CARVEDILOL 6.25 MG PO TABS: 6.2500 mg | ORAL_TABLET | Freq: Two times a day (BID) | ORAL | Status: DC

## 2012-11-01 MED ORDER — LEVETIRACETAM 500 MG PO TABS: 500.0000 mg | ORAL_TABLET | Freq: Two times a day (BID) | ORAL | Status: DC

## 2012-11-01 NOTE — Progress Notes (Signed)
Martha Olsen is a 77 years old right-handed Caucasian female, accompanied by her husband, following most recent hospital discharge in March 29th   2014.  She has past medical history of HTN, s/p CVA right basal ganglia 11/2001, dyslipidemia, DM, anxiety/depression, s/p appendectomy, s/p hysterectomy, DJD, s/p left hip replacement 03/2002, left knee replacement 01/2011, foot surgery and reverse left shoulder arthroplasty 11/2011.  In October 13 2011, she was at home, sitting at the rocking chair at 11:00, was noted by her family fell over a chair, had generalized tonic-clonic seizure, patient has no warning signs, no collection of the event,  EMS was called, she was taken by ambulance to the hospital, she could not remember waking up in the hospital confused,  Patient has no previous history of similar episodes. Patient developed pneumonia two weeks ago, was treated with a 10-day course of antibiotics, which she completed 2 days ago. Over the past 3 days, she has had nausea and watery diarrhea, but no vomiting or abdominal pain. No recent   There was mild elevated troponin, she was seen by cardiologist, will continue followup  EEG was normal .MRI of the brain has demonstrated old basal ganglion, and coronal radiata stroke, no acute lesions,  She was put on Keppra 500 mg twice a day, she is tolerating Keppra well  She complains of headache everyday, right temporal regions, light noise is bothersome,   She has quit driving since 1610, husband retired, she is active at home, house work  PHYSICAL EXAMINATOINS:  Generalized: In no acute distress  Neck: Supple, no carotid bruits   Cardiac: Regular rate rhythm  Pulmonary: Clear to auscultation bilaterally  Musculoskeletal: No deformity  Neurological examination  Mentation: Alert oriented to time, place, history taking, and causual conversation  Cranial nerve II-XII: Pupils were equal round reactive to light extraocular movements were full, visual  field were full on confrontational test. facial sensation and strength were normal. hearing was intact to finger rubbing bilaterally. Uvula tongue midline.  head turning and shoulder shrug and were normal and symmetric.Tongue protrusion into cheek strength was normal.  Motor:  mild limitation on left shoulder due to pain, left arm pronation, proximal, distal left arm strength was 4/5.    Sensory: Intact to fine touch, pinprick, preserved vibratory sensation, and proprioception at toes.  Coordination: Normal finger to nose, heel-to-shin bilaterally there was no truncal ataxia  Gait: Rising up from seated position pushing on chair arm, bilateral knee valgus, cautious, small steps.  Romberg signs: Negative  Deep tendon reflexes: Brachioradialis 2/2, biceps 2/2, triceps 2/2, patellar 2/2, Achilles 1/1, plantar responses were flexor bilaterally.  Assessment and plan: 77 years old right-handed Caucasian female, with history of stroke, now presenting with seizure,  1 continue Keppra 500 mg twice a day 2 return to clinic in 3 months.

## 2012-11-05 ENCOUNTER — Encounter: Payer: Self-pay | Admitting: Cardiovascular Disease

## 2012-11-05 ENCOUNTER — Ambulatory Visit (INDEPENDENT_AMBULATORY_CARE_PROVIDER_SITE_OTHER): Payer: Medicare Other | Admitting: Cardiovascular Disease

## 2012-11-05 VITALS — BP 180/90 | HR 80 | Wt 194.0 lb

## 2012-11-05 DIAGNOSIS — R569 Unspecified convulsions: Secondary | ICD-10-CM

## 2012-11-05 NOTE — Assessment & Plan Note (Signed)
Both enzymes elevated with RWMA on echo.  Makes it possible that arrhythmia and SEMI caused event.  ECG normal QT normal.  F/U lexiscan myovue.  Cannot walk on treadmill due to previous left hip and knee replacement

## 2012-11-05 NOTE — Assessment & Plan Note (Signed)
Well controlled.  Continue current medications and low sodium Dash type diet.    

## 2012-11-05 NOTE — Assessment & Plan Note (Signed)
Cholesterol is at goal.  Continue current dose of statin and diet Rx.  No myalgias or side effects.  F/U  LFT's in 6 months. Lab Results  Component Value Date   LDLCALC 40 10/14/2012

## 2012-11-05 NOTE — Progress Notes (Signed)
Patient ID: Martha Olsen, female   DOB: 1936/04/10, 77 y.o.   MRN: [id number] 77 year old female, with known history of HTN, s/p CVA right basal ganglia 11/2001, dyslipidemia, DM, anxiety/depression, s/p appendectomy, s/p hysterectomy, DJD, s/p left hip replacement 03/2002, left knee replacement 01/2011, foot surgery and reverse left shoulder arthroplasty 11/2011  Admitted 3/29 with seizure . Patient has retrograde amnesia for event, so history is obtained form family members   She apparently lost her son about 5 weeks ago, and today in her home, was arranging flowers around the urn, when she said "Oh my", her eyes rolled back, she slumped in the chair, the hit her face on a closet door, as she slumped out of the chair and started convulsing like a rag doll, with all four limbs moving. Episode is said to have lasted around 5 minutes with intermittent periods of apnea. Patient was not incontinent and had no tongue-biting, although her tongue was said to have turned "purple" during the event. Family called EMS, and when they arrived, patient was confused and combative. She was brought to the ED, and received Versed en route. Patient has no previous history of similar episodes. Patient developed pneumonia two weeks ago, was treated with a 10-day course of antibiotics.  Troponin was elevated at 1.42 with mildly elevated CPK;s as well  She has no history of CAD and no chest pain. F/U with neuro Monday and continuing on Keppra  Echo reviewed from 3/30 Study Conclusions  - Left ventricle: The cavity size was normal. Systolic function was mildly to moderately reduced. The estimated ejection fraction was in the range of 40% to 45%. There is moderate hypokinesis of the distalapical myocardium. - Atrial septum: No defect or patent foramen ovale was identified.  EEG 4/1 was normal with no eliptiform foci  ROS: Denies fever, malais, weight loss, blurry vision, decreased visual acuity, cough, sputum, SOB, hemoptysis,  pleuritic pain, palpitaitons, heartburn, abdominal pain, melena, lower extremity edema, claudication, or rash.  All other systems reviewed and negative   General: Affect appropriate Healthy:  appears stated age HEENT: normal Neck supple with no adenopathy JVP normal no bruits no thyromegaly Lungs clear with no wheezing and good diaphragmatic motion Heart:  S1/S2 no murmur,rub, gallop or click PMI normal Abdomen: benighn, BS positve, no tenderness, no AAA no bruit.  No HSM or HJR Distal pulses intact with no bruits No edema Neuro non-focal Skin warm and dry No muscular weakness  Medications Current Outpatient Prescriptions  Medication Sig Dispense Refill  . ARIPiprazole (ABILIFY) 10 MG tablet Take 2 mg by mouth daily.      Marland Kitchen aspirin 81 MG chewable tablet Chew 1 tablet (81 mg total) by mouth daily.  30 tablet  2  . atorvastatin (LIPITOR) 40 MG tablet Take 40 mg by mouth daily.      . busPIRone (BUSPAR) 15 MG tablet Take 15 mg by mouth 3 (three) times daily.      . carvedilol (COREG) 6.25 MG tablet Take 1 tablet (6.25 mg total) by mouth 2 (two) times daily with a meal.  60 tablet  12  . doxycycline (VIBRAMYCIN) 100 MG capsule 100 mg daily.      Marland Kitchen glimepiride (AMARYL) 2 MG tablet Take 2 mg by mouth daily before breakfast.      . levETIRAcetam (KEPPRA) 500 MG tablet Take 1 tablet (500 mg total) by mouth 2 (two) times daily.  60 tablet  12  . Omega-3 Fatty Acids (FISH OIL) 1200 MG  CAPS Take 2,400 mg by mouth 2 (two) times daily.      Marland Kitchen PROAIR HFA 108 (90 BASE) MCG/ACT inhaler as needed.      Marland Kitchen rOPINIRole (REQUIP) 3 MG tablet Take 3 mg by mouth at bedtime.      . sertraline (ZOLOFT) 50 MG tablet Take 75 mg by mouth daily.       . simvastatin (ZOCOR) 80 MG tablet 80 mg daily.       No current facility-administered medications for this visit.    Allergies Codeine  Family History: Family History  Problem Relation Age of Onset  . Anesthesia problems Neg Hx   . CAD Father 36  .  Stroke Mother 53    Social History: History   Social History  . Marital Status: Married    Spouse Name: N/A    Number of Children: 4  . Years of Education: N/A   Occupational History  . Not on file.   Social History Main Topics  . Smoking status: Never Smoker   . Smokeless tobacco: Not on file  . Alcohol Use: No  . Drug Use: Not on file  . Sexually Active: Not on file   Other Topics Concern  . Not on file   Social History Narrative   Son recently died of cancer.  Lives at home with husband.  She has 3 children     Electrocardiogram: 4/1  Sinus tachycardia rate 101 normal   Assessment and Plan

## 2012-11-05 NOTE — Assessment & Plan Note (Signed)
Discussed low carb diet.  Target hemoglobin A1c is 6.5 or less.  Continue current medications.  

## 2012-11-05 NOTE — Patient Instructions (Addendum)
Your physician recommends that you schedule a follow-up appointment in: AS NEEDED  Your physician has requested that you have a lexiscan myoview. For further information please visit [email or web address]. Please follow instruction sheet, as given.    

## 2012-11-05 NOTE — Assessment & Plan Note (Signed)
EEG negative but neuro continuing Keppra given clinical history consistant with seizure.  ? Previous CVA as nidus

## 2012-11-07 ENCOUNTER — Ambulatory Visit: Payer: Medicare Other | Attending: Neurology | Admitting: *Deleted

## 2012-11-07 DIAGNOSIS — R279 Unspecified lack of coordination: Secondary | ICD-10-CM | POA: Insufficient documentation

## 2012-11-07 DIAGNOSIS — R269 Unspecified abnormalities of gait and mobility: Secondary | ICD-10-CM | POA: Insufficient documentation

## 2012-11-07 DIAGNOSIS — IMO0001 Reserved for inherently not codable concepts without codable children: Secondary | ICD-10-CM | POA: Insufficient documentation

## 2012-11-14 ENCOUNTER — Ambulatory Visit (HOSPITAL_COMMUNITY): Payer: Medicare Other | Attending: Cardiovascular Disease | Admitting: Radiology

## 2012-11-14 ENCOUNTER — Encounter (HOSPITAL_BASED_OUTPATIENT_CLINIC_OR_DEPARTMENT_OTHER): Payer: Self-pay | Admitting: *Deleted

## 2012-11-14 ENCOUNTER — Inpatient Hospital Stay (HOSPITAL_BASED_OUTPATIENT_CLINIC_OR_DEPARTMENT_OTHER)
Admission: RE | Admit: 2012-11-14 | Discharge: 2012-11-14 | Disposition: A | Discharge status: Home / Self Care | Payer: Medicare Other | Source: Ambulatory Visit | Attending: Cardiovascular Disease | Admitting: Cardiovascular Disease

## 2012-11-14 ENCOUNTER — Ambulatory Visit (INDEPENDENT_AMBULATORY_CARE_PROVIDER_SITE_OTHER): Payer: Medicare Other | Admitting: Cardiovascular Disease

## 2012-11-14 ENCOUNTER — Encounter: Payer: Self-pay | Admitting: *Deleted

## 2012-11-14 ENCOUNTER — Encounter: Payer: Self-pay | Admitting: Cardiovascular Disease

## 2012-11-14 ENCOUNTER — Encounter (HOSPITAL_BASED_OUTPATIENT_CLINIC_OR_DEPARTMENT_OTHER)
Admission: RE | Disposition: A | Discharge status: Home / Self Care | Payer: Self-pay | Source: Ambulatory Visit | Attending: Cardiovascular Disease

## 2012-11-14 VITALS — BP 130/88 | HR 77 | Resp 12

## 2012-11-14 VITALS — BP 115/63 | HR 75 | Ht 63.0 in | Wt 196.0 lb

## 2012-11-14 DIAGNOSIS — G40909 Epilepsy, unspecified, not intractable, without status epilepticus: Secondary | ICD-10-CM | POA: Insufficient documentation

## 2012-11-14 DIAGNOSIS — R9430 Abnormal result of cardiovascular function study, unspecified: Secondary | ICD-10-CM

## 2012-11-14 DIAGNOSIS — Z8249 Family history of ischemic heart disease and other diseases of the circulatory system: Secondary | ICD-10-CM | POA: Insufficient documentation

## 2012-11-14 DIAGNOSIS — R569 Unspecified convulsions: Secondary | ICD-10-CM

## 2012-11-14 DIAGNOSIS — R9431 Abnormal electrocardiogram [ECG] [EKG]: Secondary | ICD-10-CM

## 2012-11-14 DIAGNOSIS — I10 Essential (primary) hypertension: Secondary | ICD-10-CM | POA: Insufficient documentation

## 2012-11-14 DIAGNOSIS — E669 Obesity, unspecified: Secondary | ICD-10-CM | POA: Insufficient documentation

## 2012-11-14 DIAGNOSIS — R9439 Abnormal result of other cardiovascular function study: Secondary | ICD-10-CM

## 2012-11-14 DIAGNOSIS — R5381 Other malaise: Secondary | ICD-10-CM | POA: Insufficient documentation

## 2012-11-14 DIAGNOSIS — E119 Type 2 diabetes mellitus without complications: Secondary | ICD-10-CM

## 2012-11-14 DIAGNOSIS — Z01810 Encounter for preprocedural cardiovascular examination: Secondary | ICD-10-CM

## 2012-11-14 DIAGNOSIS — R778 Other specified abnormalities of plasma proteins: Secondary | ICD-10-CM

## 2012-11-14 DIAGNOSIS — R0989 Other specified symptoms and signs involving the circulatory and respiratory systems: Secondary | ICD-10-CM | POA: Insufficient documentation

## 2012-11-14 DIAGNOSIS — E785 Hyperlipidemia, unspecified: Secondary | ICD-10-CM | POA: Insufficient documentation

## 2012-11-14 DIAGNOSIS — R748 Abnormal levels of other serum enzymes: Secondary | ICD-10-CM | POA: Insufficient documentation

## 2012-11-14 DIAGNOSIS — Z8673 Personal history of transient ischemic attack (TIA), and cerebral infarction without residual deficits: Secondary | ICD-10-CM | POA: Insufficient documentation

## 2012-11-14 DIAGNOSIS — R0609 Other forms of dyspnea: Secondary | ICD-10-CM | POA: Insufficient documentation

## 2012-11-14 LAB — BASIC METABOLIC PANEL
GFR: 107.86 mL/min (ref >60.00)
Glucose, Bld: 89 mg/dL (ref 70–99)
Potassium: 4 mEq/L (ref 3.5–5.1)
Sodium: 136 mEq/L (ref 135–145)

## 2012-11-14 LAB — PROTIME-INR: INR: 1 ratio (ref 0.8–1.0)

## 2012-11-14 LAB — CBC WITH DIFFERENTIAL/PLATELET
Basophils Absolute: 0.1 10*3/uL (ref 0.0–0.1)
Eosinophils Relative: 2.5 % (ref 0.0–5.0)
MCV: 91.1 fl (ref 78.0–100.0)
Monocytes Absolute: 0.7 10*3/uL (ref 0.1–1.0)
Monocytes Relative: 6.6 % (ref 3.0–12.0)
Neutrophils Relative %: 61 % (ref 43.0–77.0)
Platelets: 224 10*3/uL (ref 150.0–400.0)
WBC: 10.1 10*3/uL (ref 4.5–10.5)

## 2012-11-14 SURGERY — JV LEFT HEART CATHETERIZATION WITH CORONARY ANGIOGRAM

## 2012-11-14 MED ORDER — SODIUM CHLORIDE 0.9 % IV SOLN
INTRAVENOUS | Status: DC
  Administered 2012-11-14: 12:00:00 via INTRAVENOUS

## 2012-11-14 MED ORDER — ONDANSETRON HCL 4 MG/2ML IJ SOLN: 4.0000 mg | Freq: Four times a day (QID) | INTRAMUSCULAR | Status: DC | PRN

## 2012-11-14 MED ORDER — ACETAMINOPHEN 325 MG PO TABS: 650.0000 mg | ORAL_TABLET | ORAL | Status: DC | PRN

## 2012-11-14 MED ORDER — TECHNETIUM TC 99M SESTAMIBI GENERIC - CARDIOLITE
11.0000 | Freq: Once | INTRAVENOUS | Status: AC | PRN
  Administered 2012-11-14: 11 via INTRAVENOUS

## 2012-11-14 MED ORDER — SODIUM CHLORIDE 0.9 % IV SOLN: 1.0000 mL/kg/h | INTRAVENOUS | Status: DC

## 2012-11-14 NOTE — Progress Notes (Signed)
Martha Olsen [facility] SITE 3 NUCLEAR MED [address] [place], [place] [address] [phone]    Cardiology Nuclear Med Study  TAVI GAUGHRAN is a 77 y.o. female     MRN : [id number]     DOB: 03/14/1936  Procedure Date: 11/14/2012  Nuclear Med Background Indication for Stress Test:  Evaluation for Ischemia History:  3/14 Echo:EF=45%, moderate hypokanesis; 10/12/12 Hospital Admit for seizure disorder with elevated cardiac enzymes. Cardiac Risk Factors: CVA, Family History - CAD, Hypertension, Lipids, NIDDM and Obesity  Symptoms:  DOE and Chronic Fatigue   Nuclear Pre-Procedure Caffeine/Decaff Intake:  None NPO After: 6:30 pm   Lungs:  clear O2 Sat: 96% on room air. IV 0.9% NS with Angio Cath:  22g  IV Site: R Hand  IV Started by:  Bonnita Levan, RN  Chest Size (in):  38 Cup Size: DD  Height: 5\' 3"  (1.6 m)  Weight:  196 lb (88.905 kg)  BMI:  Body mass index is 34.73 kg/(m^2). Tech Comments:  Patient held Coreg and Diabetic Med's this am    Nuclear Med Study 1 or 2 day study: 1 day-Rest Only  Stress Test Type:  Lexiscan  Reading MD: Cassell Clement, MD  Order Authorizing Provider:  Charlton Haws, MD  Resting Radionuclide: Technetium 10m Sestamibi  Resting Radionuclide Dose: 11.0 mCi   Stress Radionuclide:  n/a  Stress Radionuclide Dose: n/a           Stress Protocol Rest HR: 75 Stress HR: n/a  Rest BP: 115/63 Stress BP: n/a  Exercise Time (min): n/a METS: n/a           Dose of Adenosine (mg):  n/a Dose of Lexiscan: n/a mg  Dose of Atropine (mg): n/a Dose of Dobutamine: n/a mcg/kg/min (at max HR)  Stress Test Technologist: Smiley Houseman, CMA-N  Nuclear Technologist:  Domenic Polite, CNMT     Rest Procedure:  Myocardial perfusion imaging was performed at rest 45 minutes following the intravenous administration of Technetium 79m Sestamibi.  Rest ECG: Resting study showed widespread deep T wave inversion in II, avF, and V2-V5.  Stress Procedure:  Stress procedure  cancelled due to new EKG changes.  Patient sent to Cath Lab instead.  Stress ECG: n/a  QPS Raw Data Images:  Normal; no motion artifact; normal heart/lung ratio. Stress Images:  n/a Rest Images:  Normal homogeneous uptake in all areas of the myocardium. Subtraction (SDS):  n/a Transient Ischemic Dilatation (Normal <1.22):  n/a Lung/Heart Ratio (Normal <0.45):  0.32  Quantitative Gated Spect Images QGS EDV:  n/a QGS ESV:  n/a  Impression Exercise Capacity:  n/a BP Response:  n/a Clinical Symptoms:  n/a ECG Impression:  Anterior T wave inversion suggestive of ischemia. Comparison with Prior Nuclear Study: No previous nuclear study performed  Overall Impression:  Because of the resting EKG changes the stress test was terminated after the resting pictures were obtained and the patient was sent to the hospital for cath.  LV Ejection Fraction: n/a.  LV Wall Motion:  n/a   Cassell Clement

## 2012-11-14 NOTE — Progress Notes (Signed)
Patient ID: Martha Olsen, female   DOB: 03/16/1936, 77 y.o.   MRN: [id number] 77 year old female, with known history of HTN, s/p CVA right basal ganglia 11/2001, dyslipidemia, DM, anxiety/depression, s/p appendectomy, s/p hysterectomy, DJD, s/p left hip replacement 03/2002, left knee replacement 01/2011, foot surgery and reverse left shoulder arthroplasty 11/2011 Admitted 3/29 with seizure . Patient has retrograde amnesia for event, so history is obtained form family members She apparently lost her son about 5 weeks ago, and today in her home, was arranging flowers around the urn, when she said "Oh my", her eyes rolled back, she slumped in the chair, the hit her face on a closet door, as she slumped out of the chair and started convulsing like a rag doll, with all four limbs moving. Episode is said to have lasted around 5 minutes with intermittent periods of apnea. Patient was not incontinent and had no tongue-biting, although her tongue was said to have turned "purple" during the event. Family called EMS, and when they arrived, patient was confused and combative. She was brought to the ED, and received Versed en route. Patient has no previous history of similar episodes. Patient developed pneumonia two weeks ago, was treated with a 10-day course of antibiotics. Troponin was elevated at 1.42 with mildly elevated CPK;s as well She has no history of CAD and no chest pain. F/U with neuro Monday and continuing on Keppra  Echo reviewed from 3/30  Study Conclusions  - Left ventricle: The cavity size was normal. Systolic function was mildly to moderately reduced. The estimated ejection fraction was in the range of 40% to 45%. There is moderate hypokinesis of the distalapical myocardium. - Atrial septum: No defect or patent foramen ovale was identified.  EEG 4/1 was normal with no eliptiform foci  Was scheduled for myovue today. However ECG at baseline markedly different with deep symetrical T wave inversions  laterally and across precordium.  Discussed with patient my concern about stressing her with this ECG especially in light of her recent hospitalization and RWMA on echo.  Favor diagnostic cath today Risks discussed willing to proceed  ROS: Denies fever, malais, weight loss, blurry vision, decreased visual acuity, cough, sputum, SOB, hemoptysis, pleuritic pain, palpitaitons, heartburn, abdominal pain, melena, lower extremity edema, claudication, or rash.  All other systems reviewed and negative  General: Affect appropriate Healthy:  appears stated age HEENT: normal Neck supple with no adenopathy JVP normal no bruits no thyromegaly Lungs clear with no wheezing and good diaphragmatic motion Heart:  S1/S2 no murmur, no rub, gallop or click PMI normal Abdomen: benighn, BS positve, no tenderness, no AAA no bruit.  No HSM or HJR Distal pulses intact with no bruits No edema Neuro non-focal Skin warm and dry No muscular weakness   Current Outpatient Prescriptions  Medication Sig Dispense Refill  . ARIPiprazole (ABILIFY) 10 MG tablet Take 2 mg by mouth daily.      . aspirin 81 MG chewable tablet Chew 1 tablet (81 mg total) by mouth daily.  30 tablet  2  . atorvastatin (LIPITOR) 40 MG tablet Take 40 mg by mouth daily.      . busPIRone (BUSPAR) 15 MG tablet Take 15 mg by mouth 3 (three) times daily.      . carvedilol (COREG) 6.25 MG tablet Take 1 tablet (6.25 mg total) by mouth 2 (two) times daily with a meal.  60 tablet  12  . glimepiride (AMARYL) 2 MG tablet Take 2 mg by mouth daily before   breakfast.      . levETIRAcetam (KEPPRA) 500 MG tablet Take 1 tablet (500 mg total) by mouth 2 (two) times daily.  60 tablet  12  . Omega-3 Fatty Acids (FISH OIL) 1200 MG CAPS Take 2,400 mg by mouth 2 (two) times daily.      . PROAIR HFA 108 (90 BASE) MCG/ACT inhaler as needed.      . rOPINIRole (REQUIP) 3 MG tablet Take 3 mg by mouth at bedtime.      . sertraline (ZOLOFT) 50 MG tablet Take 75 mg by mouth  daily.        No current facility-administered medications for this visit.    Allergies  Codeine  Electrocardiogram: Today NSR deep T wave inversion precordium new since 3/30  Assessment and Plan  

## 2012-11-14 NOTE — Assessment & Plan Note (Signed)
Well controlled.  Continue current medications and low sodium Dash type diet.    

## 2012-11-14 NOTE — Assessment & Plan Note (Signed)
Recent hospitalization with syncope/seizure chest pain. Echo with EF 40-45% and RWMA;s ECG with marked ECG changes today. No chest pain now. May be takatsubo type repolarization abnormality but needs cath. Will arrange for today Called admitting and husband will transport Labs drawn in nuclear lab.  Discussed with Dr Excell Seltzer who will do procedure

## 2012-11-14 NOTE — Assessment & Plan Note (Signed)
Discussed low carb diet.  Target hemoglobin A1c is 6.5 or less.  Continue current medications.  

## 2012-11-14 NOTE — H&P (View-Only) (Signed)
Patient ID: Martha Olsen, female   DOB: 1935/08/04, 77 y.o.   MRN: [id number] 77 year old female, with known history of HTN, s/p CVA right basal ganglia 11/2001, dyslipidemia, DM, anxiety/depression, s/p appendectomy, s/p hysterectomy, DJD, s/p left hip replacement 03/2002, left knee replacement 01/2011, foot surgery and reverse left shoulder arthroplasty 11/2011 Admitted 3/29 with seizure . Patient has retrograde amnesia for event, so history is obtained form family members She apparently lost her son about 5 weeks ago, and today in her home, was arranging flowers around the urn, when she said "Oh my", her eyes rolled back, she slumped in the chair, the hit her face on a closet door, as she slumped out of the chair and started convulsing like a rag doll, with all four limbs moving. Episode is said to have lasted around 5 minutes with intermittent periods of apnea. Patient was not incontinent and had no tongue-biting, although her tongue was said to have turned "purple" during the event. Family called EMS, and when they arrived, patient was confused and combative. She was brought to the ED, and received Versed en route. Patient has no previous history of similar episodes. Patient developed pneumonia two weeks ago, was treated with a 10-day course of antibiotics. Troponin was elevated at 1.42 with mildly elevated CPK;s as well She has no history of CAD and no chest pain. F/U with neuro Monday and continuing on Keppra  Echo reviewed from 3/30  Study Conclusions  - Left ventricle: The cavity size was normal. Systolic function was mildly to moderately reduced. The estimated ejection fraction was in the range of 40% to 45%. There is moderate hypokinesis of the distalapical myocardium. - Atrial septum: No defect or patent foramen ovale was identified.  EEG 4/1 was normal with no eliptiform foci  Was scheduled for myovue today. However ECG at baseline markedly different with deep symetrical T wave inversions  laterally and across precordium.  Discussed with patient my concern about stressing her with this ECG especially in light of her recent hospitalization and RWMA on echo.  Favor diagnostic cath today Risks discussed willing to proceed  ROS: Denies fever, malais, weight loss, blurry vision, decreased visual acuity, cough, sputum, SOB, hemoptysis, pleuritic pain, palpitaitons, heartburn, abdominal pain, melena, lower extremity edema, claudication, or rash.  All other systems reviewed and negative  General: Affect appropriate Healthy:  appears stated age HEENT: normal Neck supple with no adenopathy JVP normal no bruits no thyromegaly Lungs clear with no wheezing and good diaphragmatic motion Heart:  S1/S2 no murmur, no rub, gallop or click PMI normal Abdomen: benighn, BS positve, no tenderness, no AAA no bruit.  No HSM or HJR Distal pulses intact with no bruits No edema Neuro non-focal Skin warm and dry No muscular weakness   Current Outpatient Prescriptions  Medication Sig Dispense Refill  . ARIPiprazole (ABILIFY) 10 MG tablet Take 2 mg by mouth daily.      Marland Kitchen aspirin 81 MG chewable tablet Chew 1 tablet (81 mg total) by mouth daily.  30 tablet  2  . atorvastatin (LIPITOR) 40 MG tablet Take 40 mg by mouth daily.      . busPIRone (BUSPAR) 15 MG tablet Take 15 mg by mouth 3 (three) times daily.      . carvedilol (COREG) 6.25 MG tablet Take 1 tablet (6.25 mg total) by mouth 2 (two) times daily with a meal.  60 tablet  12  . glimepiride (AMARYL) 2 MG tablet Take 2 mg by mouth daily before  breakfast.      . levETIRAcetam (KEPPRA) 500 MG tablet Take 1 tablet (500 mg total) by mouth 2 (two) times daily.  60 tablet  12  . Omega-3 Fatty Acids (FISH OIL) 1200 MG CAPS Take 2,400 mg by mouth 2 (two) times daily.      Marland Kitchen PROAIR HFA 108 (90 BASE) MCG/ACT inhaler as needed.      Marland Kitchen rOPINIRole (REQUIP) 3 MG tablet Take 3 mg by mouth at bedtime.      . sertraline (ZOLOFT) 50 MG tablet Take 75 mg by mouth  daily.        No current facility-administered medications for this visit.    Allergies  Codeine  Electrocardiogram: Today NSR deep T wave inversion precordium new since 3/30  Assessment and Plan

## 2012-11-14 NOTE — Patient Instructions (Signed)
Your physician has requested that you have a cardiac catheterization. Cardiac catheterization is used to diagnose and/or treat various heart conditions. Doctors may recommend this procedure for a number of different reasons. The most common reason is to evaluate chest pain. Chest pain can be a symptom of coronary artery disease (CAD), and cardiac catheterization can show whether plaque is narrowing or blocking your heart's arteries. This procedure is also used to evaluate the valves, as well as measure the blood flow and oxygen levels in different parts of your heart. For further information please visit [email or web address] Please follow instruction sheet, as given.  [facility]  CODE  FOR PARKING GARAGE IS  0010

## 2012-11-14 NOTE — CV Procedure (Signed)
   Cardiac Catheterization Procedure Note  Name: Martha Olsen MRN: [id number] DOB: 1936-01-30  Procedure: Left Heart Cath, Selective Coronary Angiography, LV angiography  Indication: Elevated cardiac markers, ischemic EKG changes, LV dysfunction   Procedural Details: The right wrist was prepped, draped, and anesthetized with 1% lidocaine. Using the modified Seldinger technique, a 5 French sheath was introduced into the right radial artery. 3 mg of verapamil was administered through the sheath, weight-based unfractionated heparin was administered intravenously. Standard Judkins catheters were used for selective coronary angiography and left ventriculography. Catheter exchanges were performed over an exchange length guidewire. There were no immediate procedural complications. A TR band was used for radial hemostasis at the completion of the procedure.  The patient was transferred to the post catheterization recovery area for further monitoring.  Procedural Findings: Hemodynamics: AO 179/27 LV 171/68  Coronary angiography: Coronary dominance: right  Left mainstem: Short left main, no disease  Left anterior descending (LAD): Patent to distal anterior wall. LAD and diagonal branches patent without disease  Left circumflex (LCx): Tiny intermediate branch with ostial narrowing, AV circumflex supplies large OM with no significant disease.  Right coronary artery (RCA): Dominant vessel. No significant angiographic disease. PDA moderate in caliber, PLA branches small without disease.  Left ventriculography: Left ventricular systolic function is normal, LVEF is estimated at 55-65%, there is no significant mitral regurgitation   Final Conclusions:   1. Widely patent coronary arteries with exception of an ostial lesion in a tiny intermediate branch 2. Normal LV function  Tonny Bollman 11/14/2012, 1:34 PM

## 2012-11-14 NOTE — Interval H&P Note (Signed)
History and Physical Interval Note:  11/14/2012 12:38 PM  Martha Olsen  has presented today for surgery, with the diagnosis of cp  The various methods of treatment have been discussed with the patient and family. After consideration of risks, benefits and other options for treatment, the patient has consented to  Procedure(s): JV LEFT HEART CATHETERIZATION WITH CORONARY ANGIOGRAM (N/A) as a surgical intervention .  The patient's history has been reviewed, patient examined, no change in status, stable for surgery.  I have reviewed the patient's chart and labs.  Questions were answered to the patient's satisfaction.     Tonny Bollman

## 2012-11-14 NOTE — Progress Notes (Signed)
Pt received from cath procedure,  Alert and oriented, denies any discomfort at this time.  Dr cooper in to talk to pt and family about the results of test and plan of care.

## 2012-11-17 ENCOUNTER — Telehealth: Payer: Self-pay | Admitting: Cardiology

## 2012-11-17 NOTE — Telephone Encounter (Signed)
Mr. Martha Olsen called on behalf of his wife today to report her BP has been running too high. She underwent a cardiac cath on 11/14/2012 which revealed normal coronary arteries and normal LV function. Mr. Martha Olsen states her BP was 212/110 on Friday and 210/95 yesterday. This AM it is 183/95. She is asymptomatic and feels well. She denies HA, difficult mentation, slurred speech, visual changes, ataxia or difficulty moving extremities. She denies CP or SOB. Mr. Martha Olsen states she was taking lisinopril 20 mg once daily but thought she was supposed to discontinue this medication when Dr. Eden Emms started Coreg during a recent hospitalization in March 2014. She is now only taking Coreg 6.25 mg BID. All of the progress notes state to restart lisinopril in addition to Coreg for better BP control and the DC summary also lists lisinopril and Coreg as continued medications at discharge. She has no history of renal dysfunction. Her most recent BMET from 11/14/2012 is normal - sodium 136, potassium 4.0, BUN 19, Cr 0.6. I instructed her to resume lisinopril and continue Coreg as previously directed. She will follow-up in our office tomorrow for BP check and nurse visit to review medications, 11/18/2012 at 11:15 AM. They expressed verbal understanding and agree with this plan of care.

## 2012-11-18 ENCOUNTER — Ambulatory Visit (INDEPENDENT_AMBULATORY_CARE_PROVIDER_SITE_OTHER): Payer: Medicare Other | Admitting: *Deleted

## 2012-11-18 VITALS — BP 140/80 | HR 68 | Resp 18 | Wt 200.0 lb

## 2012-11-18 DIAGNOSIS — I10 Essential (primary) hypertension: Secondary | ICD-10-CM

## 2012-11-18 NOTE — Progress Notes (Signed)
Patient presents ambulatory from lobby with a cane for a BP check and nurse visit.  Patient's husband accompanies her.  Patient's gait is steady and she is alert and oriented x 4.  Patient denies dizziness, chest pain, SOB, or any other symptoms.  She states her BP at her home this morning was 140/78.  Patient's BP in the office is 140/80, pulse 68.  Patient states she feels well and I verified that she is taking all medications as prescribed, including Coreg 6.25 mg BID and Lisinopril 20 mg QD.  Patient is wearing a black splint on her right wrist and asked when she could remove it.  She informed me that it was applied in the hospital after the cath.  I asked patient to remove the splint so that I could check her wrist.  There is no swelling at the insertion site and minimal bruising.  Patient denies pain at the site and I informed her that she could remove the splint.  Patient verbalized understanding that I would forward this note to Dr. Eden Emms and Wynona Canes and that we would call her if there were any further changes that he wanted to make and for patient to call us if she experiences elevated BP or any other cardiac symptoms.  Patient ambulated to exit with her husband. This note was completed by Eligha Bridegroom, RN.  The provider is listed as Dennis Bast, RN as it was not an option to assign patient to myself in the provider list.

## 2012-11-19 NOTE — Progress Notes (Signed)
NOTED ./CY 

## 2012-11-20 ENCOUNTER — Ambulatory Visit: Payer: Medicare Other | Attending: Neurology | Admitting: *Deleted

## 2012-11-20 ENCOUNTER — Ambulatory Visit: Payer: Medicare Other | Admitting: Occupational Therapy

## 2012-11-20 DIAGNOSIS — IMO0001 Reserved for inherently not codable concepts without codable children: Secondary | ICD-10-CM | POA: Insufficient documentation

## 2012-11-20 DIAGNOSIS — R269 Unspecified abnormalities of gait and mobility: Secondary | ICD-10-CM | POA: Insufficient documentation

## 2012-11-20 DIAGNOSIS — R279 Unspecified lack of coordination: Secondary | ICD-10-CM | POA: Insufficient documentation

## 2012-11-21 ENCOUNTER — Ambulatory Visit: Payer: Medicare Other

## 2012-11-21 ENCOUNTER — Encounter: Payer: Medicare Other | Admitting: Occupational Therapy

## 2012-11-22 ENCOUNTER — Ambulatory Visit: Payer: Medicare Other | Admitting: *Deleted

## 2012-11-25 ENCOUNTER — Ambulatory Visit: Payer: Medicare Other | Admitting: Occupational Therapy

## 2012-11-25 ENCOUNTER — Ambulatory Visit: Payer: Medicare Other | Admitting: Physical Therapy

## 2012-11-27 ENCOUNTER — Ambulatory Visit: Payer: Medicare Other | Admitting: Occupational Therapy

## 2012-11-27 ENCOUNTER — Ambulatory Visit: Payer: Medicare Other | Admitting: *Deleted

## 2012-12-03 ENCOUNTER — Ambulatory Visit: Payer: Medicare Other | Admitting: Occupational Therapy

## 2012-12-03 ENCOUNTER — Ambulatory Visit: Payer: Medicare Other | Admitting: Physical Therapy

## 2012-12-05 ENCOUNTER — Ambulatory Visit: Payer: Medicare Other | Admitting: Occupational Therapy

## 2012-12-05 ENCOUNTER — Ambulatory Visit: Payer: Medicare Other

## 2012-12-11 ENCOUNTER — Ambulatory Visit: Payer: Medicare Other | Admitting: *Deleted

## 2012-12-11 ENCOUNTER — Ambulatory Visit: Payer: Medicare Other | Admitting: Occupational Therapy

## 2012-12-11 ENCOUNTER — Ambulatory Visit (INDEPENDENT_AMBULATORY_CARE_PROVIDER_SITE_OTHER): Payer: Medicare Other | Admitting: Cardiovascular Disease

## 2012-12-11 ENCOUNTER — Encounter: Payer: Self-pay | Admitting: Cardiovascular Disease

## 2012-12-11 VITALS — BP 150/72 | HR 96 | Ht 63.0 in | Wt 199.0 lb

## 2012-12-11 DIAGNOSIS — I2510 Atherosclerotic heart disease of native coronary artery without angina pectoris: Secondary | ICD-10-CM

## 2012-12-11 DIAGNOSIS — E785 Hyperlipidemia, unspecified: Secondary | ICD-10-CM

## 2012-12-11 DIAGNOSIS — E119 Type 2 diabetes mellitus without complications: Secondary | ICD-10-CM

## 2012-12-11 MED ORDER — NITROGLYCERIN 0.4 MG SL SUBL: 0.4000 mg | SUBLINGUAL_TABLET | SUBLINGUAL | Status: DC | PRN

## 2012-12-11 NOTE — Assessment & Plan Note (Signed)
Discussed low carb diet.  Target hemoglobin A1c is 6.5 or less.  Continue current medications.  

## 2012-12-11 NOTE — Patient Instructions (Addendum)
Your physician has recommended you make the following change in your medication: start using Nitroglycerin 0.4 mg as needed for chest pain  Your physician wants you to follow-up in: 1 year. You will receive a reminder letter in the mail two months in advance. If you don't receive a letter, please call our office to schedule the follow-up appointment.

## 2012-12-11 NOTE — Assessment & Plan Note (Signed)
Cholesterol is at goal.  Continue current dose of statin and diet Rx.  No myalgias or side effects.  F/U  LFT's in 6 months. Lab Results  Component Value Date   LDLCALC 40 10/14/2012             

## 2012-12-11 NOTE — Progress Notes (Signed)
Patient ID: Martha Olsen, female   DOB: 12-01-1935, 77 y.o.   MRN: [id number] 77 yo with history of syncope ? Seizure.  Abnormal ECG with labile anterior T waves.  However cath showed no critical disease and medical Rx warranted No pain since discharge BP meds adjusted by Dr Clovis Riley Going to neuro rehab for weakness post seizure  Cath 5/1  Dr Excell Seltzer Final Conclusions:  1. Widely patent coronary arteries with exception of an ostial lesion in a tiny intermediate branch  2. Normal LV function  Echo reviewed from 3/30  Study Conclusions  - Left ventricle: The cavity size was normal. Systolic function was mildly to moderately reduced. The estimated ejection fraction was in the range of 40% to 45%. There is moderate hypokinesis of the distalapical myocardium. - Atrial septum: No defect or patent foramen ovale was identified.   ROS: Denies fever, malais, weight loss, blurry vision, decreased visual acuity, cough, sputum, SOB, hemoptysis, pleuritic pain, palpitaitons, heartburn, abdominal pain, melena, lower extremity edema, claudication, or rash.  All other systems reviewed and negative  General: Affect appropriate Obese white female HEENT: normal Neck supple with no adenopathy JVP normal no bruits no thyromegaly Lungs clear with no wheezing and good diaphragmatic motion Heart:  S1/S2 no murmur, no rub, gallop or click PMI normal Abdomen: benighn, BS positve, no tenderness, no AAA no bruit.  No HSM or HJR Distal pulses intact with no bruits No edema Neuro non-focal Skin warm and dry No muscular weakness   Current Outpatient Prescriptions  Medication Sig Dispense Refill  . ARIPiprazole (ABILIFY) 10 MG tablet Take 2 mg by mouth daily.      Marland Kitchen aspirin 81 MG chewable tablet Chew 1 tablet (81 mg total) by mouth daily.  30 tablet  2  . atorvastatin (LIPITOR) 40 MG tablet Take 40 mg by mouth daily.      . busPIRone (BUSPAR) 15 MG tablet Take 15 mg by mouth 3 (three) times daily.       . carvedilol (COREG) 6.25 MG tablet Take 1 tablet (6.25 mg total) by mouth 2 (two) times daily with a meal.  60 tablet  12  . glimepiride (AMARYL) 2 MG tablet Take 2 mg by mouth daily before breakfast.      . levETIRAcetam (KEPPRA) 500 MG tablet Take 1 tablet (500 mg total) by mouth 2 (two) times daily.  60 tablet  12  . Omega-3 Fatty Acids (FISH OIL) 1200 MG CAPS Take 2,400 mg by mouth 2 (two) times daily.      Marland Kitchen PROAIR HFA 108 (90 BASE) MCG/ACT inhaler as needed.      Marland Kitchen rOPINIRole (REQUIP) 3 MG tablet Take 3 mg by mouth at bedtime.      . sertraline (ZOLOFT) 50 MG tablet Take 75 mg by mouth daily.        No current facility-administered medications for this visit.    Allergies  Codeine  Electrocardiogram:  10/12/12  SR rate 108  Nonspecific T wave changes  Assessment and Plan

## 2012-12-11 NOTE — Assessment & Plan Note (Signed)
Well controlled.  Continue current medications and low sodium Dash type diet.   Diuretic recently increased continue ACE with DM

## 2012-12-11 NOTE — Assessment & Plan Note (Signed)
Stable with no angina and good activity level.  Continue medical Rx Small vessel ostial IM disease only Nitro called into [facility] on [address]

## 2012-12-13 ENCOUNTER — Encounter: Payer: Medicare Other | Admitting: Occupational Therapy

## 2012-12-13 ENCOUNTER — Ambulatory Visit: Payer: Medicare Other | Admitting: Physical Therapy

## 2012-12-16 ENCOUNTER — Encounter: Payer: Medicare Other | Admitting: Occupational Therapy

## 2012-12-16 ENCOUNTER — Ambulatory Visit: Payer: Medicare Other

## 2012-12-19 ENCOUNTER — Encounter: Payer: Medicare Other | Admitting: Occupational Therapy

## 2012-12-19 ENCOUNTER — Ambulatory Visit: Payer: Medicare Other

## 2012-12-24 ENCOUNTER — Ambulatory Visit: Payer: Medicare Other | Attending: Neurology | Admitting: Physical Therapy

## 2012-12-24 DIAGNOSIS — R269 Unspecified abnormalities of gait and mobility: Secondary | ICD-10-CM | POA: Insufficient documentation

## 2012-12-24 DIAGNOSIS — IMO0001 Reserved for inherently not codable concepts without codable children: Secondary | ICD-10-CM | POA: Insufficient documentation

## 2012-12-24 DIAGNOSIS — R279 Unspecified lack of coordination: Secondary | ICD-10-CM | POA: Insufficient documentation

## 2012-12-25 ENCOUNTER — Ambulatory Visit: Payer: Medicare Other | Admitting: Occupational Therapy

## 2013-01-31 ENCOUNTER — Encounter: Payer: Self-pay | Admitting: Neurology

## 2013-01-31 ENCOUNTER — Ambulatory Visit: Payer: Medicare Other | Admitting: Neurology

## 2013-01-31 ENCOUNTER — Ambulatory Visit (INDEPENDENT_AMBULATORY_CARE_PROVIDER_SITE_OTHER): Payer: Medicare Other | Admitting: Neurology

## 2013-01-31 VITALS — BP 159/66 | HR 73 | Ht 62.0 in | Wt 206.0 lb

## 2013-01-31 DIAGNOSIS — R569 Unspecified convulsions: Secondary | ICD-10-CM

## 2013-01-31 DIAGNOSIS — I639 Cerebral infarction, unspecified: Secondary | ICD-10-CM

## 2013-01-31 DIAGNOSIS — I6350 Cerebral infarction due to unspecified occlusion or stenosis of unspecified cerebral artery: Secondary | ICD-10-CM

## 2013-01-31 NOTE — Progress Notes (Signed)
Martha Olsen is a 77 years old right-handed Caucasian female, accompanied by her husband, following most recent hospital discharge in March 29th   77.  She has past medical history of HTN, s/p CVA right basal ganglia 11/2001, dyslipidemia, DM, anxiety/depression, s/p appendectomy, s/p hysterectomy, DJD, s/p left hip replacement 03/2002, left knee replacement 01/2011, foot surgery and reverse left shoulder arthroplasty 11/2011.  In October 13 2011, she was at home, sitting at the rocking chair at 11:00, was noted by her family fell over a chair, had generalized tonic-clonic seizure, patient has no warning signs, no collection of the event,  EMS was called, she was taken by ambulance to the hospital, she could not remember waking up in the hospital confused,  Patient has no previous history of similar episodes. Patient developed pneumonia two weeks ago, was treated with a 10-day course of antibiotics, which she completed 2 days ago. Over the past 3 days, she has had nausea and watery diarrhea, but no vomiting or abdominal pain. No recent   There was mild elevated troponin, she was seen by cardiologist, will continue followup  EEG was normal .MRI of the brain has demonstrated old basal ganglion, and coronal radiata stroke, no acute lesions,  She was put on Keppra 500 mg twice a day, she is tolerating Keppra well  She complains of headache everyday, right temporal regions, light noise is bothersome,   She has quit driving since 4098, husband retired, she is active at home, house work  UPDATE July 18th 2014:  She is overall doing well, no recurrent seizure, tolerating medications, she has mild gait difficulty due to left shoulder, left hip replacement.  PHYSICAL EXAMINATOINS:  Generalized: In no acute distress  Neck: Supple, no carotid bruits   Cardiac: Regular rate rhythm  Pulmonary: Clear to auscultation bilaterally  Musculoskeletal: No deformity  Neurological examination  Mentation:  Alert oriented to time, place, history taking, and causual conversation  Cranial nerve II-XII: Pupils were equal round reactive to light extraocular movements were full, visual field were full on confrontational test. facial sensation and strength were normal. hearing was intact to finger rubbing bilaterally. Uvula tongue midline.  head turning and shoulder shrug and were normal and symmetric.Tongue protrusion into cheek strength was normal.  Motor:  mild limitation on left shoulder due to pain, left arm pronation, proximal, distal left arm strength was 4/5.    Sensory: Intact to fine touch, pinprick, preserved vibratory sensation, and proprioception at toes.  Coordination: Normal finger to nose, heel-to-shin bilaterally there was no truncal ataxia  Gait: Rising up from seated position pushing on chair arm, bilateral knee valgus, cautious, small steps.  Romberg signs: Negative  Deep tendon reflexes: Brachioradialis 2/2, biceps 2/2, triceps 2/2, patellar 2/2, Achilles 1/1, plantar responses were flexor bilaterally.  Assessment and plan: 77 years old right-handed Caucasian female, with history of stroke, now presenting with complex partial seizure,  1 continue Keppra 500 mg twice a day 2 return to clinic in 12 months.

## 2013-02-24 ENCOUNTER — Other Ambulatory Visit: Payer: Self-pay

## 2013-02-24 DIAGNOSIS — Z1231 Encounter for screening mammogram for malignant neoplasm of breast: Secondary | ICD-10-CM

## 2013-04-01 ENCOUNTER — Ambulatory Visit
Admission: RE | Admit: 2013-04-01 | Discharge: 2013-04-01 | Disposition: A | Discharge status: Home / Self Care | Payer: Medicare Other | Source: Ambulatory Visit

## 2013-04-01 DIAGNOSIS — Z1231 Encounter for screening mammogram for malignant neoplasm of breast: Secondary | ICD-10-CM

## 2013-11-08 ENCOUNTER — Other Ambulatory Visit: Payer: Self-pay | Admitting: Neurology

## 2013-11-10 ENCOUNTER — Telehealth: Payer: Self-pay | Admitting: Nurse Practitioner

## 2013-11-10 ENCOUNTER — Other Ambulatory Visit: Payer: Self-pay | Admitting: Neurology

## 2013-11-10 MED ORDER — LEVETIRACETAM 500 MG PO TABS: 500.0000 mg | ORAL_TABLET | Freq: Two times a day (BID) | ORAL | Status: DC

## 2013-11-10 NOTE — Telephone Encounter (Signed)
Would you like to fill this med?  Please advise.  Thank you.

## 2013-11-10 NOTE — Telephone Encounter (Signed)
Keppra has been sent.  A message was sent to the provider regarding Carvedilol, as this is typically prescribed by PCP or Cardiologist.

## 2013-11-10 NOTE — Telephone Encounter (Signed)
Patient's husband calling for wife as she is needing refills of carvedilol and levetiracetam

## 2013-11-10 NOTE — Telephone Encounter (Signed)
Varbebilol  6.25 mg  - levetiracecam  500 mg   [facility] on [address] [phone]

## 2014-01-30 ENCOUNTER — Ambulatory Visit: Payer: Medicare Other | Admitting: Nurse Practitioner

## 2014-02-11 ENCOUNTER — Encounter (INDEPENDENT_AMBULATORY_CARE_PROVIDER_SITE_OTHER): Payer: Self-pay | Admitting: General Surgery

## 2014-02-11 ENCOUNTER — Ambulatory Visit (INDEPENDENT_AMBULATORY_CARE_PROVIDER_SITE_OTHER): Payer: Medicare Other | Admitting: General Surgery

## 2014-02-11 VITALS — BP 142/74 | HR 87 | Temp 97.5°F | Ht 64.0 in | Wt 202.0 lb

## 2014-02-11 DIAGNOSIS — D1779 Benign lipomatous neoplasm of other sites: Secondary | ICD-10-CM

## 2014-02-11 DIAGNOSIS — D1720 Benign lipomatous neoplasm of skin and subcutaneous tissue of unspecified limb: Secondary | ICD-10-CM

## 2014-02-11 NOTE — Progress Notes (Signed)
Chief complaint: Mass left arm  History: Patient is a 78 year old female self-referred for an enlarging and symptomatic mass of her upper left arm. She states this has been present for a number of years. It has been gradually enlarging. It has become tender and uncomfortable. No other similar areas anywhere else.  Past Medical History  Diagnosis Date  . Stroke 2002    right side weakness  . Hypertension   . Diabetes mellitus 2011  . Headache(784.0)   . Anxiety   . Depression   . Seizure    Past Surgical History  Procedure Laterality Date  . Foot surgery    . Cholecystectomy  1993  . Tonsillectomy  1951  . Eye surgery  2009  . Abdominal hysterectomy  1980's  . Appendectomy  1980's  . Joint replacement  2012    Hip, Knee  . Reverse shoulder arthroplasty  12/07/2011    Procedure: REVERSE SHOULDER ARTHROPLASTY;  Surgeon: Marin Shutter, MD;  Location: [facility];  Service: Orthopedics;  Laterality: Left;  left total reverse shoulder   Current Outpatient Prescriptions  Medication Sig Dispense Refill  . ARIPiprazole (ABILIFY) 10 MG tablet Take 2 mg by mouth daily.      Marland Kitchen aspirin 81 MG chewable tablet Chew 1 tablet (81 mg total) by mouth daily.  30 tablet  2  . atorvastatin (LIPITOR) 40 MG tablet Take 40 mg by mouth daily.      . busPIRone (BUSPAR) 15 MG tablet Take 15 mg by mouth 3 (three) times daily.      . carvedilol (COREG) 6.25 MG tablet take 1 tablet by mouth twice a day with meals  60 tablet  12  . glimepiride (AMARYL) 2 MG tablet Take 2 mg by mouth daily before breakfast.      . levETIRAcetam (KEPPRA) 500 MG tablet Take 1 tablet (500 mg total) by mouth 2 (two) times daily.  60 tablet  3  . lisinopril (PRINIVIL,ZESTRIL) 20 MG tablet       . lisinopril-hydrochlorothiazide (PRINZIDE,ZESTORETIC) 20-12.5 MG per tablet       . LORazepam (ATIVAN) 2 MG tablet       . metFORMIN (GLUCOPHAGE) 500 MG tablet       . nitroGLYCERIN (NITROSTAT) 0.4 MG SL tablet Place 1 tablet (0.4 mg total)  under the tongue every 5 (five) minutes as needed for chest pain.  25 tablet  3  . Omega-3 Fatty Acids (FISH OIL) 1200 MG CAPS Take 2,400 mg by mouth 2 (two) times daily.      Marland Kitchen PROAIR HFA 108 (90 BASE) MCG/ACT inhaler as needed.      Marland Kitchen rOPINIRole (REQUIP) 3 MG tablet Take 3 mg by mouth at bedtime.      . sertraline (ZOLOFT) 50 MG tablet Take 75 mg by mouth daily.        No current facility-administered medications for this visit.   History  Substance Use Topics  . Smoking status: Never Smoker   . Smokeless tobacco: Never Used  . Alcohol Use: No   Allergies  Allergen Reactions  . Codeine Hives   Review of systems: Gen.: Positive for some fatigue Respiratory: Mild dyspnea on exertion Cardiac: Denies chest pain or palpitations or history of cardiac disease Neurologic: Has history of seizures but none recently  Exam: BP 142/74  Pulse 87  Temp(Src) 97.5 F (36.4 C)  Ht 5\' 4"  (1.626 m)  Wt 202 lb (91.627 kg)  BMI 34.66 kg/m2 General: Obese Caucasian female no distress  Skin: No rash or infections extremities Lungs: Clear equal breath sounds without wheezing or increased work of breathing Cardiac: Regular rate and rhythm without murmur Extremities: On the left upper extremity laterally above the elbow is a discrete 7 x 5 cm fleshy slightly tender subcutaneous mass Neurologic: Alert fully oriented. Gait normal  Assessment and plan: Symptomatic enlarging subcutaneous mass left upper arm consistent with lipoma. As this is enlarging and causing her discomfort I think he would be indicated to remove this. We discussed the nature of the surgery. I would plan local anesthesia with sedation. Risks of medication reaction, bleeding, infection were discussed and understood. Should like to proceed.

## 2014-02-16 DIAGNOSIS — D1739 Benign lipomatous neoplasm of skin and subcutaneous tissue of other sites: Secondary | ICD-10-CM

## 2014-02-17 ENCOUNTER — Other Ambulatory Visit (INDEPENDENT_AMBULATORY_CARE_PROVIDER_SITE_OTHER): Payer: Self-pay

## 2014-02-17 MED ORDER — HYDROCODONE-ACETAMINOPHEN 5-325 MG PO TABS: 1.0000 | ORAL_TABLET | ORAL | Status: DC | PRN

## 2014-02-26 ENCOUNTER — Ambulatory Visit (INDEPENDENT_AMBULATORY_CARE_PROVIDER_SITE_OTHER): Payer: Medicare Other | Admitting: General Surgery

## 2014-02-26 VITALS — BP 134/72 | HR 86 | Temp 98.3°F | Ht 64.0 in | Wt 198.0 lb

## 2014-02-26 DIAGNOSIS — Z09 Encounter for follow-up examination after completed treatment for conditions other than malignant neoplasm: Secondary | ICD-10-CM

## 2014-02-26 NOTE — Progress Notes (Signed)
History: Patient returns about one week following excision of a large lipoma from her left arm. She reports no problems.  Exam: Left arm incision is very nicely healed without evidence of infection or swelling or other complications.  Assessment and plan: Doing well following excision of large lipoma from her left upper arm. She is advised to be a little careful of the area for the next couple of weeks and will return as needed.

## 2014-03-20 ENCOUNTER — Other Ambulatory Visit: Payer: Self-pay

## 2014-03-20 DIAGNOSIS — Z1231 Encounter for screening mammogram for malignant neoplasm of breast: Secondary | ICD-10-CM

## 2014-04-02 ENCOUNTER — Ambulatory Visit
Admission: RE | Admit: 2014-04-02 | Discharge: 2014-04-02 | Disposition: A | Discharge status: Home / Self Care | Payer: Medicare Other | Source: Ambulatory Visit

## 2014-04-02 DIAGNOSIS — Z1231 Encounter for screening mammogram for malignant neoplasm of breast: Secondary | ICD-10-CM

## 2014-07-13 ENCOUNTER — Telehealth: Payer: Self-pay | Admitting: Neurology

## 2014-07-13 MED ORDER — LEVETIRACETAM 500 MG PO TABS
500.0000 mg | ORAL_TABLET | Freq: Two times a day (BID) | ORAL | Status: DC
Start: 2014-07-13 — End: 2014-07-14

## 2014-07-13 NOTE — Telephone Encounter (Signed)
Patient requesting Rx refill for levETIRAcetam (KEPPRA) 500 MG tablet.  Refills have run completely out.  Please call and advise.

## 2014-07-13 NOTE — Telephone Encounter (Signed)
Patient has schedule appt.  Rx has been sent.

## 2014-07-13 NOTE — Telephone Encounter (Signed)
Patient has not been seen since July 2014, cancelled last appt.  I called back, go tno answer.  Left message.

## 2014-07-14 ENCOUNTER — Encounter: Payer: Self-pay | Admitting: Neurology

## 2014-07-14 ENCOUNTER — Ambulatory Visit (INDEPENDENT_AMBULATORY_CARE_PROVIDER_SITE_OTHER): Payer: Medicare Other | Admitting: Neurology

## 2014-07-14 VITALS — BP 169/76 | HR 94 | Ht 62.0 in | Wt 196.0 lb

## 2014-07-14 DIAGNOSIS — G4730 Sleep apnea, unspecified: Secondary | ICD-10-CM

## 2014-07-14 MED ORDER — LEVETIRACETAM 500 MG PO TABS: 500.0000 mg | ORAL_TABLET | Freq: Two times a day (BID) | ORAL | Status: DC

## 2014-07-14 MED ORDER — TRAMADOL HCL 50 MG PO TABS: 50.0000 mg | ORAL_TABLET | Freq: Four times a day (QID) | ORAL | Status: DC | PRN

## 2014-07-14 NOTE — Progress Notes (Signed)
Martha Olsen is a 78 years old right-handed Caucasian female, accompanied by her husband, following most recent hospital discharge in March 29th   2014.  She has past medical history of HTN, s/p CVA right basal ganglia 11/2001, dyslipidemia, DM, anxiety/depression, s/p appendectomy, s/p hysterectomy, DJD, s/p left hip replacement 03/2002, left knee replacement 01/2011, foot surgery and reverse left shoulder arthroplasty 11/2011.  In October 13 2011, she was at home, sitting at the rocking chair, was noted by her family fell over a chair, had generalized tonic-clonic seizure, patient has no warning signs, no collection of the event,  EMS was called, she was taken by ambulance to the hospital, she could not remember waking up in the hospital confused,  Patient has no previous history of similar episodes. Patient developed pneumonia two weeks ago, was treated with a 10-day course of antibiotics, which she completed 2 days ago. Over the past 3 days, she has had nausea and watery diarrhea, but no vomiting or abdominal pain. No recent   There was mild elevated troponin, she was seen by cardiologist, will continue followup  EEG was normal .MRI of the brain has demonstrated old basal ganglion, and coronal radiata stroke, no acute lesions,  She was put on Keppra 500 mg twice a day, she is tolerating Keppra well  She complains of headache everyday, right temporal regions, light noise is bothersome,   She has quit driving since [age], husband retired, she is active at home, house work  UPDATE July 18th 2014:  She is overall doing well, no recurrent seizure, tolerating medications, she has mild gait difficulty due to left shoulder, left hip replacement.  UPDATE Dec 29th 2015:  She has no recurrent seizures, tolerating Keppra without significant side effect, she has increased gait difficulty because of joints, knee pain,  She has been complaining of a year history of headaches, bilateral frontal, vortex  region, she has to take ibuprofen 200 mg 2-3 tablets each time, sometimes two dosage each day  PHYSICAL EXAMINATOINS:  Generalized: In no acute distress  Neck: Supple, no carotid bruits   Cardiac: Regular rate rhythm  Pulmonary: Clear to auscultation bilaterally  Musculoskeletal: No deformity  Neurological examination  Mentation: Alert oriented to time, place, history taking, and causual conversation  Cranial nerve II-XII: Pupils were equal round reactive to light extraocular movements were full, visual field were full on confrontational test. facial sensation and strength were normal. hearing was intact to finger rubbing bilaterally. Uvula tongue midline.  head turning and shoulder shrug and were normal and symmetric.Tongue protrusion into cheek strength was normal. She has narrow oropharyngeal   Motor:  mild limitation on left shoulder due to pain, left arm pronation, proximal, distal left arm strength was 4/5.    Sensory: Intact to fine touch, pinprick, preserved vibratory sensation, and proprioception at toes.  Coordination: Normal finger to nose, heel-to-shin bilaterally there was no truncal ataxia  Gait: Rising up from seated position pushing on chair arm, bilateral knee valgus, cautious, small steps.  Romberg signs: Negative  Deep tendon reflexes: Brachioradialis 2/2, biceps 2/2, triceps 2/2, patellar 2/2, Achilles 1/1, plantar responses were flexor bilaterally.  Assessment and plan:78  years old right-handed Caucasian female, with history of stroke, now presenting with complex partial seizure,Also complains of new onset headaches for 1 year   1 continue Keppra 500 mg twice a day 2. ESR C-reactive protein to rule out temporal arteritis 3. Tramadol needed 4. She has excessive daytime fatigue, sleepiness, narrow oropharyngeal, ESS score is 6, F  SS score is 56, will proceed with sleep study, rule out abstract if sleep apnea  Marcial Pacas, M.D. Ph.D.  [facility] [place], [place] [address] Phone: [phone] Fax:      [phone]

## 2014-07-15 LAB — SEDIMENTATION RATE: Sed Rate: 38 mm/hr (ref 0–40)

## 2014-07-15 LAB — C-REACTIVE PROTEIN: CRP: 11 mg/L — ABNORMAL HIGH (ref 0.0–4.9)

## 2014-07-15 LAB — TSH: TSH: 2.03 u[IU]/mL (ref 0.450–4.500)

## 2014-07-15 NOTE — Progress Notes (Signed)
Quick Note:  Called and spoke to patient's husband relayed mild elevated c protein normal ESR. Patient understood and he will tell his wife. ______

## 2014-07-20 ENCOUNTER — Telehealth: Payer: Self-pay | Admitting: Neurology

## 2014-07-20 DIAGNOSIS — G40209 Localization-related (focal) (partial) symptomatic epilepsy and epileptic syndromes with complex partial seizures, not intractable, without status epilepticus: Secondary | ICD-10-CM

## 2014-07-20 DIAGNOSIS — R51 Headache: Secondary | ICD-10-CM

## 2014-07-20 DIAGNOSIS — R519 Headache, unspecified: Secondary | ICD-10-CM

## 2014-07-20 DIAGNOSIS — I639 Cerebral infarction, unspecified: Secondary | ICD-10-CM

## 2014-07-20 DIAGNOSIS — R400 Somnolence: Secondary | ICD-10-CM

## 2014-07-20 NOTE — Telephone Encounter (Signed)
Dr. Marcial Pacas, refers patient for attended sleep study.  Height: 5'2"  Weight: 196 lb  BMI: 35.84  Past Medical History:  HTN, s/p CVA right basal ganglia 11/2001, dyslipidemia, DM, anxiety/depression, s/p appendectomy, s/p hysterectomy, DJD, s/p left hip replacement 03/2002, left knee replacement 01/2011, foot surgery and reverse left shoulder arthroplasty 11/2011.  Sleep Symptoms: She has excessive daytime fatigue, sleepiness   Epworth Score: ESS score is 6  Medication:  Albuterol Sulfate (Aero Soln) PROAIR HFA 108 (90 BASE) MCG/ACT as needed.       Aspirin (Chew Tab) aspirin 81 MG Chew 1 tablet (81 mg total) by mouth daily.      Atorvastatin Calcium (Tab) LIPITOR 40 MG Take 40 mg by mouth daily.      BusPIRone HCl (Tab) BUSPAR 15 MG Take 15 mg by mouth 3 (three) times daily.      Carvedilol (Tab) COREG 6.25 MG take 1 tablet by mouth twice a day with meals      Glimepiride (Tab) AMARYL 2 MG Take 2 mg by mouth daily before breakfast.      Glucose Blood (Strip) ONE TOUCH ULTRA TEST        LORazepam (Tab) ATIVAN 2 MG       LevETIRAcetam (Tab) KEPPRA 500 MG Take 1 tablet (500 mg total) by mouth 2 (two) times daily.      Lisinopril (Tab) PRINIVIL,ZESTRIL 20 MG       MetFORMIN HCl (Tab) GLUCOPHAGE 500 MG       MetFORMIN HCl (Tab) GLUCOPHAGE 1000 MG 1,000 mg daily.      Nitroglycerin (SL Tab) NITROSTAT 0.4 MG Place 1 tablet (0.4 mg total) under the tongue every 5 (five) minutes as needed for chest pain.      Omega-3 Fatty Acids (Cap) Fish Oil 1200 MG Take 2,400 mg by mouth 2 (two) times daily.      Sertraline HCl (Tab) ZOLOFT 50 MG Take 75 mg by mouth daily.       TraMADol HCl (Tab) ULTRAM 50 MG Take 1 tablet (50 mg total) by mouth every 6 (six) hours as needed.    Ins: [employer]   Assessment & Plan:  79 years old right-handed Caucasian female, with history of stroke, now presenting with complex partial seizure,Also complains of new onset  headaches for 1 year   1 continue Keppra 500 mg twice a day 2. ESR C-reactive protein to rule out temporal arteritis 3. Tramadol needed 4. She has excessive daytime fatigue, sleepiness, narrow oropharyngeal, ESS score is 6, F SS score is 56, will proceed with sleep study, rule out abstract if sleep apnea  Please review patient information and submit instructions for scheduling and orders for sleep technologist. Thank you.

## 2014-07-21 NOTE — Telephone Encounter (Signed)
Sleep study request review: This patient has an underlying medical history of right basal ganglia stroke, hyperlipidemia, diabetes, anxiety, depression, arthritis, hypertension, and complex partial seizures as well as obesity and is referred by Dr. [name] for an attended sleep study due to a report of recurrent headaches and excessive daytime somnolence. I will order a split-night sleep study and see the patient in sleep medicine consultation afterwards. Please print this note and attach to chart.   Technologist instructions: Please score at 4% and split if 2 hour estimated AHI >15/h.    Star Age, MD, PhD [facility])

## 2014-08-18 ENCOUNTER — Ambulatory Visit (INDEPENDENT_AMBULATORY_CARE_PROVIDER_SITE_OTHER): Payer: Medicare Other | Admitting: Neurology

## 2014-08-18 VITALS — BP 156/68 | HR 79 | Ht 62.0 in | Wt 194.0 lb

## 2014-08-18 DIAGNOSIS — R269 Unspecified abnormalities of gait and mobility: Secondary | ICD-10-CM | POA: Insufficient documentation

## 2014-08-18 DIAGNOSIS — R569 Unspecified convulsions: Secondary | ICD-10-CM

## 2014-08-18 DIAGNOSIS — R3915 Urgency of urination: Secondary | ICD-10-CM | POA: Insufficient documentation

## 2014-08-18 MED ORDER — SOLIFENACIN SUCCINATE 5 MG PO TABS: 5.0000 mg | ORAL_TABLET | Freq: Every day | ORAL | Status: DC

## 2014-08-18 NOTE — Progress Notes (Signed)
Mrs. Martha Olsen is a 79 years old right-handed Caucasian female, accompanied by her husband, follow-up for seizure, gait difficulty, headaches  I saw her initially following her hospital discharge in March 2013,  She has past medical history of HTN, s/p CVA right basal ganglia 11/2001, dyslipidemia, DM, anxiety/depression, s/p appendectomy, s/p hysterectomy, DJD, s/p left hip replacement 03/2002, left knee replacement 01/2011, foot surgery and reverse left shoulder arthroplasty 11/2011.  In October 13 2011, she was at home, sitting at the rocking chair, was noted by her family fell over a chair, had generalized tonic-clonic seizure, patient has no warning signs, no collection of the event,  EMS was called, she was taken by ambulance to the hospital, she could not remember waking up in the hospital confused, she was put on Keppra 500 mg twice a day, tolerating it well, there was no recurrent seizure. There was mild elevated troponin, she was seen by cardiologist, will continue followup  EEG was normal .MRI of the brain has demonstrated old basal ganglion, and coronal radiata stroke, no acute lesions,  She complains of headache everyday, right temporal regions, light noise is bothersome,   She has quit driving since [age], husband retired, she is active at home, house work  UPDATE July 18th 2014:  She is overall doing well, no recurrent seizure, tolerating medications, she has mild gait difficulty due to left shoulder, left hip replacement.  UPDATE Dec 29th 2015:  She has no recurrent seizures, tolerating Keppra without significant side effect, she has increased gait difficulty because of joints, knee pain, She has been complaining of a year history of worsening headaches, bilateral frontal, vortex region, she has to take ibuprofen 200 mg 2-3 tablets each time, sometimes two dosage each day  UPDATE Feb 2nd 2016: She was given prescription of tramadol 50 mg as needed since last visit December 2015, her  headache has much improved, taking tramadol about twice each week, she continue has bilateral knee pain, low back pain, gait difficulty, she also has nocturia, frequent awakening, loss snoring, excessive daytime sleepiness, fatigue, sleep studies pending in August 25 2014  ESR, TSH was normal December 2015, mild elevated C-reactive protein 11.   PHYSICAL EXAMINATOINS:  Generalized: In no acute distress  Neck: Supple, no carotid bruits   Cardiac: Regular rate rhythm  Pulmonary: Clear to auscultation bilaterally  Musculoskeletal: No deformity  Neurological examination  Mentation: Alert oriented to time, place, history taking, and causual conversation  Cranial nerve II-XII: Pupils were equal round reactive to light extraocular movements were full, visual field were full on confrontational test. facial sensation and strength were normal. hearing was intact to finger rubbing bilaterally. Uvula tongue midline.  head turning and shoulder shrug and were normal and symmetric.Tongue protrusion into cheek strength was normal. She has narrow oropharyngeal   Motor:  mild limitation on left shoulder due to pain, left arm pronation, proximal, distal left arm strength was 4/5.    Sensory: Intact to fine touch, pinprick, preserved vibratory sensation, and proprioception at toes.  Coordination: Normal finger to nose, heel-to-shin bilaterally there was no truncal ataxia  Gait: Rising up from seated position pushing on chair arm, bilateral knee valgus, cautious, small steps.  Romberg signs: Negative  Deep tendon reflexes: Brachioradialis 2/2, biceps 2/2, triceps 2/2, patellar 2/2, Achilles 1/1, plantar responses were flexor bilaterally.  Assessment and plan:79  years old right-handed Caucasian female, with history of stroke,complex partial seizure, worsening headaches, no evidence of temporal arteritis, chronic gait difficulty. 1 continue Keppra 500 mg twice  a day 2. Tramadol needed for  headaches 3.  Sleep study for symptoms suggestive of obstructive sleep apnea 4. Her gait difficulty are likely combination of obesity, deconditioning, joints pain, she does has hyperreflexia, chronic low back pain, could not rule out possibility of lumbar radiculopathy, with superimposed cervical spondylitic myelopathy, will not initiate any evaluation at this point, she does not want physical therapy either.   No orders of the defined types were placed in this encounter.    Modified Medications   No medications on file    Return in about 6 months (around 02/16/2015).    , M.D. Ph.D.  [facility] [address] [place], [place] [address] Phone: [phone] Fax:      [phone]              

## 2014-08-25 ENCOUNTER — Ambulatory Visit (INDEPENDENT_AMBULATORY_CARE_PROVIDER_SITE_OTHER): Payer: Medicare Other | Admitting: Neurology

## 2014-08-25 DIAGNOSIS — G4761 Periodic limb movement disorder: Secondary | ICD-10-CM

## 2014-08-25 DIAGNOSIS — G4710 Hypersomnia, unspecified: Secondary | ICD-10-CM

## 2014-08-25 DIAGNOSIS — G479 Sleep disorder, unspecified: Secondary | ICD-10-CM

## 2014-08-25 DIAGNOSIS — G4733 Obstructive sleep apnea (adult) (pediatric): Secondary | ICD-10-CM

## 2014-08-25 DIAGNOSIS — G4720 Circadian rhythm sleep disorder, unspecified type: Secondary | ICD-10-CM

## 2014-08-25 NOTE — Sleep Study (Signed)
Please see the scanned sleep study interpretation located in the Procedure tab within the Chart Review section. 

## 2014-08-28 ENCOUNTER — Telehealth: Payer: Self-pay | Admitting: Neurology

## 2014-08-28 NOTE — Telephone Encounter (Signed)
Please call and notify the patient that the recent sleep study did show evidence of obstructive sleep apnea. Please inform patient that I would like to go over the details of the study during a new pt appointment. Pls arrange appointment. Also, route or fax report to PCP and referring MD, if other than PCP.  Once you have spoken to patient, you can close this encounter.   Thanks,  Star Age, MD, PhD [facility])

## 2014-09-01 ENCOUNTER — Encounter: Payer: Self-pay | Admitting: Neurology

## 2014-09-01 NOTE — Telephone Encounter (Signed)
Patient contacted and instructed that Dr. Rexene Alberts wanted to meet with her to go over sleep study results.  Patient agreed and scheduled her appointment for 09/03/14 at 08:30 pm.  Dr. Rexene Alberts was routed a copy of the results.

## 2014-09-03 ENCOUNTER — Encounter: Payer: Self-pay | Admitting: Neurology

## 2014-09-03 ENCOUNTER — Ambulatory Visit (INDEPENDENT_AMBULATORY_CARE_PROVIDER_SITE_OTHER): Payer: Medicare Other | Admitting: Neurology

## 2014-09-03 VITALS — BP 160/68 | HR 92 | Resp 18 | Ht 63.0 in | Wt 197.0 lb

## 2014-09-03 DIAGNOSIS — R269 Unspecified abnormalities of gait and mobility: Secondary | ICD-10-CM

## 2014-09-03 DIAGNOSIS — I639 Cerebral infarction, unspecified: Secondary | ICD-10-CM | POA: Diagnosis not present

## 2014-09-03 DIAGNOSIS — G40909 Epilepsy, unspecified, not intractable, without status epilepticus: Secondary | ICD-10-CM | POA: Diagnosis not present

## 2014-09-03 DIAGNOSIS — G4730 Sleep apnea, unspecified: Secondary | ICD-10-CM | POA: Diagnosis not present

## 2014-09-03 DIAGNOSIS — G4719 Other hypersomnia: Secondary | ICD-10-CM

## 2014-09-03 DIAGNOSIS — I10 Essential (primary) hypertension: Secondary | ICD-10-CM

## 2014-09-03 MED ORDER — GABAPENTIN 300 MG PO CAPS: 300.0000 mg | ORAL_CAPSULE | Freq: Every day | ORAL | Status: DC

## 2014-09-03 NOTE — Progress Notes (Signed)
[facility]  PATIENT: Martha Olsen DOB: [birth year]  REFERRING DOCTOR OR PCP:  Donnie Coffin SOURCE: Patient and husband  _________________________________   HISTORICAL  CHIEF COMPLAINT:  Chief Complaint  Patient presents with  . Sleep Apnea    To review sleep study results.  She sts. study was ordered due to difficulty going to and staying asleep.  Sts. she feels drowsy during the day and often takes a 3min. nap mid-afternoon/fim  . Insomnia    HISTORY OF PRESENT ILLNESS:  Martha Olsen is a 79 year old woman with a h/o a recent seizure , essential hypertension and CVA who underwent a sleep study for insomnia, snoring and excessive daytime tenderness.   She sees Dr. [name] for the stroke and seizure.    Her CVA was 2003 and her right side was numb and clumsy more than weak.    The seizure was more recent (march 2013) and was not associated with any preceding event.    Martha Olsen goes to bed around 8 PM and watches TV. Around 9 PM she tries to fall asleep. She was waking up about 5 times a night to urinate and would often have trouble falling back asleep. She was recently started on Vesicare and now only has nocturia twice most nights. However she still has difficulty falling back asleep. She wakes up to get out of bed around 7:30 in the morning. She is still tired upon awakening. During the day, she sometimes struggles to stay awake and often takes a 30 minute nap in the mid afternoon.  She had a PSG on 08/25/2014 that was reviewed. It was abnormal showing mild sleep apnea with an AHI equals 13.9 more pronounced when she was supine or on her left. However, the REM associated AHI was 73.8 (severe REM associated OSA). She also had mild periodic limb movements of sleep with an index of 5.1. Her sleep efficiency was markedly reduced at 46% with moderate sleep fragmentation noted.   EPWORTH SLEEPINESS SCALE  On a scale of 0 - 3 what is the chance of dozing:  Sitting and  Reading:   2 Watching TV:    2 Sitting inactive in a public place: 2 Passenger in car for one hour: 3 Lying down to rest in the afternoon: 3 Sitting and talking to someone: 1 Sitting quietly after lunch:  2 In a car, stopped in traffic:  0  Total (out of 24):   15/24 (moderate EDS)   REVIEW OF SYSTEMS: Constitutional: No fevers, chills, sweats, or change in appetite Eyes: No visual changes, double vision, eye pain Ear, nose and throat: No hearing loss, ear pain, nasal congestion, sore throat Cardiovascular: No chest pain, palpitations Respiratory: No shortness of breath at rest or with exertion.   No wheezes GastrointestinaI: No nausea, vomiting, abdominal pain, fecal incontinence.  Notes diarrhea and occ incontinence Genitourinary: Notes urinary frequency, frequent nocturia and occasional incontinence.. Musculoskeletal: No neck pain, back pain Integumentary: No rash, pruritus, skin lesions Neurological:  She had a seizure recently. She occasionally gets headaches. She has excessive daytime sleepiness and restless legs. Psychiatric: Notes depression and mild anxiety Endocrine: No palpitations, diaphoresis, change in appetite, change in weigh or increased thirst Hematologic/Lymphatic: No anemia, purpura, petechiae. Allergic/Immunologic: She notes seasonal allergies. She has no rashes.  ALLERGIES: Allergies  Allergen Reactions  . Codeine Hives    HOME MEDICATIONS:  Current outpatient prescriptions:  .  aspirin 81 MG chewable tablet, Chew 1 tablet (81 mg total) by mouth daily., Disp:  30 tablet, Rfl: 2 .  atorvastatin (LIPITOR) 40 MG tablet, Take 40 mg by mouth daily., Disp: , Rfl:  .  busPIRone (BUSPAR) 15 MG tablet, Take 15 mg by mouth 3 (three) times daily., Disp: , Rfl:  .  carvedilol (COREG) 6.25 MG tablet, take 1 tablet by mouth twice a day with meals, Disp: 60 tablet, Rfl: 12 .  glimepiride (AMARYL) 2 MG tablet, Take 2 mg by mouth daily before breakfast., Disp: , Rfl:    .  levETIRAcetam (KEPPRA) 500 MG tablet, Take 1 tablet (500 mg total) by mouth 2 (two) times daily., Disp: 60 tablet, Rfl: 11 .  lisinopril (PRINIVIL,ZESTRIL) 20 MG tablet, , Disp: , Rfl:  .  LORazepam (ATIVAN) 2 MG tablet, , Disp: , Rfl:  .  metFORMIN (GLUCOPHAGE) 1000 MG tablet, 1,000 mg daily., Disp: , Rfl: 1 .  nitroGLYCERIN (NITROSTAT) 0.4 MG SL tablet, Place 1 tablet (0.4 mg total) under the tongue every 5 (five) minutes as needed for chest pain., Disp: 25 tablet, Rfl: 3 .  Omega-3 Fatty Acids (FISH OIL) 1200 MG CAPS, Take 2,400 mg by mouth 2 (two) times daily., Disp: , Rfl:  .  ONE TOUCH ULTRA TEST test strip, , Disp: , Rfl: 0 .  PROAIR HFA 108 (90 BASE) MCG/ACT inhaler, as needed., Disp: , Rfl:  .  sertraline (ZOLOFT) 50 MG tablet, Take 75 mg by mouth daily. , Disp: , Rfl:  .  solifenacin (VESICARE) 5 MG tablet, Take 1 tablet (5 mg total) by mouth at bedtime., Disp: 30 tablet, Rfl: 6 .  traMADol (ULTRAM) 50 MG tablet, Take 1 tablet (50 mg total) by mouth every 6 (six) hours as needed., Disp: 30 tablet, Rfl: 3  PAST MEDICAL HISTORY: Past Medical History  Diagnosis Date  . Stroke 2002    right side weakness  . Hypertension   . Diabetes mellitus 2011  . Headache(784.0)   . Anxiety   . Depression   . Seizure     PAST SURGICAL HISTORY: Past Surgical History  Procedure Laterality Date  . Foot surgery    . Cholecystectomy  1993  . Tonsillectomy  1951  . Eye surgery  2009  . Abdominal hysterectomy  1980's  . Appendectomy  1980's  . Joint replacement  2012    Hip, Knee  . Reverse shoulder arthroplasty  12/07/2011    Procedure: REVERSE SHOULDER ARTHROPLASTY;  Surgeon: Marin Shutter, MD;  Location: [place];  Service: Orthopedics;  Laterality: Left;  left total reverse shoulder    FAMILY HISTORY: Family History  Problem Relation Age of Onset  . Anesthesia problems Neg Hx   . CAD Father 29  . Stroke Mother 87    SOCIAL HISTORY:  History   Social History  . Marital  Status: Married    Spouse Name: Denyse Amass  . Number of Children: 4  . Years of Education: 9th   Occupational History  . Chartered certified accountant     Retired   Social History Main Topics  . Smoking status: Never Smoker   . Smokeless tobacco: Never Used  . Alcohol Use: No  . Drug Use: No  . Sexual Activity: Not on file   Other Topics Concern  . Not on file   Social History Narrative   Son recently died of cancer.  Lives at home with husband Thomes Cake) .  She has 3 children .    Education 10 th grade.   Right handed.  PHYSICAL EXAM  Filed Vitals:   09/03/14 0837  BP: 160/68  Pulse: 92  Resp: 18  Height: 5\' 3"  (1.6 m)  Weight: 197 lb (89.359 kg)    Body mass index is 34.91 kg/(m^2).   General: The patient is well-developed and well-nourished and in no acute distress  Eyes:  Funduscopic exam shows normal optic discs and retinal vessels.  Neck: The neck is supple, no carotid bruits are noted.  The neck is nontender.  Cardiovascular: The heart has a regular rate and rhythm with a normal S1 and S2. There were no murmurs, gallops or rubs. Lungs are clear to auscultation.  Skin: Extremities are without significant edema.    Neurologic Exam  Mental status: The patient is alert and oriented x 3 at the time of the examination. The patient has apparent normal recent and remote memory, with an apparently normal attention span and concentration ability.   Speech is normal.  Cranial nerves: Extraocular movements are full. Pupils are equal, round, and reactive to light and accomodation.  Visual fields are full.  Facial symmetry is present. There is good facial sensation to soft touch bilaterally.Facial strength is normal.  Trapezius and sternocleidomastoid strength is normal. No dysarthria is noted.  The tongue is midline, and the patient has symmetric elevation of the soft palate. No obvious hearing deficits are noted.  Motor:  Muscle bulk is normal.   Tone is normal. Strength is  5  / 5 in all 4 extremities.   Sensory: Sensory testing shows reduced touch sensation in the right leg but more symmetric vibration sensation. There is only minimal reduced touch sensation in the right arm.  Coordination: Cerebellar testing reveals reduced right finger-nose-finger and poor bilateral heel-to-shin   Gait and station: Station is normal.   Gait is wide stanced and arthritic.   She cannot tandem walk Romberg is negative.   Reflexes: Deep tendon reflexes are symmetric and 1+ bilaterally.   Plantar responses are flexor.    DIAGNOSTIC DATA (LABS, IMAGING, TESTING) - I reviewed patient records, labs, notes, testing and imaging myself where available.  Lab Results  Component Value Date   WBC 10.1 11/14/2012   HGB 13.4 11/14/2012   HCT 39.8 11/14/2012   MCV 91.1 11/14/2012   PLT 224.0 11/14/2012      Component Value Date/Time   NA 136 11/14/2012 1120   K 4.0 11/14/2012 1120   CL 104 11/14/2012 1120   CO2 26 11/14/2012 1120   GLUCOSE 89 11/14/2012 1120   BUN 19 11/14/2012 1120   CREATININE 0.6 11/14/2012 1120   CALCIUM 9.2 11/14/2012 1120   PROT 6.4 10/13/2012 0500   ALBUMIN 3.1* 10/13/2012 0500   AST 38* 10/13/2012 0500   ALT 20 10/13/2012 0500   ALKPHOS 85 10/13/2012 0500   BILITOT 0.6 10/13/2012 0500   GFRNONAA 85* 10/14/2012 0110   GFRAA >90 10/14/2012 0110   Lab Results  Component Value Date   CHOL 117 10/14/2012   HDL 42 10/14/2012   LDLCALC 40 10/14/2012   TRIG 175* 10/14/2012   CHOLHDL 2.8 10/14/2012   Lab Results  Component Value Date   HGBA1C 7.0* 10/12/2012   No results found for: VITAMINB12 Lab Results  Component Value Date   TSH 2.030 07/14/2014       ASSESSMENT AND PLAN  Sleep apnea - Plan: Cpap titration  Stroke - Plan: Cpap titration  Seizure disorder  Abnormality of gait  Essential hypertension - Plan: Cpap titration  Excessive daytime sleepiness  In summary, Martha Olsen is a 79 year old woman with a history of a  stroke and hypertension who underwent a sleep study for snoring, excessive daytime sleepiness and insomnia. To have mild overall OSA but severe REM associated OSA. She has moderate excessive daytime sleepiness noted on her Epworth Sleepiness Scale. Due to the combination of her excessive daytime sleepiness and her h/o stroke and hypertension, it is recommended that she treat the sleep apnea. She has false teeth and cannot use an oral appliance. CPAP would be the best option for her and I set up titration study. She will return to see me afterwards so we can set up CPAP at home.  She will call sooner if she has any sleep related issues.  Christopher Hink A. Felecia Shelling, MD, PhD 9/78/4784, 1:28 AM Certified in Neurology, Clinical Neurophysiology, Sleep Medicine, Pain Medicine and Neuroimaging  [facility] [address] [place], [place] [address] [phone]

## 2014-10-02 ENCOUNTER — Telehealth: Payer: Self-pay | Admitting: Neurology

## 2014-10-02 ENCOUNTER — Ambulatory Visit (INDEPENDENT_AMBULATORY_CARE_PROVIDER_SITE_OTHER): Payer: Medicare Other | Admitting: Neurology

## 2014-10-02 VITALS — BP 141/73 | HR 93

## 2014-10-02 DIAGNOSIS — G4730 Sleep apnea, unspecified: Secondary | ICD-10-CM

## 2014-10-02 DIAGNOSIS — G4733 Obstructive sleep apnea (adult) (pediatric): Secondary | ICD-10-CM

## 2014-10-02 DIAGNOSIS — G4720 Circadian rhythm sleep disorder, unspecified type: Secondary | ICD-10-CM

## 2014-10-02 DIAGNOSIS — G479 Sleep disorder, unspecified: Secondary | ICD-10-CM

## 2014-10-02 DIAGNOSIS — G4761 Periodic limb movement disorder: Secondary | ICD-10-CM

## 2014-10-02 NOTE — Telephone Encounter (Signed)
Called and confirmed appointment for tonight in sleep lab.

## 2014-10-03 NOTE — Sleep Study (Signed)
Please see the scanned sleep study interpretation located in the Procedure tab within the Chart Review section. 

## 2014-10-21 ENCOUNTER — Telehealth: Payer: Self-pay | Admitting: Neurology

## 2014-10-21 DIAGNOSIS — G4733 Obstructive sleep apnea (adult) (pediatric): Secondary | ICD-10-CM

## 2014-10-21 NOTE — Telephone Encounter (Signed)
Please call and inform patient that I have entered an order for treatment with PAP. She did fairly well during the latest sleep study with CPAP. We will, therefore, arrange for a machine for home use through a DME (durable medical equipment) company of Her choice; and I will see the patient back in follow-up in about 6 weeks. Please also explain to the patient that I will be looking out for compliance data downloaded from the machine, which can be done remotely through a modem at times or stored on an SD card in the back of the machine. At the time of the followup appointment we will discuss sleep study results and how it is going with PAP treatment at home. Please advise patient to bring Her machine at the time of the visit; at least for the first visit, even though this is cumbersome. Bringing the machine for every visit after that may not be needed, but often helps for the first visit. Please also make sure, the patient has a follow-up appointment with me in about 6 weeks from the setup date, thanks.   Star Age, MD, PhD [facility])

## 2014-10-22 ENCOUNTER — Encounter: Payer: Self-pay | Admitting: Neurology

## 2014-10-23 NOTE — Telephone Encounter (Signed)
I spoke with Martha Olsen. She is aware of her Sleep Study results and would like to start CPAP. We also made a follow up visit to see Dr. Rexene Alberts.

## 2014-10-26 NOTE — Telephone Encounter (Signed)
I have sent a referral for this patient to be set up on CPAP to [facility].  Dr. [name] had already been sent results.

## 2014-11-03 ENCOUNTER — Ambulatory Visit: Payer: Medicare Other | Admitting: Neurology

## 2014-11-08 ENCOUNTER — Encounter (HOSPITAL_COMMUNITY): Payer: Self-pay | Admitting: Nurse Practitioner

## 2014-11-08 ENCOUNTER — Emergency Department (HOSPITAL_COMMUNITY)
Admission: EM | Admit: 2014-11-08 | Discharge: 2014-11-08 | Disposition: A | Discharge status: Home / Self Care | Payer: Medicare Other | Attending: Emergency Medicine | Admitting: Emergency Medicine

## 2014-11-08 ENCOUNTER — Emergency Department (HOSPITAL_COMMUNITY): Payer: Medicare Other

## 2014-11-08 DIAGNOSIS — F329 Major depressive disorder, single episode, unspecified: Secondary | ICD-10-CM | POA: Diagnosis not present

## 2014-11-08 DIAGNOSIS — M25561 Pain in right knee: Secondary | ICD-10-CM

## 2014-11-08 DIAGNOSIS — F419 Anxiety disorder, unspecified: Secondary | ICD-10-CM | POA: Insufficient documentation

## 2014-11-08 DIAGNOSIS — S8992XA Unspecified injury of left lower leg, initial encounter: Secondary | ICD-10-CM | POA: Insufficient documentation

## 2014-11-08 DIAGNOSIS — Z79899 Other long term (current) drug therapy: Secondary | ICD-10-CM | POA: Diagnosis not present

## 2014-11-08 DIAGNOSIS — Y9389 Activity, other specified: Secondary | ICD-10-CM | POA: Insufficient documentation

## 2014-11-08 DIAGNOSIS — W1839XA Other fall on same level, initial encounter: Secondary | ICD-10-CM | POA: Insufficient documentation

## 2014-11-08 DIAGNOSIS — Y998 Other external cause status: Secondary | ICD-10-CM | POA: Insufficient documentation

## 2014-11-08 DIAGNOSIS — Z7982 Long term (current) use of aspirin: Secondary | ICD-10-CM | POA: Diagnosis not present

## 2014-11-08 DIAGNOSIS — Z8673 Personal history of transient ischemic attack (TIA), and cerebral infarction without residual deficits: Secondary | ICD-10-CM | POA: Diagnosis not present

## 2014-11-08 DIAGNOSIS — Y9289 Other specified places as the place of occurrence of the external cause: Secondary | ICD-10-CM | POA: Diagnosis not present

## 2014-11-08 DIAGNOSIS — G8929 Other chronic pain: Secondary | ICD-10-CM | POA: Insufficient documentation

## 2014-11-08 DIAGNOSIS — G40909 Epilepsy, unspecified, not intractable, without status epilepticus: Secondary | ICD-10-CM | POA: Insufficient documentation

## 2014-11-08 DIAGNOSIS — S8991XA Unspecified injury of right lower leg, initial encounter: Secondary | ICD-10-CM | POA: Diagnosis not present

## 2014-11-08 DIAGNOSIS — I10 Essential (primary) hypertension: Secondary | ICD-10-CM | POA: Diagnosis not present

## 2014-11-08 DIAGNOSIS — M25562 Pain in left knee: Secondary | ICD-10-CM

## 2014-11-08 DIAGNOSIS — E119 Type 2 diabetes mellitus without complications: Secondary | ICD-10-CM | POA: Insufficient documentation

## 2014-11-08 NOTE — Discharge Instructions (Signed)

## 2014-11-08 NOTE — ED Notes (Signed)
She reports she fell twice on Friday, states she falls often due to her neuropathy. She began to have bilateral knee pain and can feel knots on them so would like to be checked out. Denies LOC, head injury. She is A&Ox4.

## 2014-11-08 NOTE — ED Provider Notes (Signed)
CSN: [id number]     Arrival date & time 11/08/14  1240 History   First MD Initiated Contact with Patient 11/08/14 1507     Chief Complaint  Patient presents with  . Fall     (Consider location/radiation/quality/duration/timing/severity/associated sxs/prior Treatment) HPI   This is a 79 year old female, with a history of CVA, hypertension, diabetes, anxiety, depression, seizure disorder, presents today with knee pain. Onset weeks ago, located bilateral knees this is intermittent, sharp, throbbing. Alleviated with home pain medications. Nonradiating. Negative for weakness, numbness, tingling. It is worsened each time she falls. She has chronic issues with falls due to diabetic neuropathy, does not use the walker that's been provided to her by her primary care physician. At no point has she experienced LOC, amnesia, blood loss, new focal weakness, numbness, or tingling. Negative for chest pain, shortness of breath, headache. Family does report worsening confusion over the last few years, which is currently being worked up by primary care physician.  Past Medical History  Diagnosis Date  . Stroke 2002    right side weakness  . Hypertension   . Diabetes mellitus 2011  . Headache(784.0)   . Anxiety   . Depression   . Seizure    Past Surgical History  Procedure Laterality Date  . Foot surgery    . Cholecystectomy  1993  . Tonsillectomy  1951  . Eye surgery  2009  . Abdominal hysterectomy  1980's  . Appendectomy  1980's  . Joint replacement  2012    Hip, Knee  . Reverse shoulder arthroplasty  12/07/2011    Procedure: REVERSE SHOULDER ARTHROPLASTY;  Surgeon: Marin Shutter, MD;  Location: [place];  Service: Orthopedics;  Laterality: Left;  left total reverse shoulder   Family History  Problem Relation Age of Onset  . Anesthesia problems Neg Hx   . CAD Father 77  . Stroke Mother 74   History  Substance Use Topics  . Smoking status: Never Smoker   . Smokeless tobacco: Never Used  .  Alcohol Use: No   OB History    No data available     Review of Systems  Constitutional: Negative for fever and chills.  HENT: Negative for facial swelling.   Eyes: Negative for photophobia and pain.  Respiratory: Negative for cough and shortness of breath.   Cardiovascular: Negative for chest pain and leg swelling.  Gastrointestinal: Negative for nausea, vomiting and abdominal pain.  Genitourinary: Negative for dysuria.  Musculoskeletal: Positive for arthralgias.  Skin: Negative for rash and wound.  Neurological: Negative for seizures.  Hematological: Negative for adenopathy.      Allergies  Codeine  Home Medications   Prior to Admission medications   Medication Sig Start Date End Date Taking? Authorizing Provider  aspirin 81 MG chewable tablet Chew 1 tablet (81 mg total) by mouth daily. 10/14/12   Barton Dubois, MD  atorvastatin (LIPITOR) 40 MG tablet Take 40 mg by mouth daily.    Historical Provider, MD  busPIRone (BUSPAR) 15 MG tablet Take 15 mg by mouth 3 (three) times daily.    Historical Provider, MD  carvedilol (COREG) 6.25 MG tablet take 1 tablet by mouth twice a day with meals    Marcial Pacas, MD  gabapentin (NEURONTIN) 300 MG capsule Take 1 capsule (300 mg total) by mouth at bedtime. 09/03/14   Britt Bottom, MD  glimepiride (AMARYL) 2 MG tablet Take 2 mg by mouth daily before breakfast.    Historical Provider, MD  levETIRAcetam (KEPPRA) 500  MG tablet Take 1 tablet (500 mg total) by mouth 2 (two) times daily. 07/14/14   Marcial Pacas, MD  lisinopril (PRINIVIL,ZESTRIL) 20 MG tablet  10/05/12   Historical Provider, MD  LORazepam (ATIVAN) 2 MG tablet  12/02/12   Historical Provider, MD  metFORMIN (GLUCOPHAGE) 1000 MG tablet 1,000 mg daily. 07/01/14   Historical Provider, MD  nitroGLYCERIN (NITROSTAT) 0.4 MG SL tablet Place 1 tablet (0.4 mg total) under the tongue every 5 (five) minutes as needed for chest pain. 12/11/12   Josue Hector, MD  Omega-3 Fatty Acids (FISH OIL) 1200  MG CAPS Take 2,400 mg by mouth 2 (two) times daily.    Historical Provider, MD  ONE TOUCH ULTRA TEST test strip  05/02/14   Historical Provider, MD  PROAIR HFA 108 (90 BASE) MCG/ACT inhaler as needed. 10/04/12   Historical Provider, MD  sertraline (ZOLOFT) 50 MG tablet Take 75 mg by mouth daily.     Historical Provider, MD  solifenacin (VESICARE) 5 MG tablet Take 1 tablet (5 mg total) by mouth at bedtime. 08/18/14   Marcial Pacas, MD  traMADol (ULTRAM) 50 MG tablet Take 1 tablet (50 mg total) by mouth every 6 (six) hours as needed. 07/14/14   Marcial Pacas, MD   BP 160/67 mmHg  Pulse 77  Temp(Src) 98 F (36.7 C)  Resp 20  Ht 5\' 4"  (1.626 m)  Wt 200 lb (90.719 kg)  BMI 34.31 kg/m2  SpO2 98% Physical Exam  Constitutional: She is oriented to person, place, and time. She appears well-developed and well-nourished. No distress.  HENT:  Head: Normocephalic and atraumatic.  Mouth/Throat: No oropharyngeal exudate.  Eyes: Conjunctivae are normal. Pupils are equal, round, and reactive to light. No scleral icterus.  Neck: Normal range of motion. No tracheal deviation present. No thyromegaly present.  Cardiovascular: Normal rate, regular rhythm and normal heart sounds.  Exam reveals no gallop and no friction rub.   No murmur heard. Pulmonary/Chest: Effort normal and breath sounds normal. No stridor. No respiratory distress. She has no wheezes. She has no rales. She exhibits no tenderness.  Abdominal: Soft. She exhibits no distension and no mass. There is no tenderness. There is no rebound and no guarding.  Musculoskeletal: Normal range of motion. She exhibits no edema.       Right knee: She exhibits normal range of motion, no swelling, no effusion, no laceration, no erythema and normal alignment. Tenderness (Mild) found.       Left knee: She exhibits normal range of motion, no swelling, no effusion and no erythema. Tenderness found. Patellar tendon tenderness: mild.  Evidence of left knee surgery.   Neurological: She is alert and oriented to person, place, and time.  Skin: Skin is warm and dry. She is not diaphoretic.    ED Course  Procedures (including critical care time) Labs Review Labs Reviewed - No data to display  Imaging Review Dg Knee Complete 4 Views Left  11/08/2014   CLINICAL DATA:  Right knee pain after falling twice onto her knees 2 days ago.  EXAM: LEFT KNEE - COMPLETE 4+ VIEW  COMPARISON:  None.  FINDINGS: Left total knee prosthesis in satisfactory position and alignment. No fracture, dislocation or effusion seen. No periprosthetic lucency.  IMPRESSION: No acute abnormality.  Left total knee prosthesis.   Electronically Signed   By: Claudie Revering M.D.   On: 11/08/2014 14:40   Dg Knee Complete 4 Views Right  11/08/2014   CLINICAL DATA:  Fall, right knee pain  laterally  EXAM: RIGHT KNEE - COMPLETE 4+ VIEW  COMPARISON:  None.  FINDINGS: There is no evidence of fracture, dislocation, or joint effusion. There is no evidence of arthropathy or other focal bone abnormality. Soft tissues are unremarkable. Tricompartmental degenerative change noted. Patella tracks appropriately on sunrise view.  IMPRESSION: Negative.   Electronically Signed   By: Conchita Paris M.D.   On: 11/08/2014 14:41   MDM   Final diagnoses:  Chronic knee pain, right  Chronic knee pain, left    This is a 79 year old female, with a history of CVA, hypertension, diabetes, anxiety, depression, seizure disorder, presents today with knee pain. Onset weeks ago, located bilateral knees this is intermittent, sharp, throbbing. Alleviated with home pain medications. Nonradiating. Negative for weakness, numbness, tingling. It is worsened each time she falls. She has chronic issues with falls due to diabetic neuropathy, does not use the walker that's been provided to her by her primary care physician. At no point has she experienced LOC, amnesia, blood loss, new focal weakness, numbness, or tingling. Negative for chest  pain, shortness of breath, headache. Family does report worsening confusion over the last few years, which is currently being worked up by primary care physician.  The patient states that she is still in contact with the orthopedic surgeon that repaired her left knee. She states that the knee pain is been worse for a few days since her last fall, which prompted her presentation here today. On my examination, patient has no focal neurologic deficits. The knees bilaterally are mildly tender to palpation, without swelling, effusion, erythema. Patient has full range of motion in both lower extremities. She is neurovascularly intact in bilateral lower extremities. She ambulates without complication.Pt stable for discharge, FU with PCP.  All questions answered.  Return precautions given.  I have discussed case and care has been guided by my attending physician, Dr. Vanita Panda.  Doy Hutching, MD 11/08/14 [place], MD 11/08/14 [phone]

## 2014-11-16 ENCOUNTER — Other Ambulatory Visit: Payer: Self-pay | Admitting: Neurology

## 2014-11-17 ENCOUNTER — Other Ambulatory Visit: Payer: Self-pay | Admitting: Neurology

## 2014-11-17 MED ORDER — CARVEDILOL 6.25 MG PO TABS: 6.2500 mg | ORAL_TABLET | Freq: Two times a day (BID) | ORAL | Status: DC

## 2014-11-17 NOTE — Telephone Encounter (Signed)
I called back.  Patient would like to know if Dr [name] will auth refills on carvedilol.  Patient is not getting this from PCP or Cardiologist.  Message has been sent to provider for review.

## 2014-11-17 NOTE — Telephone Encounter (Signed)
Patients husband called and would like to know why her Rx. carvedilol (COREG) 6.25 MG tablet was denied. Please call and advise.

## 2014-12-01 NOTE — Progress Notes (Signed)
Cardiology Office Note   Date:  12/02/2014   ID:  Martha Olsen, DOB 05-14-1936, MRN [id number]  PCP:  Donnie Coffin, MD  Cardiologist:  Dr. Jenkins Rouge     Chief Complaint  Patient presents with  . Surgical Clearance  . Coronary Artery Disease     History of Present Illness: Martha Olsen is a 79 y.o. female with a hx of HTN, prior stroke in 2003, HL, diabetes, anxiety/depression, arthritis status post left hip replacement and left knee replacement as well as shoulder surgery, seizure disorder, sleep apnea. She was seen in 2014 with elevated troponin in the setting of pneumonia. Echocardiogram demonstrated EF 40-45%. Myoview was arranged that the patient had marked EKG changes. She was sent to the hospital and underwent cardiac catheterization 11/14/12 that demonstrated no significant CAD except for an ostial lesion in a tiny intermediate branch. Medical therapy was recommended. Last seen by Dr. Johnsie Cancel 11/2012.  She returns today for preoperative cardiovascular evaluation.  She needs a R TKR with Dr. Kathryne Hitch.  She is sedentary.  She does not do any formal exercise or therapy.  She does not do household chores.  She denies chest pain. She has dyspnea with activities.  She has noted DOE with ~ 2 years without change.  She remains somewhat weak on the L side.  She has balance issues since her CVA.  She denies orthopnea, PND, edema.  She denies syncope.    Studies/Reports Reviewed Today:  Echo 10/13/12 - EF 40% to 45%. There is moderate hypokinesis of the distalapical myocardium. - Atrial septum: No defect or patent foramen ovale was identified.  LHC 11/14/12 Left mainstem:   no disease Left anterior descending (LAD):   without disease Left circumflex (LCx): Tiny intermediate branch with ostial narrowing Right coronary artery (RCA):   No significant angiographic disease.  Left ventriculography: LVEF  55-65%  Final Conclusions:   1. Widely patent coronary arteries with  exception of an ostial lesion in a tiny intermediate branch 2. Normal LV function   Past Medical History  Diagnosis Date  . Stroke 2002    right side weakness  . Hypertension   . Diabetes mellitus 2011  . Headache(784.0)   . Anxiety   . Depression   . Seizure     Past Surgical History  Procedure Laterality Date  . Foot surgery    . Cholecystectomy  1993  . Tonsillectomy  1951  . Eye surgery  2009  . Abdominal hysterectomy  1980's  . Appendectomy  1980's  . Joint replacement  2012    Hip, Knee  . Reverse shoulder arthroplasty  12/07/2011    Procedure: REVERSE SHOULDER ARTHROPLASTY;  Surgeon: Marin Shutter, MD;  Location: [facility];  Service: Orthopedics;  Laterality: Left;  left total reverse shoulder     Current Outpatient Prescriptions  Medication Sig Dispense Refill  . aspirin 81 MG chewable tablet Chew 1 tablet (81 mg total) by mouth daily. 30 tablet 2  . atorvastatin (LIPITOR) 40 MG tablet Take 40 mg by mouth daily.    . busPIRone (BUSPAR) 15 MG tablet Take 15 mg by mouth 3 (three) times daily.    . carvedilol (COREG) 6.25 MG tablet Take 1 tablet (6.25 mg total) by mouth 2 (two) times daily with a meal. 60 tablet 12  . gabapentin (NEURONTIN) 300 MG capsule Take 1 capsule (300 mg total) by mouth at bedtime. 30 capsule 5  . glimepiride (AMARYL) 2 MG tablet Take 2 mg  by mouth daily before breakfast.    . levETIRAcetam (KEPPRA) 500 MG tablet Take 1 tablet (500 mg total) by mouth 2 (two) times daily. 60 tablet 11  . lisinopril (PRINIVIL,ZESTRIL) 20 MG tablet Take 20 mg by mouth daily.     . metFORMIN (GLUCOPHAGE) 1000 MG tablet 1,000 mg daily.  1  . nitroGLYCERIN (NITROSTAT) 0.4 MG SL tablet Place 1 tablet (0.4 mg total) under the tongue every 5 (five) minutes as needed for chest pain. 25 tablet 3  . Omega-3 Fatty Acids (FISH OIL) 1200 MG CAPS Take 2,400 mg by mouth 2 (two) times daily.    . ONE TOUCH ULTRA TEST test strip 1 each by Other route.   0  . PARoxetine (PAXIL) 20 MG  tablet Take 20 mg by mouth every morning.  0  . PROAIR HFA 108 (90 BASE) MCG/ACT inhaler as needed.    . sertraline (ZOLOFT) 50 MG tablet Take 75 mg by mouth daily.     . solifenacin (VESICARE) 5 MG tablet Take 1 tablet (5 mg total) by mouth at bedtime. 30 tablet 6  . traMADol (ULTRAM) 50 MG tablet Take 1 tablet (50 mg total) by mouth every 6 (six) hours as needed. 30 tablet 3   No current facility-administered medications for this visit.    Allergies:   Codeine    Social History:  The patient  reports that she has never smoked. She has never used smokeless tobacco. She reports that she does not drink alcohol or use illicit drugs.   Family History:  The patient's family history includes CAD (age of onset: 24) in her father; Heart attack in her father; Hypertension in her mother; Stroke (age of onset: 17) in her mother. There is no history of Anesthesia problems.    ROS:   Please see the history of present illness.   Review of Systems  HENT: Positive for headaches.   Respiratory: Positive for snoring.   Hematologic/Lymphatic: Bruises/bleeds easily.  Gastrointestinal: Positive for diarrhea.  Psychiatric/Behavioral: Positive for depression.  All other systems reviewed and are negative.    PHYSICAL EXAM: VS:  BP 160/80 mmHg  Pulse 84  Ht 5\' 3"  (1.6 m)  Wt 193 lb (87.544 kg)  BMI 34.20 kg/m2    Wt Readings from Last 3 Encounters:  11/08/14 200 lb (90.719 kg)  09/03/14 197 lb (89.359 kg)  08/18/14 194 lb (87.998 kg)     GEN: Well nourished, well developed, in no acute distress HEENT: normal Neck: no JVD at 90, no masses Cardiac:  Normal S1/S2, RRR; 9-6/7 systolic murmur at the RUSB,  no rubs or gallops, no edema  Respiratory:  clear to auscultation bilaterally, no wheezing, rhonchi or rales. GI: soft, nontender, nondistended, + BS MS: no deformity or atrophy Skin: warm and dry  Neuro:  CNs II-XII intact, Strength and sensation are intact Psych: Normal affect   EKG:   EKG is ordered today.  It demonstrates:   There is increased artifact related to restless leg syndrome which affects interpretation somewhat, NSR, HR 84, normal axis, no obvious ST changes   Recent Labs: 07/14/2014: TSH 2.030    Lipid Panel    Component Value Date/Time   CHOL 117 10/14/2012 0110   TRIG 175* 10/14/2012 0110   HDL 42 10/14/2012 0110   CHOLHDL 2.8 10/14/2012 0110   VLDL 35 10/14/2012 0110   LDLCALC 40 10/14/2012 0110      ASSESSMENT AND PLAN:  Pre-operative cardiovascular examination She does not have any  unstable cardiac conditions. She had a cardiac catheterization 2 years ago with normal coronary arteries aside from a very tiny ramus intermedius branch with ostial stenosis. She had normal LV function by left ventriculogram at the time of her cardiac catheterization. She is fairly sedentary and cannot achieve 4 METs. I cannot really assess her functional status. She does have a systolic murmur on exam. She does have dyspnea with minimal activities at home. I reviewed her case today with Dr. Johnsie Cancel. As she had normal coronary arteries at the time of her cardiac catheterization just 2 years ago, she does not require stress testing. However, I will obtain an echocardiogram. As long as her LV function remains stable without significant valvular abnormalities, she will be able to proceed with her noncardiac surgery.   Coronary artery disease involving native coronary artery of native heart without angina pectoris Minimal CAD by cardiac catheterization 2 years ago. Continue aspirin, beta blocker, ACE inhibitor, statin.  Essential hypertension Blood pressure above target. Continue to monitor and follow up with primary care.   Hyperlipidemia Continue statin.  Stroke Continue ASA, statin.   Heart murmur   Plan: ECHOCARDIOGRAM COMPLETE    Current medicines are reviewed at length with the patient today.  Concerns regarding medicines are as outlined above.  The following  changes have been made:    None    Labs/ tests ordered today include:  Orders Placed This Encounter  Procedures  . EKG 12-Lead  . ECHOCARDIOGRAM COMPLETE    Disposition:   FU with Dr. Jenkins Rouge as needed.  Decision regarding cardiac clearance for surgery will be made after Echocardiogram is interpreted.     Signed, Versie Starks, MHS 12/02/2014 11:46 AM    [facility] [place], [place], [place]  [address] Phone: [phone]; Fax: [phone]

## 2014-12-02 ENCOUNTER — Ambulatory Visit (INDEPENDENT_AMBULATORY_CARE_PROVIDER_SITE_OTHER): Payer: Medicare Other | Admitting: Physician Assistant

## 2014-12-02 ENCOUNTER — Encounter: Payer: Self-pay | Admitting: Physician Assistant

## 2014-12-02 VITALS — BP 160/80 | HR 84 | Ht 63.0 in | Wt 193.0 lb

## 2014-12-02 DIAGNOSIS — I10 Essential (primary) hypertension: Secondary | ICD-10-CM | POA: Diagnosis not present

## 2014-12-02 DIAGNOSIS — Z01810 Encounter for preprocedural cardiovascular examination: Secondary | ICD-10-CM

## 2014-12-02 DIAGNOSIS — E785 Hyperlipidemia, unspecified: Secondary | ICD-10-CM | POA: Diagnosis not present

## 2014-12-02 DIAGNOSIS — I639 Cerebral infarction, unspecified: Secondary | ICD-10-CM

## 2014-12-02 DIAGNOSIS — I2510 Atherosclerotic heart disease of native coronary artery without angina pectoris: Secondary | ICD-10-CM | POA: Diagnosis not present

## 2014-12-02 DIAGNOSIS — R011 Cardiac murmur, unspecified: Secondary | ICD-10-CM

## 2014-12-02 NOTE — Patient Instructions (Signed)
Medication Instructions: Your physician recommends that you continue on your current medications as directed. Please refer to the Current Medication list given to you today.   Labwork:   Testing/Procedures: Your physician has requested that you have an echocardiogram. Echocardiography is a painless test that uses sound waves to create images of your heart. It provides your doctor with information about the size and shape of your heart and how well your heart's chambers and valves are working. This procedure takes approximately one hour. There are no restrictions for this procedure.    Follow-Up:  WITH DR Johnsie Cancel AS NEEDED   Any Other Special Instructions Will Be Listed Below (If Applicable).

## 2014-12-07 ENCOUNTER — Ambulatory Visit (HOSPITAL_COMMUNITY): Payer: Medicare Other | Attending: Cardiovascular Disease

## 2014-12-07 ENCOUNTER — Other Ambulatory Visit: Payer: Self-pay

## 2014-12-07 DIAGNOSIS — Z01810 Encounter for preprocedural cardiovascular examination: Secondary | ICD-10-CM | POA: Diagnosis present

## 2014-12-07 DIAGNOSIS — R011 Cardiac murmur, unspecified: Secondary | ICD-10-CM

## 2014-12-08 ENCOUNTER — Encounter: Payer: Self-pay | Admitting: Physician Assistant

## 2014-12-10 ENCOUNTER — Telehealth: Payer: Self-pay | Admitting: Physician Assistant

## 2014-12-10 NOTE — Telephone Encounter (Signed)
New problem   Pt's spouse returning a call back from [place] from yesterday. Please call

## 2014-12-10 NOTE — Telephone Encounter (Signed)
Patient's husband Kahley Leib [facility]) called about patient's echo results. Per Richardson Dopp PA, Echo okay with normal LV function and no significant valvular abnormalities. Patient's husband verbalized understanding and had no other questions at this time.

## 2014-12-28 ENCOUNTER — Encounter: Payer: Self-pay | Admitting: Neurology

## 2014-12-28 ENCOUNTER — Ambulatory Visit (INDEPENDENT_AMBULATORY_CARE_PROVIDER_SITE_OTHER): Payer: Medicare Other | Admitting: Neurology

## 2014-12-28 VITALS — BP 149/75 | HR 91 | Ht 63.0 in | Wt 190.0 lb

## 2014-12-28 DIAGNOSIS — G40909 Epilepsy, unspecified, not intractable, without status epilepticus: Secondary | ICD-10-CM | POA: Diagnosis not present

## 2014-12-28 DIAGNOSIS — I639 Cerebral infarction, unspecified: Secondary | ICD-10-CM | POA: Diagnosis not present

## 2014-12-28 DIAGNOSIS — G4733 Obstructive sleep apnea (adult) (pediatric): Secondary | ICD-10-CM

## 2014-12-28 NOTE — Progress Notes (Signed)
Subjective:    Patient ID: Martha Olsen is a 79 y.o. female.  HPI      Dear Aliene Beams,   I saw your patient, Martha Olsen, upon your kind request in my clinic today for initial consultation of her sleep disturbance, after her recent sleep studies. The patient is accompanied by her husband today. As you know, Martha Olsen is a 79 year old right-handed woman with an underlying medical history of right basal ganglia stroke in May 2003, hyperlipidemia, diabetes, anxiety, depression, arthritis, hypertension, complex partial seizures, obesity, s/p appendectomy, s/p hysterectomy, DJD, s/p left hip replacement 03/2002, left knee replacement 01/2011, foot surgery and reverse left shoulder arthroplasty 11/2011, who you had referred for sleep study due to a report of recurrent headaches and excessive daytime somnolence. The patient had a baseline sleep study on 08/25/2014 Her sleep efficiency was markedly reduced at 45.8% with a prolonged sleep latency of 134 minutes and wake after sleep onset of 132 minutes with moderate sleep fragmentation noted. Of note, the patient required quite a bit of assistance and a second person to help for her to go to the bathroom which happened twice during the study. She had an increased percentage of stage I and stage II sleep, absence of slow-wave sleep and a minimal amount of REM sleep at 2.9% with a very prolonged REM latency of 336 minutes. She had moderate PLMS at 44 per hour with an associated arousal index of 5.1 per hour. She had mild intermittent snoring. She had 13 obstructive apneas and 33 obstructive hypopneas as well as 6 central apneas. Total AHI was 13.9 per hour rising to 73.8 per hour during REM sleep. Baseline oxygen saturation was 94%, nadir was 89%. She was then seen by Dr. Felecia Shelling on 09/03/14 for discussion of her sleep test results on my behalf. She agreed to come back for CPAP titration study. She had a CPAP titration study on 10/02/2014 and underwent over her test  results with her in detail today. Sleep efficiency was 84.6%, latency to sleep was 5.5 minutes of, wake after sleep onset was 67.5 minutes with mild sleep fragmentation noted. She had an increased percentage of stage II sleep, and absence of REM sleep. She had moderate PLMS with an index of 35.5 per hour, and an associated arousal index of 2.5 per hour. Average oxygen saturation was 93%, nadir was 88%. She was titrated from 5-11 cm of water pressure on CPAP with a full facemask. She indicated that she had slept better with CPAP. Based on the test results I ordered CPAP therapy for home use. However, she has not been set up with CPAP therapy.  Today, 12/28/2014: she reports no new symptoms. Her husband states, that they did not get a call back from the [facility], and they were waiting for a call back from them. We did talk to the [facility], that received her referral. They do not need anything further from [place] but the patient never returned their phone calls.   Her Past Medical History Is Significant For: Past Medical History  Diagnosis Date  . Stroke 2002    right side weakness  . Hypertension   . Diabetes mellitus 2011  . Headache(784.0)   . Anxiety   . Depression   . Seizure   . History of echocardiogram     Echo 5/16: EF 55-60%, normal wall motion, grade 1 diastolic dysfunction    Her Past Surgical History Is Significant For: Past Surgical History  Procedure Laterality Date  .  Foot surgery    . Cholecystectomy  1993  . Tonsillectomy  1951  . Eye surgery  2009  . Abdominal hysterectomy  1980's  . Appendectomy  1980's  . Joint replacement  2012    Hip, Knee  . Reverse shoulder arthroplasty  12/07/2011    Procedure: REVERSE SHOULDER ARTHROPLASTY;  Surgeon: Marin Shutter, MD;  Location: [facility];  Service: Orthopedics;  Laterality: Left;  left total reverse shoulder    Her Family History Is Significant For: Family History  Problem Relation Age of Onset  . Anesthesia  problems Neg Hx   . CAD Father 35  . Stroke Mother 76  . Heart attack Father   . Hypertension Mother     Her Social History Is Significant For: History   Social History  . Marital Status: Married    Spouse Name: Denyse Amass  . Number of Children: 4  . Years of Education: 9th   Occupational History  . Chartered certified accountant     Retired   Social History Main Topics  . Smoking status: Never Smoker   . Smokeless tobacco: Never Used  . Alcohol Use: No  . Drug Use: No  . Sexual Activity: Not on file   Other Topics Concern  . None   Social History Narrative   Son recently died of cancer.  Lives at home with husband Thomes Cake) .  She has 3 children .    Education 10 th grade.   Right handed.          Her Allergies Are:  Allergies  Allergen Reactions  . Codeine Hives  :   Her Current Medications Are:  Outpatient Encounter Prescriptions as of 12/28/2014  Medication Sig  . aspirin 81 MG chewable tablet Chew 1 tablet (81 mg total) by mouth daily.  Marland Kitchen atorvastatin (LIPITOR) 40 MG tablet Take 40 mg by mouth daily.  . busPIRone (BUSPAR) 15 MG tablet Take 15 mg by mouth 3 (three) times daily.  . carvedilol (COREG) 6.25 MG tablet Take 1 tablet (6.25 mg total) by mouth 2 (two) times daily with a meal.  . levETIRAcetam (KEPPRA) 500 MG tablet Take 1 tablet (500 mg total) by mouth 2 (two) times daily.  Marland Kitchen lisinopril (PRINIVIL,ZESTRIL) 20 MG tablet Take 10 mg by mouth daily.   Marland Kitchen LORazepam (ATIVAN) 2 MG tablet Take 2 mg by mouth as needed.  . metFORMIN (GLUCOPHAGE) 1000 MG tablet 1,000 mg daily.  . nitroGLYCERIN (NITROSTAT) 0.4 MG SL tablet Place 1 tablet (0.4 mg total) under the tongue every 5 (five) minutes as needed for chest pain.  . ONE TOUCH ULTRA TEST test strip 1 each by Other route.   Marland Kitchen PARoxetine (PAXIL) 20 MG tablet Take 20 mg by mouth every morning.  Marland Kitchen PROAIR HFA 108 (90 BASE) MCG/ACT inhaler as needed.  . ciprofloxacin (CIPRO) 250 MG tablet Take 250 mg by mouth 2 (two) times daily.  Marland Kitchen  gabapentin (NEURONTIN) 300 MG capsule Take 1 capsule (300 mg total) by mouth at bedtime. (Patient not taking: Reported on 12/28/2014)  . glimepiride (AMARYL) 2 MG tablet Take 2 mg by mouth daily before breakfast.  . Omega-3 Fatty Acids (FISH OIL) 1200 MG CAPS Take 2,400 mg by mouth 2 (two) times daily.  . sertraline (ZOLOFT) 50 MG tablet Take 75 mg by mouth daily.   . solifenacin (VESICARE) 5 MG tablet Take 1 tablet (5 mg total) by mouth at bedtime. (Patient not taking: Reported on 12/28/2014)  . traMADol (ULTRAM) 50 MG tablet  Take 1 tablet (50 mg total) by mouth every 6 (six) hours as needed. (Patient not taking: Reported on 12/28/2014)   No facility-administered encounter medications on file as of 12/28/2014.  :  Review of Systems:  Out of a complete 14 point review of systems, all are reviewed and negative with the exception of these symptoms as listed below:   Review of Systems  Gastrointestinal: Positive for diarrhea.       Diarrhea related to medication   Neurological: Positive for headaches.       Trouble sleeping     Objective:  Neurologic Exam  Physical Exam Physical Examination:   Filed Vitals:   12/28/14 1402  BP: 149/75  Pulse: 91    General Examination: The patient is a very pleasant 79 y.o. female in no acute distress. She appears well-developed and well-nourished and adequately groomed. She is obese. She brought in a single prong cane.   HEENT: Normocephalic, atraumatic, pupils are equal, round and reactive to light and accommodation. Funduscopic exam is normal with sharp disc margins noted. Extraocular tracking is good without limitation to gaze excursion or nystagmus noted. Normal smooth pursuit is noted. Hearing is grossly intact. Tympanic membranes are clear bilaterally. Face is symmetric with normal facial animation and normal facial sensation. Speech is clear with no dysarthria noted. There is no hypophonia. There is no lip, neck/head, jaw or voice tremor. Neck is  supple with full range of passive and active motion. There are no carotid bruits on auscultation. Oropharynx exam reveals: mild mouth dryness, she has full dentures in the upper jaw and is edentulous in her lower jaw. She did not put her dentures and she says. Mallampati is class II. Airway is mildly crowded appearing secondary to redundant soft palate. Tonsils are absent.   Chest: Clear to auscultation without wheezing, rhonchi or crackles noted.  Heart: S1+S2+0, regular and normal without murmurs, rubs or gallops noted.   Abdomen: Soft, non-tender and non-distended with normal bowel sounds appreciated on auscultation.  Extremities: There is no pitting edema in the distal lower extremities bilaterally. Pedal pulses are intact.  Skin: Warm and dry without trophic changes noted. There are no varicose veins.  Musculoskeletal: exam reveals no obvious joint deformities, tenderness or joint swelling or erythema.   Neurologically:  Mental status: The patient is awake, alert and oriented in all 4 spheres. Her immediate and remote memory, attention, language skills and fund of knowledge are fairly appropriate. There is no evidence of aphasia, agnosia, apraxia or anomia. Speech is  slightly difficult to understand secondary to missing teeth. Cranial nerves II - XII are as described above under HEENT exam. In addition: shoulder shrug is normal with equal shoulder height noted. Motor exam: Normal bulk, strength and tone is noted, with the exception of mild left-sided weakness. Reflexes are 2+ throughout. Fine motor skills and coordination:  are mildly impaired. Cerebellar testing: No dysmetria or intention tremor on finger to nose testing. Heel to shin is unremarkable bilaterally. There is no truncal or gait ataxia.  Sensory exam: intact to light touch in the upper and lower extremities.  Gait, station and balance: stands up with mild difficulty. She has to push herself up. She requires some assistance from  her husband. She uses a single prong cane on the right. She walks with decreased arm swing on the left. She has inversion of her left foot. She has a mild limp. Tandem walk is not possible.   Assessment and Plan:   In summary, Martha  DEANDRIA Olsen is a very pleasant 79 y.o.-year old female with an underlying medical history of right basal ganglia stroke in May 2003, hyperlipidemia, diabetes, anxiety, depression, arthritis, hypertension, complex partial seizures, obesity, s/p appendectomy, s/p hysterectomy, DJD, s/p left hip replacement 03/2002, left knee replacement 01/2011, foot surgery and reverse left shoulder arthroplasty 11/2011, who presents for follow-up consultation of her obstructive sleep apnea. She has undergone a baseline sleep study in February 2016 and a full night CPAP titration study in March 2016. Today, I discussed her sleep study results with her and her husband in detail. Overall, she was noted to have mild obstructive sleep apnea. She did fairly well with CPAP therapy. Given her medical history, she is advised to try CPAP therapy at home. I have made a referral and fax the results to her DME company. She is advised to call them back as the have tried to schedule a CPAP set up with them. They are willing to call the DME company back. The patient is willing to embark on treatment. I would like to see her back in about 2 months or so for recheck while on CPAP therapy. She has an appointment with you in August which I asked her to keep. I explained the importance of being compliant with PAP treatment, not only for insurance purposes but primarily to improve Her symptoms, and for the patient's long term health benefit, including to reduce Her cardiovascular risks. I answered all their questions today and the patient and her husband were in agreement.  Thank you very much for allowing me to participate in the care of this nice patient. If I can be of any further assistance to you please do not hesitate  to talk to me.   Sincerely,   Star Age, MD, PhD  I spent 15 minutes in total face-to-face time with the patient, more than 50% of which was spent in counseling and coordination of care, reviewing test results, reviewing medication and discussing or reviewing the diagnosis of OSA, its prognosis and treatment options.

## 2014-12-28 NOTE — Patient Instructions (Signed)
I have placed an order for your CPAP machine for home use. Please call your DME company this week for set up.  I will see you back in about 10 weeks for follow up of your sleep apnea.   Please use your CPAP regularly. While your insurance requires that you use CPAP at least 4 hours each night on 70% of the nights, I recommend, that you not skip any nights and use it throughout the night if you can. Getting used to CPAP and staying with the treatment long term does take time and patience and discipline. Untreated obstructive sleep apnea when it is moderate to severe can have an adverse impact on cardiovascular health and raise her risk for heart disease, arrhythmias, hypertension, congestive heart failure, stroke and diabetes. Untreated obstructive sleep apnea causes sleep disruption, nonrestorative sleep, and sleep deprivation. This can have an impact on your day to day functioning and cause daytime sleepiness and impairment of cognitive function, memory loss, mood disturbance, and problems focussing. Using CPAP regularly can improve these symptoms.

## 2014-12-31 ENCOUNTER — Telehealth: Payer: Self-pay | Admitting: Neurology

## 2014-12-31 NOTE — Telephone Encounter (Signed)
Ryan called and requested to speak with someone regarding the patients CPAP machine and the pressure that it should be set on. Please call and advise.

## 2014-12-31 NOTE — Telephone Encounter (Signed)
Patient is returning a call from our number.  She was unaware who called.  Could you please call her. Thanks!

## 2014-12-31 NOTE — Telephone Encounter (Signed)
Called Ryan back she has the information she needs.

## 2014-12-31 NOTE — Telephone Encounter (Addendum)
Called patient and back and relayed it was DME company everything was fine.

## 2015-01-22 ENCOUNTER — Emergency Department (HOSPITAL_COMMUNITY)
Admission: EM | Admit: 2015-01-22 | Discharge: 2015-01-22 | Disposition: A | Discharge status: Home / Self Care | Payer: Medicare Other | Attending: Emergency Medicine | Admitting: Emergency Medicine

## 2015-01-22 ENCOUNTER — Emergency Department (HOSPITAL_COMMUNITY): Payer: Medicare Other

## 2015-01-22 ENCOUNTER — Encounter (HOSPITAL_COMMUNITY): Payer: Self-pay

## 2015-01-22 DIAGNOSIS — Z8673 Personal history of transient ischemic attack (TIA), and cerebral infarction without residual deficits: Secondary | ICD-10-CM | POA: Diagnosis not present

## 2015-01-22 DIAGNOSIS — I10 Essential (primary) hypertension: Secondary | ICD-10-CM | POA: Insufficient documentation

## 2015-01-22 DIAGNOSIS — Z7982 Long term (current) use of aspirin: Secondary | ICD-10-CM | POA: Insufficient documentation

## 2015-01-22 DIAGNOSIS — Z7951 Long term (current) use of inhaled steroids: Secondary | ICD-10-CM | POA: Insufficient documentation

## 2015-01-22 DIAGNOSIS — G40909 Epilepsy, unspecified, not intractable, without status epilepticus: Secondary | ICD-10-CM | POA: Insufficient documentation

## 2015-01-22 DIAGNOSIS — Z79899 Other long term (current) drug therapy: Secondary | ICD-10-CM | POA: Diagnosis not present

## 2015-01-22 DIAGNOSIS — N390 Urinary tract infection, site not specified: Secondary | ICD-10-CM | POA: Diagnosis not present

## 2015-01-22 DIAGNOSIS — F329 Major depressive disorder, single episode, unspecified: Secondary | ICD-10-CM | POA: Insufficient documentation

## 2015-01-22 DIAGNOSIS — R4182 Altered mental status, unspecified: Secondary | ICD-10-CM | POA: Diagnosis present

## 2015-01-22 DIAGNOSIS — E119 Type 2 diabetes mellitus without complications: Secondary | ICD-10-CM | POA: Insufficient documentation

## 2015-01-22 DIAGNOSIS — F419 Anxiety disorder, unspecified: Secondary | ICD-10-CM | POA: Insufficient documentation

## 2015-01-22 LAB — CBC WITH DIFFERENTIAL/PLATELET
BASOS PCT: 0 % (ref 0–1)
Basophils Absolute: 0 10*3/uL (ref 0.0–0.1)
EOS ABS: 0.1 10*3/uL (ref 0.0–0.7)
Eosinophils Relative: 1 % (ref 0–5)
HCT: 36.9 % (ref 36.0–46.0)
Hemoglobin: 12.1 g/dL (ref 12.0–15.0)
Lymphocytes Relative: 24 % (ref 12–46)
Lymphs Abs: 2.3 10*3/uL (ref 0.7–4.0)
MCH: 29.9 pg (ref 26.0–34.0)
MCHC: 32.8 g/dL (ref 30.0–36.0)
MCV: 91.1 fL (ref 78.0–100.0)
Monocytes Absolute: 0.8 10*3/uL (ref 0.1–1.0)
Monocytes Relative: 8 % (ref 3–12)
NEUTROS PCT: 67 % (ref 43–77)
Neutro Abs: 6.4 10*3/uL (ref 1.7–7.7)
Platelets: 265 10*3/uL (ref 150–400)
RBC: 4.05 MIL/uL (ref 3.87–5.11)
RDW: 14 % (ref 11.5–15.5)
WBC: 9.6 10*3/uL (ref 4.0–10.5)

## 2015-01-22 LAB — COMPREHENSIVE METABOLIC PANEL
ALBUMIN: 3.8 g/dL (ref 3.5–5.0)
ALT: 16 U/L (ref 14–54)
ANION GAP: 11 (ref 5–15)
AST: 23 U/L (ref 15–41)
Alkaline Phosphatase: 59 U/L (ref 38–126)
BUN: 10 mg/dL (ref 6–20)
CALCIUM: 9.9 mg/dL (ref 8.9–10.3)
CO2: 28 mmol/L (ref 22–32)
Chloride: 102 mmol/L (ref 101–111)
Creatinine, Ser: 0.53 mg/dL (ref 0.44–1.00)
GFR calc non Af Amer: >60 mL/min (ref >60)
Glucose, Bld: 105 mg/dL — ABNORMAL HIGH (ref 65–99)
Potassium: 3.7 mmol/L (ref 3.5–5.1)
SODIUM: 141 mmol/L (ref 135–145)
TOTAL PROTEIN: 6.9 g/dL (ref 6.5–8.1)
Total Bilirubin: 0.8 mg/dL (ref 0.3–1.2)

## 2015-01-22 LAB — URINALYSIS, ROUTINE W REFLEX MICROSCOPIC
BILIRUBIN URINE: NEGATIVE
Glucose, UA: NEGATIVE mg/dL
Hgb urine dipstick: NEGATIVE
Ketones, ur: NEGATIVE mg/dL
NITRITE: POSITIVE — AB
Protein, ur: NEGATIVE mg/dL
Specific Gravity, Urine: 1.016 (ref 1.005–1.030)
UROBILINOGEN UA: 0.2 mg/dL (ref 0.0–1.0)
pH: 7.5 (ref 5.0–8.0)

## 2015-01-22 LAB — URINE MICROSCOPIC-ADD ON

## 2015-01-22 MED ORDER — CEPHALEXIN 500 MG PO CAPS: 500.0000 mg | ORAL_CAPSULE | Freq: Four times a day (QID) | ORAL | Status: DC

## 2015-01-22 MED ORDER — DEXTROSE 5 % IV SOLN
1.0000 g | Freq: Once | INTRAVENOUS | Status: AC
  Administered 2015-01-22: 1 g via INTRAVENOUS
  Filled 2015-01-22: qty 10

## 2015-01-22 NOTE — Discharge Instructions (Signed)
Follow up with your md next week. °

## 2015-01-22 NOTE — ED Provider Notes (Signed)
CSN: [id number]     Arrival date & time 01/22/15  1032 History   First MD Initiated Contact with Patient 01/22/15 1042     Chief Complaint  Patient presents with  . Altered Mental Status     (Consider location/radiation/quality/duration/timing/severity/associated sxs/prior Treatment) Patient is a 79 y.o. female presenting with altered mental status. The history is provided by a relative (pt has had some episodes of confusion.  worse today but she is back to normal now.  she has been having confusion for weeks).  Altered Mental Status Presenting symptoms: behavior changes and confusion   Severity:  Mild Most recent episode:  Today Timing:  Intermittent Progression:  Resolved Chronicity:  Recurrent Context: not alcohol use   Associated symptoms: no abdominal pain, no hallucinations, no headaches, no rash and no seizures     Past Medical History  Diagnosis Date  . Stroke 2002    right side weakness  . Hypertension   . Diabetes mellitus 2011  . Headache(784.0)   . Anxiety   . Depression   . Seizure   . History of echocardiogram     Echo 5/16: EF 55-60%, normal wall motion, grade 1 diastolic dysfunction   Past Surgical History  Procedure Laterality Date  . Foot surgery    . Cholecystectomy  1993  . Tonsillectomy  1951  . Eye surgery  2009  . Abdominal hysterectomy  1980's  . Appendectomy  1980's  . Joint replacement  2012    Hip, Knee  . Reverse shoulder arthroplasty  12/07/2011    Procedure: REVERSE SHOULDER ARTHROPLASTY;  Surgeon: Marin Shutter, MD;  Location: [facility];  Service: Orthopedics;  Laterality: Left;  left total reverse shoulder   Family History  Problem Relation Age of Onset  . Anesthesia problems Neg Hx   . CAD Father 57  . Stroke Mother 66  . Heart attack Father   . Hypertension Mother    History  Substance Use Topics  . Smoking status: Never Smoker   . Smokeless tobacco: Never Used  . Alcohol Use: No   OB History    No data available      Review of Systems  Constitutional: Negative for appetite change and fatigue.  HENT: Negative for congestion, ear discharge and sinus pressure.   Eyes: Negative for discharge.  Respiratory: Negative for cough.   Cardiovascular: Negative for chest pain.  Gastrointestinal: Negative for abdominal pain and diarrhea.  Genitourinary: Negative for frequency and hematuria.  Musculoskeletal: Negative for back pain.  Skin: Negative for rash.  Neurological: Negative for seizures and headaches.  Psychiatric/Behavioral: Positive for confusion. Negative for hallucinations.      Allergies  Metformin and related and Codeine  Home Medications   Prior to Admission medications   Medication Sig Start Date End Date Taking? Authorizing Provider  aspirin 81 MG chewable tablet Chew 1 tablet (81 mg total) by mouth daily. 10/14/12  Yes Barton Dubois, MD  atorvastatin (LIPITOR) 40 MG tablet Take 40 mg by mouth daily.   Yes Historical Provider, MD  busPIRone (BUSPAR) 15 MG tablet Take 15 mg by mouth 3 (three) times daily.   Yes Historical Provider, MD  carvedilol (COREG) 6.25 MG tablet Take 1 tablet (6.25 mg total) by mouth 2 (two) times daily with a meal. 11/17/14  Yes Marcial Pacas, MD  glimepiride (AMARYL) 2 MG tablet Take 2 mg by mouth daily before breakfast.   Yes Historical Provider, MD  ibuprofen (ADVIL,MOTRIN) 200 MG tablet Take 200 mg by mouth  every 6 (six) hours as needed for mild pain.   Yes Historical Provider, MD  levETIRAcetam (KEPPRA) 500 MG tablet Take 1 tablet (500 mg total) by mouth 2 (two) times daily. 07/14/14  Yes Marcial Pacas, MD  lisinopril-hydrochlorothiazide (PRINZIDE,ZESTORETIC) 20-12.5 MG per tablet Take 1 tablet by mouth daily.   Yes Historical Provider, MD  metFORMIN (GLUCOPHAGE) 1000 MG tablet Take 1,000 mg by mouth 2 (two) times daily with a meal.  07/01/14  Yes Historical Provider, MD  ONE TOUCH ULTRA TEST test strip 1 each by Other route 2 (two) times daily as needed (blood sugar).   05/02/14  Yes Historical Provider, MD  PARoxetine (PAXIL) 20 MG tablet Take 20 mg by mouth every morning. 11/02/14  Yes Historical Provider, MD  PROAIR HFA 108 (90 BASE) MCG/ACT inhaler Inhale 1-2 puffs into the lungs every 6 (six) hours as needed for wheezing or shortness of breath.  10/04/12  Yes Historical Provider, MD  ranitidine (ZANTAC) 150 MG tablet Take 150 mg by mouth 2 (two) times daily as needed for heartburn.   Yes Historical Provider, MD  sertraline (ZOLOFT) 50 MG tablet Take 75 mg by mouth daily.    Yes Historical Provider, MD  cephALEXin (KEFLEX) 500 MG capsule Take 1 capsule (500 mg total) by mouth 4 (four) times daily. 01/22/15   Milton Ferguson, MD  ciprofloxacin (CIPRO) 250 MG tablet Take 250 mg by mouth 2 (two) times daily. 12/22/14   Historical Provider, MD  gabapentin (NEURONTIN) 300 MG capsule Take 1 capsule (300 mg total) by mouth at bedtime. Patient not taking: Reported on 12/28/2014 09/03/14   Britt Bottom, MD  lisinopril (PRINIVIL,ZESTRIL) 20 MG tablet Take 10 mg by mouth daily.  10/05/12   Historical Provider, MD  LORazepam (ATIVAN) 2 MG tablet Take 2 mg by mouth as needed. 12/18/14   Historical Provider, MD  nitroGLYCERIN (NITROSTAT) 0.4 MG SL tablet Place 1 tablet (0.4 mg total) under the tongue every 5 (five) minutes as needed for chest pain. 12/11/12   Josue Hector, MD  Omega-3 Fatty Acids (FISH OIL) 1200 MG CAPS Take 2,400 mg by mouth 2 (two) times daily.    Historical Provider, MD  solifenacin (VESICARE) 5 MG tablet Take 1 tablet (5 mg total) by mouth at bedtime. Patient not taking: Reported on 12/28/2014 08/18/14   Marcial Pacas, MD  traMADol (ULTRAM) 50 MG tablet Take 1 tablet (50 mg total) by mouth every 6 (six) hours as needed. Patient not taking: Reported on 12/28/2014 07/14/14   Marcial Pacas, MD   BP 166/98 mmHg  Pulse 85  Temp(Src) 98.4 F (36.9 C) (Oral)  Resp 22  SpO2 95% Physical Exam  Constitutional: She is oriented to person, place, and time. She appears  well-developed.  HENT:  Head: Normocephalic.  Eyes: Conjunctivae and EOM are normal. No scleral icterus.  Neck: Neck supple. No thyromegaly present.  Cardiovascular: Normal rate and regular rhythm.  Exam reveals no gallop and no friction rub.   No murmur heard. Pulmonary/Chest: No stridor. She has no wheezes. She has no rales. She exhibits no tenderness.  Abdominal: She exhibits no distension. There is no tenderness. There is no rebound.  Musculoskeletal: Normal range of motion. She exhibits no edema.  Lymphadenopathy:    She has no cervical adenopathy.  Neurological: She is oriented to person, place, and time. She exhibits normal muscle tone. Coordination normal.  Skin: No rash noted. No erythema.  Psychiatric: She has a normal mood and affect. Her behavior is  normal.    ED Course  Procedures (including critical care time) Labs Review Labs Reviewed  COMPREHENSIVE METABOLIC PANEL - Abnormal; Notable for the following:    Glucose, Bld 105 (*)    All other components within normal limits  URINALYSIS, ROUTINE W REFLEX MICROSCOPIC (NOT AT [facility]) - Abnormal; Notable for the following:    APPearance CLOUDY (*)    Nitrite POSITIVE (*)    Leukocytes, UA MODERATE (*)    All other components within normal limits  URINE MICROSCOPIC-ADD ON - Abnormal; Notable for the following:    Squamous Epithelial / LPF FEW (*)    Bacteria, UA MANY (*)    All other components within normal limits  CBC WITH DIFFERENTIAL/PLATELET    Imaging Review Dg Chest 2 View  01/22/2015   CLINICAL DATA:  Confusion.  Right-sided weakness.  EXAM: CHEST  2 VIEW  COMPARISON:  10/12/2012  FINDINGS: Cardiac silhouette remains upper limits of normal in size. Vascular congestion on the prior study has resolved. There is no evidence of airspace consolidation, edema, pleural effusion, or pneumothorax. Prior left reverse shoulder arthroplasty is noted.  IMPRESSION: No active cardiopulmonary disease.   Electronically Signed   By:  Logan Bores   On: 01/22/2015 11:43   Ct Head Wo Contrast  01/22/2015   CLINICAL DATA:  Increasing confusion over the past several months. History of prior infarct. Initial encounter.  EXAM: CT HEAD WITHOUT CONTRAST  TECHNIQUE: Contiguous axial images were obtained from the base of the skull through the vertex without intravenous contrast.  COMPARISON:  Brain MRI 10/13/2012 and head CT scan 10/12/2012.  FINDINGS: Remote infarction in the right basal ganglia extending into the corona radiata appears unchanged. Hypoattenuation in the periventricular deep white matter consistent chronic microvascular ischemic change is again seen. There is no evidence of acute intracranial abnormality including hemorrhage, infarct, mass lesion, mass effect, midline shift or abnormal extra-axial fluid collection. The calvarium is intact. No hydrocephalus or pneumocephalus. Imaged paranasal sinuses and mastoid air cells are clear.  IMPRESSION: No acute abnormality.  Remote right basal ganglia and corona radiata infarct.  Chronic microvascular ischemic change.   Electronically Signed   By: Inge Rise M.D.   On: 01/22/2015 11:39     EKG Interpretation   Date/Time:  Friday January 22 2015 10:45:49 EDT Ventricular Rate:  82 PR Interval:    QRS Duration: 93 QT Interval:  422 QTC Calculation: 493 R Axis:   69 Text Interpretation:  Atrial fibrillation Borderline prolonged QT interval  Confirmed by Blade Scheff  MD, Sevanna Ballengee ([id number]) on 01/22/2015 11:31:19 AM      MDM   Final diagnoses:  UTI (lower urinary tract infection)      Pt with confusion and uti,  Confusion improved now.  Will tx uti with keflex and follow up with pcp this week  Milton Ferguson, MD 01/23/15 1455

## 2015-01-22 NOTE — ED Notes (Addendum)
Per family - pt has had increased confusion x several months. This morning, pt was more confused than normal and was "talking out of her head." Hx stroke 10-21yrs ago with some slight right-sided deficits in arm and some residual difficulty swallowing. Pt had bad seizure 40yrs ago. Pt is now more alert per family. Possible UTI per PCP, but concerned about other symptoms so sent pt to ED.   Pt has also fallen more often in the last several months per family. Reports that she might have hit head 2 weeks ago after falling in bathroom.

## 2015-02-09 ENCOUNTER — Ambulatory Visit (INDEPENDENT_AMBULATORY_CARE_PROVIDER_SITE_OTHER): Payer: Medicare Other | Admitting: Neurology

## 2015-02-09 ENCOUNTER — Encounter: Payer: Self-pay | Admitting: Neurology

## 2015-02-09 VITALS — BP 180/81 | HR 92 | Temp 99.2°F | Ht 63.0 in | Wt 185.4 lb

## 2015-02-09 DIAGNOSIS — R410 Disorientation, unspecified: Secondary | ICD-10-CM

## 2015-02-09 DIAGNOSIS — F05 Delirium due to known physiological condition: Secondary | ICD-10-CM

## 2015-02-09 DIAGNOSIS — G9341 Metabolic encephalopathy: Secondary | ICD-10-CM | POA: Diagnosis not present

## 2015-02-09 NOTE — Procedures (Signed)
     History: Martha Olsen is a 79 year old patient with a history of a memory disturbance, and a history of seizures on Keppra. She has had a recent urinary tract infection around 01/18/2015, and she has had episodes of confusion since that time. She is being evaluated for the confusion.  This is a routine EEG. No skull defects are noted. Medications include aspirin, Lipitor, BuSpar, Coreg, gabapentin, Amaryl, Keppra, lisinopril, Ativan, metformin, Paxil, pro-air, Zantac, Zoloft, Vesicare, and Ultram.  EEG classification: Dysrhythmia grade 2 left hemisphere  Description of the recording: The background rhythms of this recording consists of a moderately well modulated medium amplitude alpha rhythm of 9 Hz that is reactive to eye opening and closure. As the record progresses, photic stimulation was not performed, hyperventilation was performed, and this results in a minimal buildup of the background rhythm activities with mild symmetric slowing seen. Towards the end of the recording, occasional slight asymmetric theta slowing is seen emanating from the left temporal area, occasionally with sharp activity. Intermittent 2 Hz delta slowing is seen intermittently in a generalized fashion. At no time during the recording does there appear to be evidence of spike or spike-wave discharges. EKG monitor shows no evidence of cardiac rhythm abnormalities with a heart rate of 72.  Impression: This is a borderline EEG recording secondary to slight intermittent asymmetric theta slowing emanating from the left hemisphere with occasional sharp activity. This study suggests a lowered seizure threshold with a left brain focus. No electrographic seizures were seen. Electrographic abnormalities were quite subtle, however.

## 2015-02-09 NOTE — Progress Notes (Signed)
[facility]    Provider:  Dr Jaynee Eagles Referring Provider: Alroy Dust, L.[name], MD Primary Care Physician:  Donnie Coffin, MD  CC:  Recent confusion  HPI:  Martha Olsen is a 79 y.o. female here as a referral from Dr. Alroy Dust for memory/confusion. PMHx right basal ganglia stroke in May 2003, hyperlipidemia, diabetes, anxiety, depression, arthritis, hypertension, complex partial seizures, obesity. She is a patient of Dr. Rhea Belton but is seeing me today. She has been very forgetful. She fell seven times last week. She has had a UTI recently. She is calling her daughter mom. Since she had a UTI, the week before July 4th, 2 weeks ago she was saying strange things. She says she has been confused, her brain isn't feeling right. Better some days and worse on other days. Some days she is more lucid than others. Saturday was terrible, mother was crying and saying the house was not hers. She kept saying she wants to go home. She did not live there. It was crazy. She can't get the words out. She has been having UTIs and has been on antibiotics. She will sit and stare into space. She won't respond. She is not comprehending anything you are saying. They have an appointment with Dr. [name] on the 4th. She stiffens up and falls. Balance issue. She is not using her walker all the time. She is losing her balance. She had diarrhea this morning, recently getting worse. She is here with her family who provide most of the information.   Review of Systems: Patient complains of symptoms per HPI as well as the following symptoms: no CP, no SOB, no fever, no chills. Pertinent negatives per HPI. All others negative.   History   Social History  . Marital Status: Married    Spouse Name: Denyse Amass  . Number of Children: 4  . Years of Education: 9th   Occupational History  . Chartered certified accountant     Retired   Social History Main Topics  . Smoking status: Never Smoker   . Smokeless tobacco: Never Used  . Alcohol Use:  No  . Drug Use: No  . Sexual Activity: Not on file   Other Topics Concern  . Not on file   Social History Narrative   Son recently died of cancer.  Lives at home with husband Thomes Cake) .  She has 3 children .    Education 10 th grade.   Right handed.          Family History  Problem Relation Age of Onset  . Anesthesia problems Neg Hx   . CAD Father 14  . Stroke Mother 55  . Heart attack Father   . Hypertension Mother     Past Medical History  Diagnosis Date  . Stroke 2002    right side weakness  . Hypertension   . Diabetes mellitus 2011  . Headache(784.0)   . Anxiety   . Depression   . Seizure   . History of echocardiogram     Echo 5/16: EF 55-60%, normal wall motion, grade 1 diastolic dysfunction    Past Surgical History  Procedure Laterality Date  . Foot surgery    . Cholecystectomy  1993  . Tonsillectomy  1951  . Eye surgery  2009  . Abdominal hysterectomy  1980's  . Appendectomy  1980's  . Joint replacement  2012    Hip, Knee  . Reverse shoulder arthroplasty  12/07/2011    Procedure: REVERSE SHOULDER ARTHROPLASTY;  Surgeon: Marin Shutter,  MD;  Location: [facility];  Service: Orthopedics;  Laterality: Left;  left total reverse shoulder    Current Outpatient Prescriptions  Medication Sig Dispense Refill  . aspirin 81 MG chewable tablet Chew 1 tablet (81 mg total) by mouth daily. 30 tablet 2  . atorvastatin (LIPITOR) 40 MG tablet Take 40 mg by mouth daily.    . busPIRone (BUSPAR) 15 MG tablet Take 15 mg by mouth 3 (three) times daily.    . carvedilol (COREG) 6.25 MG tablet Take 1 tablet (6.25 mg total) by mouth 2 (two) times daily with a meal. 60 tablet 12  . cephALEXin (KEFLEX) 500 MG capsule Take 1 capsule (500 mg total) by mouth 4 (four) times daily. 28 capsule 0  . ciprofloxacin (CIPRO) 250 MG tablet Take 250 mg by mouth 2 (two) times daily.  0  . gabapentin (NEURONTIN) 300 MG capsule Take 1 capsule (300 mg total) by mouth at bedtime. (Patient not taking:  Reported on 12/28/2014) 30 capsule 5  . glimepiride (AMARYL) 2 MG tablet Take 2 mg by mouth daily before breakfast.    . ibuprofen (ADVIL,MOTRIN) 200 MG tablet Take 200 mg by mouth every 6 (six) hours as needed for mild pain.    Marland Kitchen levETIRAcetam (KEPPRA) 500 MG tablet Take 1 tablet (500 mg total) by mouth 2 (two) times daily. 60 tablet 11  . lisinopril (PRINIVIL,ZESTRIL) 20 MG tablet Take 10 mg by mouth daily.     Marland Kitchen lisinopril-hydrochlorothiazide (PRINZIDE,ZESTORETIC) 20-12.5 MG per tablet Take 1 tablet by mouth daily.    Marland Kitchen LORazepam (ATIVAN) 2 MG tablet Take 2 mg by mouth as needed.  0  . metFORMIN (GLUCOPHAGE) 1000 MG tablet Take 1,000 mg by mouth 2 (two) times daily with a meal.   1  . nitroGLYCERIN (NITROSTAT) 0.4 MG SL tablet Place 1 tablet (0.4 mg total) under the tongue every 5 (five) minutes as needed for chest pain. 25 tablet 3  . Omega-3 Fatty Acids (FISH OIL) 1200 MG CAPS Take 2,400 mg by mouth 2 (two) times daily.    . ONE TOUCH ULTRA TEST test strip 1 each by Other route 2 (two) times daily as needed (blood sugar).   0  . PARoxetine (PAXIL) 20 MG tablet Take 20 mg by mouth every morning.  0  . PROAIR HFA 108 (90 BASE) MCG/ACT inhaler Inhale 1-2 puffs into the lungs every 6 (six) hours as needed for wheezing or shortness of breath.     . ranitidine (ZANTAC) 150 MG tablet Take 150 mg by mouth 2 (two) times daily as needed for heartburn.    . sertraline (ZOLOFT) 50 MG tablet Take 75 mg by mouth daily.     . solifenacin (VESICARE) 5 MG tablet Take 1 tablet (5 mg total) by mouth at bedtime. (Patient not taking: Reported on 12/28/2014) 30 tablet 6  . traMADol (ULTRAM) 50 MG tablet Take 1 tablet (50 mg total) by mouth every 6 (six) hours as needed. (Patient not taking: Reported on 12/28/2014) 30 tablet 3   No current facility-administered medications for this visit.    Allergies as of 02/09/2015 - Review Complete 01/22/2015  Allergen Reaction Noted  . Metformin and related Diarrhea  01/22/2015  . Codeine Hives 11/29/2011    Vitals: There were no vitals taken for this visit. Last Weight:  Wt Readings from Last 1 Encounters:  12/28/14 190 lb (86.183 kg)   Last Height:   Ht Readings from Last 1 Encounters:  12/28/14 5\' 3"  (1.6 m)  Physical exam: Exam: Gen: NAD, conversant,                    CV: RRR, no MRG. No Carotid Bruits. No peripheral edema, warm, nontender Eyes: Conjunctivae clear without exudates or hemorrhage  Neuro: Detailed Neurologic Exam  Speech:    Speech is normal; fluent and spontaneous with normal comprehension.  Cognition:    The patient is oriented to person, year not month or day;      Cranial Nerves:    The pupils are equal, round, and reactive to light. Visual fields are full to finger confrontation. Extraocular movements are intact. Trigeminal sensation is intact and the muscles of mastication are normal. The face is symmetric. The palate elevates in the midline. Hearing intact. Voice is normal. Shoulder shrug is normal. The tongue has normal motion without fasciculations.    Gait:    Wide based  Motor Observation:    No asymmetry, no atrophy, and no involuntary movements noted. Tone:    Normal muscle tone.    Posture:    Posture is normal. normal erect    Strength:    Strength is 4+/V in the upper and lower limbs.        Assessment/Plan:  79 year old female with a PMHx right basal ganglia stroke in May 2003, hyperlipidemia, diabetes, anxiety, depression, arthritis, hypertension, complex partial seizures, obesity, with recent UTIs and increased confusion. She is a patient of Dr. [name] and is seeing me in the office today urgently. Will order labs, confusion is likely metabolic in nature but cannot rule out seizures given her history.  CBC, CMP  Urinalysis Repeat EEG F/u with Dr. Marjean Donna, MD  [facility] [address] [place], [place] [address]  Phone [phone] Fax  [phone]  A total of 30 minutes was spent in with this patient face to face. Over half this time was spent on counseling patient on the metabolic encephalopathy diagnosis and different therapeutic options available.

## 2015-02-10 ENCOUNTER — Telehealth: Payer: Self-pay | Admitting: Neurology

## 2015-02-10 ENCOUNTER — Encounter: Payer: Self-pay | Admitting: *Deleted

## 2015-02-10 LAB — URINALYSIS, ROUTINE W REFLEX MICROSCOPIC
Bilirubin, UA: NEGATIVE
Glucose, UA: NEGATIVE
LEUKOCYTES UA: NEGATIVE
Nitrite, UA: POSITIVE — AB
PH UA: 6 (ref 5.0–7.5)
RBC, UA: NEGATIVE
Specific Gravity, UA: >=1.03 — AB (ref 1.005–1.030)
UUROB: 0.2 mg/dL (ref 0.2–1.0)

## 2015-02-10 LAB — COMPREHENSIVE METABOLIC PANEL
ALT: 15 IU/L (ref 0–32)
AST: 16 IU/L (ref 0–40)
Albumin/Globulin Ratio: 2 (ref 1.1–2.5)
Albumin: 4.3 g/dL (ref 3.5–4.8)
Alkaline Phosphatase: 77 IU/L (ref 39–117)
BUN / CREAT RATIO: 29 — AB (ref 11–26)
BUN: 17 mg/dL (ref 8–27)
Bilirubin Total: 0.9 mg/dL (ref 0.0–1.2)
CO2: 25 mmol/L (ref 18–29)
Calcium: 10.1 mg/dL (ref 8.7–10.3)
Chloride: 99 mmol/L (ref 97–108)
Creatinine, Ser: 0.58 mg/dL (ref 0.57–1.00)
GFR calc Af Amer: 104 mL/min/{1.73_m2} (ref >59)
GFR calc non Af Amer: 90 mL/min/{1.73_m2} (ref >59)
GLOBULIN, TOTAL: 2.1 g/dL (ref 1.5–4.5)
GLUCOSE: 106 mg/dL — AB (ref 65–99)
Potassium: 4.5 mmol/L (ref 3.5–5.2)
Sodium: 142 mmol/L (ref 134–144)
Total Protein: 6.4 g/dL (ref 6.0–8.5)

## 2015-02-10 LAB — MICROSCOPIC EXAMINATION
Casts: NONE SEEN /lpf
Epithelial Cells (non renal): >10 /hpf — AB (ref 0–10)

## 2015-02-10 LAB — CBC
HEMATOCRIT: 35.3 % (ref 34.0–46.6)
HEMOGLOBIN: 11.1 g/dL (ref 11.1–15.9)
MCH: 28.7 pg (ref 26.6–33.0)
MCHC: 31.4 g/dL — ABNORMAL LOW (ref 31.5–35.7)
MCV: 91 fL (ref 79–97)
Platelets: 337 10*3/uL (ref 150–379)
RBC: 3.87 x10E6/uL (ref 3.77–5.28)
RDW: 15 % (ref 12.3–15.4)
WBC: 9.9 10*3/uL (ref 3.4–10.8)

## 2015-02-10 NOTE — Telephone Encounter (Signed)
Michelle: Please call patient, laboratory showed evidence of UTI, chart reviewed, there was multiple antibiotic documented, Cipro, Keflex,  Check with patient's of family what are antibiotics for, if needed, we may call in a prescription for her UTI

## 2015-02-10 NOTE — Telephone Encounter (Signed)
Spoke to patient - she has finished both antibiotics on her list (both given for UTIs).  Said she would appreciate you calling her in another medication to the pharmacy.  Verified her only allergies are Metformin and codeine.

## 2015-02-11 MED ORDER — AMOXICILLIN-POT CLAVULANATE 875-125 MG PO TABS: 1.0000 | ORAL_TABLET | Freq: Two times a day (BID) | ORAL | Status: DC

## 2015-02-11 NOTE — Telephone Encounter (Signed)
Please let patient know, I have called in Augmentin 875mg  po bid x 5 days to her pharmarcy

## 2015-02-11 NOTE — Telephone Encounter (Signed)
Patient aware that a new antibiotic has been sent to the pharmacy.

## 2015-02-12 ENCOUNTER — Telehealth: Payer: Self-pay

## 2015-02-12 NOTE — Telephone Encounter (Signed)
Patient had spoke to [facility] and will be here on 8/4/1/6.

## 2015-02-12 NOTE — Telephone Encounter (Signed)
No answer

## 2015-02-12 NOTE — Telephone Encounter (Signed)
Informed Pts Daughter that her  Mothers EEG showed no seizures and to follow up with Dr. [name], pt is schedule for 02/18/2015 with Dr. [name].

## 2015-02-12 NOTE — Telephone Encounter (Signed)
-----   Message from Hope Pigeon, RN sent at 02/11/2015  5:16 PM EDT -----   ----- Message -----    From: Melvenia Beam, MD    Sent: 02/10/2015   8:12 AM      To: Hope Pigeon, RN  Please let them know the eeg was abnormal but did not show seizures. Prefer them to discuss with Dr. [name] at next appoointment

## 2015-02-15 DIAGNOSIS — N390 Urinary tract infection, site not specified: Secondary | ICD-10-CM

## 2015-02-15 HISTORY — DX: Urinary tract infection, site not specified: N39.0

## 2015-02-18 ENCOUNTER — Telehealth: Payer: Self-pay | Admitting: Neurology

## 2015-02-18 ENCOUNTER — Ambulatory Visit (INDEPENDENT_AMBULATORY_CARE_PROVIDER_SITE_OTHER): Payer: Medicare Other | Admitting: Neurology

## 2015-02-18 ENCOUNTER — Encounter: Payer: Self-pay | Admitting: Neurology

## 2015-02-18 VITALS — Ht 63.0 in | Wt 187.0 lb

## 2015-02-18 DIAGNOSIS — F03918 Unspecified dementia, unspecified severity, with other behavioral disturbance: Secondary | ICD-10-CM

## 2015-02-18 DIAGNOSIS — G40909 Epilepsy, unspecified, not intractable, without status epilepticus: Secondary | ICD-10-CM

## 2015-02-18 DIAGNOSIS — R410 Disorientation, unspecified: Secondary | ICD-10-CM

## 2015-02-18 DIAGNOSIS — F05 Delirium due to known physiological condition: Secondary | ICD-10-CM | POA: Diagnosis not present

## 2015-02-18 DIAGNOSIS — I639 Cerebral infarction, unspecified: Secondary | ICD-10-CM

## 2015-02-18 DIAGNOSIS — F0391 Unspecified dementia with behavioral disturbance: Secondary | ICD-10-CM | POA: Diagnosis not present

## 2015-02-18 MED ORDER — QUETIAPINE FUMARATE 25 MG PO TABS: ORAL_TABLET | ORAL | Status: DC

## 2015-02-18 NOTE — Telephone Encounter (Signed)
Pt's daughter called and states that the Pharm will not fill Rx QUEtiapine (SEROQUEL) 25 MG tablet unless Dr. [name] calls to approve medication. Has something to do with insurance. Please call and advise [phone]

## 2015-02-18 NOTE — Telephone Encounter (Signed)
Ins has been contacted and provided with clinical info.  Request is currently under review.  Ref # [id number] I called back to advise. Got no answer.  Left message.

## 2015-02-18 NOTE — Progress Notes (Signed)
PATIENT: Martha Olsen DOB: [birth year]  Chief Complaint  Patient presents with  . Subacute confusional state    MMSE 18/30 - 8 animals.  She is here with her husband, Denyse Amass, and daughter, Lelon Frohlich.  Her family feels her confusion has not improved.  They would like to discuss her EEG results.     HISTORICAL Mrs. Fennimore is a 79 years old right-handed Caucasian female, accompanied by her husband, follow-up for seizure, gait difficulty   I saw her initially following her hospital discharge in March 2013,  She has past medical history of HTN, s/p CVA right basal ganglia 11/2001, dyslipidemia, DM, anxiety/depression, s/p appendectomy, s/p hysterectomy, DJD, s/p left hip replacement 03/2002, left knee replacement 01/2011, foot surgery and reverse left shoulder arthroplasty 11/2011.  In October 13 2011, she was at home, sitting at the rocking chair, was noted by her family fell over a chair, had generalized tonic-clonic seizure, patient has no warning signs, no collection of the event,  EMS was called, she was taken by ambulance to the hospital, she could not remember waking up in the hospital confused, she was put on Keppra 500 mg twice a day, tolerating it well, there was no recurrent seizure. There was mild elevated troponin, she was seen by cardiologist, will continue followup  EEG was normal. MRI of the brain has demonstrated old basal ganglion, and coronal radiata stroke, no acute lesions,  She complains of headache everyday, right temporal regions, light noise is bothersome,   She has quit driving since 2130, husband retired, she is active at home, house work  UPDATE July 18th 2014:  She is overall doing well, no recurrent seizure, tolerating medications, she has mild gait difficulty due to left shoulder, left hip replacement.  UPDATE Dec 29th 2015:  She has no recurrent seizures, tolerating Keppra 500 mg twice a day without significant side effect, she has increased gait difficulty  because of joints, knee pain, She has been complaining of a year history of worsening headaches, bilateral frontal, vortex region, she has to take ibuprofen 200 mg 2-3 tablets each time, sometimes two dosage each day  UPDATE Feb 2nd 2016: She was given prescription of tramadol 50 mg as needed since last visit December 2015, her headache has much improved, taking tramadol about twice each week, she continue has bilateral knee pain, low back pain, gait difficulty, she also has nocturia, frequent awakening, loss snoring, excessive daytime sleepiness, fatigue, sleep studies pending in August 25 2014  ESR, TSH was normal December 2015, mild elevated C-reactive protein 11.  UPDATE February 18 2015: She is accompanied by her husband, and daughter at today's clinical visit,  She had a urgent visit with Dr. Jaynee Eagles in February 09 2015 because of increased confusion, was found to have UTI, she was given prescription of Augmentin for 5 days, there was no significant change in her confusion  February 09 2015, EEG recording asymmetric slowing of the left hemisphere, with occasionally sharp transient  Patient's continue have slow worsening confusion, agitation, paranoid ideation, difficulty sleeping at nighttime, She had 10 years of education, worked two jobs most of her life, she retired at age 47, was very active until she had stroke in 2013, was noted to have declining functional status, increased memory trouble, especially since January 2016,  She also has worsening gait difficulty,worsening right knee pain, had a recent evaluation by orthopedic surgeon,she is not a candidate for right knee replacement anymore,because of worsening memory trouble, gradually increased confusion  She  also complains of low back pain,worsening bowel and bladder incontinence  REVIEW OF SYSTEMS: Full 14 system review of systems performed and notable only for as above  ALLERGIES: Allergies  Allergen Reactions  . Metformin And Related  Diarrhea  . Codeine Hives    HOME MEDICATIONS: Current Outpatient Prescriptions  Medication Sig Dispense Refill  . aspirin 81 MG chewable tablet Chew 1 tablet (81 mg total) by mouth daily. 30 tablet 2  . atorvastatin (LIPITOR) 40 MG tablet Take 40 mg by mouth daily.    . busPIRone (BUSPAR) 15 MG tablet Take 15 mg by mouth 3 (three) times daily.    . carvedilol (COREG) 6.25 MG tablet Take 1 tablet (6.25 mg total) by mouth 2 (two) times daily with a meal. 60 tablet 12  . gabapentin (NEURONTIN) 300 MG capsule Take 1 capsule (300 mg total) by mouth at bedtime. 30 capsule 5  . glimepiride (AMARYL) 2 MG tablet Take 2 mg by mouth daily before breakfast.    . ibuprofen (ADVIL,MOTRIN) 200 MG tablet Take 200 mg by mouth every 6 (six) hours as needed for mild pain.    Marland Kitchen levETIRAcetam (KEPPRA) 500 MG tablet Take 1 tablet (500 mg total) by mouth 2 (two) times daily. 60 tablet 11  . lisinopril-hydrochlorothiazide (PRINZIDE,ZESTORETIC) 20-12.5 MG per tablet Take 1 tablet by mouth daily.    . metFORMIN (GLUCOPHAGE) 1000 MG tablet Take 1,000 mg by mouth 2 (two) times daily with a meal.   1  . nitroGLYCERIN (NITROSTAT) 0.4 MG SL tablet Place 1 tablet (0.4 mg total) under the tongue every 5 (five) minutes as needed for chest pain. 25 tablet 3  . ONE TOUCH ULTRA TEST test strip 1 each by Other route 2 (two) times daily as needed (blood sugar).   0  . PARoxetine (PAXIL) 20 MG tablet Take 20 mg by mouth every morning.  0  . PROAIR HFA 108 (90 BASE) MCG/ACT inhaler Inhale 1-2 puffs into the lungs every 6 (six) hours as needed for wheezing or shortness of breath.     . ranitidine (ZANTAC) 150 MG tablet Take 150 mg by mouth 2 (two) times daily as needed for heartburn.    . solifenacin (VESICARE) 5 MG tablet Take 1 tablet (5 mg total) by mouth at bedtime. 30 tablet 6  . traMADol (ULTRAM) 50 MG tablet Take 1 tablet (50 mg total) by mouth every 6 (six) hours as needed. 30 tablet 3   No current facility-administered  medications for this visit.    PAST MEDICAL HISTORY: Past Medical History  Diagnosis Date  . Stroke 2002    right side weakness  . Hypertension   . Diabetes mellitus 2011  . Headache(784.0)   . Anxiety   . Depression   . Seizure   . History of echocardiogram     Echo 5/16: EF 55-60%, normal wall motion, grade 1 diastolic dysfunction    PAST SURGICAL HISTORY: Past Surgical History  Procedure Laterality Date  . Foot surgery    . Cholecystectomy  1993  . Tonsillectomy  1951  . Eye surgery  2009  . Abdominal hysterectomy  1980's  . Appendectomy  1980's  . Joint replacement  2012    Hip, Knee  . Reverse shoulder arthroplasty  12/07/2011    Procedure: REVERSE SHOULDER ARTHROPLASTY;  Surgeon: Marin Shutter, MD;  Location: [place];  Service: Orthopedics;  Laterality: Left;  left total reverse shoulder    FAMILY HISTORY: Family History  Problem Relation Age of  Onset  . Anesthesia problems Neg Hx   . CAD Father 31  . Stroke Mother 60  . Heart attack Father   . Hypertension Mother     SOCIAL HISTORY:  History   Social History  . Marital Status: Married    Spouse Name: Denyse Amass  . Number of Children: 4  . Years of Education: 9th   Occupational History  . Chartered certified accountant     Retired   Social History Main Topics  . Smoking status: Never Smoker   . Smokeless tobacco: Never Used  . Alcohol Use: No  . Drug Use: No  . Sexual Activity: Not on file   Other Topics Concern  . Not on file   Social History Narrative   Son recently died of cancer.  Lives at home with husband Thomes Cake) .  She has 3 children .    Education 10 th grade.   Right handed.   Caffeine use: 1 cup coffee/ day              PHYSICAL EXAM   Filed Vitals:   02/18/15 1506  Height: $Remove'5\' 3"'aXooyge$  (1.6 m)  Weight: 187 lb (84.823 kg)    Not recorded      Body mass index is 33.13 kg/(m^2).  PHYSICAL EXAMNIATION:  Gen: NAD, conversant, well nourised, obese, well groomed                       Cardiovascular: Regular rate rhythm, no peripheral edema, warm, nontender. Eyes: Conjunctivae clear without exudates or hemorrhage Neck: Supple, no carotid bruise. Pulmonary: Clear to auscultation bilaterally   NEUROLOGICAL EXAM:  MENTAL STATUS: Speech:    Speech is normal; fluent and spontaneous with normal comprehension.  Cognition:  Mini-Mental Status Examination is 18 out of 30, animal naming 8     Orientation: She is not oriented to date, missed 3 out of 3 recalls     Normal recent and remote memory   Attention span and concentration: She could not spell world backwards    She had normal naming, repeating,spontaneous speech, she has difficulty copy figures, and write sentence     Fund of knowledge   CRANIAL NERVES: CN II: Visual fields are full to confrontation. Fundoscopic exam is normal with sharp discs and no vascular changes. Pupils are round equal and briskly reactive to light. CN III, IV, VI: extraocular movement are normal. No ptosis. CN V: Facial sensation is intact to pinprick in all 3 divisions bilaterally. Corneal responses are intact.  CN VII: Face is symmetric with normal eye closure and smile. CN VIII: Hearing is normal to rubbing fingers CN IX, X: Palate elevates symmetrically. Phonation is normal. CN XI: Head turning and shoulder shrug are intact CN XII: Tongue is midline with normal movements and no atrophy.  MOTOR: He has mild bilateral ankle dorsiflexion weakness  REFLEXES: Hyporeflexia and symmetric   Sensory: Length dependent decreased light touch pinprick to distal leg   COORDINATION: Rapid alternating movements and fine finger movements are intact. There is no dysmetria on finger-to-nose and heel-knee-shin.    GAIT/STANCE: Posture is normal. Gait is steady with normal steps, base, arm swing, and turning. Heel and toe walking are normal. Tandem gait is normal.  Romberg is absent.   DIAGNOSTIC DATA (LABS, IMAGING, TESTING) - I reviewed patient  records, labs, notes, testing and imaging myself where available.   ASSESSMENT AND PLAN  YURANI FETTES is a 79 y.o. female  Dementia with behavior issues  Mini-Mental Status Examination is 18 out of 30 , animal naming 8  Central nervous system degenerative disorder, with vascular component  Add on seroquel titrating to 25 mg 2 tablets every night Seizure  Keep current dose of Keppra 500 mg twice a day Gait difficulty  Multifactorial, knee pain, likely a component of lumbar radiculopathy Bowel and bladder incontinence  She complains of significant low back pain, distal weakness, gait difficulty, consistent with lumbar stenosis      Marcial Pacas, M.D. Ph.D.  [facility] [address] [place], [place] [address] Ph: [phone] Fax: [phone]  CC: To referring provider

## 2015-02-19 ENCOUNTER — Other Ambulatory Visit (HOSPITAL_COMMUNITY): Payer: Medicare Other

## 2015-02-19 ENCOUNTER — Telehealth: Payer: Self-pay | Admitting: Neurology

## 2015-02-19 NOTE — Telephone Encounter (Signed)
Ins has approved the request for coverage on Seroquel effective until 02/18/2016 Ref # [id number].  They have notified the patient of this decision as well.

## 2015-02-19 NOTE — Telephone Encounter (Signed)
[employer] is calling regarding QUEtiapine (SEROQUEL) 25 MG tablet, approved, letter will be sent out to patient

## 2015-02-22 ENCOUNTER — Telehealth: Payer: Self-pay | Admitting: Neurology

## 2015-02-22 NOTE — Telephone Encounter (Signed)
Patient's husband calling starting pharmacy is still waiting for authorization from Dr. [name] regarding the Seroquel before they fill it.  Please give him a call at [phone].

## 2015-02-22 NOTE — Telephone Encounter (Signed)
Spoke to patient. Gave results and instructions. Pt verbalized understanding.  

## 2015-02-22 NOTE — Telephone Encounter (Signed)
-----   Message from Hope Pigeon, RN sent at 02/11/2015  5:16 PM EDT -----   ----- Message -----    From: Melvenia Beam, MD    Sent: 02/10/2015   8:12 AM      To: Hope Pigeon, RN  Please let them know the eeg was abnormal but did not show seizures. Prefer them to discuss with Dr. [name] at next appoointment

## 2015-02-22 NOTE — Telephone Encounter (Signed)
Called pharmacy - rx is ready for pick up - spoke to pt's husband and he is aware to pick up.

## 2015-02-23 ENCOUNTER — Telehealth: Payer: Self-pay | Admitting: Neurology

## 2015-02-23 NOTE — Telephone Encounter (Signed)
Dr. [name] just send in an Augmentin rx on 02/11/15.  This is the second complete round of antibiotics that the patient has completed.  Her daughter feels her mother may still have an infection.  Since she is has already failed other medications and is having continued problems, I told her it would be better for her to see her PCP.  [name] verbalized understanding and will take her mother in for an appt in the morning.

## 2015-02-23 NOTE — Telephone Encounter (Signed)
Patient's daughter is calling and asking if Dr. [name] will give her mother a Rx for her UTI. Please call.

## 2015-02-28 ENCOUNTER — Encounter (HOSPITAL_COMMUNITY): Payer: Self-pay

## 2015-02-28 ENCOUNTER — Emergency Department (HOSPITAL_COMMUNITY): Payer: Medicare Other

## 2015-02-28 ENCOUNTER — Inpatient Hospital Stay (HOSPITAL_COMMUNITY)
Admission: EM | Admit: 2015-02-28 | Discharge: 2015-03-02 | DRG: 871 | Disposition: A | Discharge status: Home Health | Payer: Medicare Other | Attending: Internal Medicine | Admitting: Internal Medicine

## 2015-02-28 DIAGNOSIS — G9340 Encephalopathy, unspecified: Secondary | ICD-10-CM | POA: Diagnosis present

## 2015-02-28 DIAGNOSIS — Z823 Family history of stroke: Secondary | ICD-10-CM | POA: Diagnosis not present

## 2015-02-28 DIAGNOSIS — Z8673 Personal history of transient ischemic attack (TIA), and cerebral infarction without residual deficits: Secondary | ICD-10-CM | POA: Diagnosis not present

## 2015-02-28 DIAGNOSIS — F329 Major depressive disorder, single episode, unspecified: Secondary | ICD-10-CM | POA: Diagnosis present

## 2015-02-28 DIAGNOSIS — Z96612 Presence of left artificial shoulder joint: Secondary | ICD-10-CM | POA: Diagnosis present

## 2015-02-28 DIAGNOSIS — Z885 Allergy status to narcotic agent status: Secondary | ICD-10-CM | POA: Diagnosis not present

## 2015-02-28 DIAGNOSIS — F32A Depression, unspecified: Secondary | ICD-10-CM | POA: Diagnosis present

## 2015-02-28 DIAGNOSIS — E785 Hyperlipidemia, unspecified: Secondary | ICD-10-CM | POA: Diagnosis present

## 2015-02-28 DIAGNOSIS — Z8249 Family history of ischemic heart disease and other diseases of the circulatory system: Secondary | ICD-10-CM

## 2015-02-28 DIAGNOSIS — E119 Type 2 diabetes mellitus without complications: Secondary | ICD-10-CM

## 2015-02-28 DIAGNOSIS — Z90710 Acquired absence of both cervix and uterus: Secondary | ICD-10-CM | POA: Diagnosis not present

## 2015-02-28 DIAGNOSIS — I10 Essential (primary) hypertension: Secondary | ICD-10-CM | POA: Diagnosis present

## 2015-02-28 DIAGNOSIS — F419 Anxiety disorder, unspecified: Secondary | ICD-10-CM | POA: Diagnosis present

## 2015-02-28 DIAGNOSIS — Z888 Allergy status to other drugs, medicaments and biological substances status: Secondary | ICD-10-CM | POA: Diagnosis not present

## 2015-02-28 DIAGNOSIS — N390 Urinary tract infection, site not specified: Secondary | ICD-10-CM

## 2015-02-28 DIAGNOSIS — S8001XA Contusion of right knee, initial encounter: Secondary | ICD-10-CM | POA: Diagnosis present

## 2015-02-28 DIAGNOSIS — Z96659 Presence of unspecified artificial knee joint: Secondary | ICD-10-CM | POA: Diagnosis present

## 2015-02-28 DIAGNOSIS — I2510 Atherosclerotic heart disease of native coronary artery without angina pectoris: Secondary | ICD-10-CM | POA: Diagnosis present

## 2015-02-28 DIAGNOSIS — Z7982 Long term (current) use of aspirin: Secondary | ICD-10-CM

## 2015-02-28 DIAGNOSIS — Z96649 Presence of unspecified artificial hip joint: Secondary | ICD-10-CM | POA: Diagnosis present

## 2015-02-28 DIAGNOSIS — W19XXXA Unspecified fall, initial encounter: Secondary | ICD-10-CM | POA: Diagnosis present

## 2015-02-28 DIAGNOSIS — F0391 Unspecified dementia with behavioral disturbance: Secondary | ICD-10-CM | POA: Diagnosis present

## 2015-02-28 DIAGNOSIS — I639 Cerebral infarction, unspecified: Secondary | ICD-10-CM | POA: Diagnosis present

## 2015-02-28 DIAGNOSIS — K219 Gastro-esophageal reflux disease without esophagitis: Secondary | ICD-10-CM | POA: Diagnosis present

## 2015-02-28 DIAGNOSIS — Z79899 Other long term (current) drug therapy: Secondary | ICD-10-CM | POA: Diagnosis not present

## 2015-02-28 DIAGNOSIS — Z87440 Personal history of urinary (tract) infections: Secondary | ICD-10-CM | POA: Diagnosis not present

## 2015-02-28 DIAGNOSIS — R569 Unspecified convulsions: Secondary | ICD-10-CM

## 2015-02-28 DIAGNOSIS — A419 Sepsis, unspecified organism: Principal | ICD-10-CM

## 2015-02-28 DIAGNOSIS — F0390 Unspecified dementia without behavioral disturbance: Secondary | ICD-10-CM | POA: Diagnosis present

## 2015-02-28 DIAGNOSIS — Z79891 Long term (current) use of opiate analgesic: Secondary | ICD-10-CM

## 2015-02-28 DIAGNOSIS — L8990 Pressure ulcer of unspecified site, unspecified stage: Secondary | ICD-10-CM | POA: Insufficient documentation

## 2015-02-28 DIAGNOSIS — Z9049 Acquired absence of other specified parts of digestive tract: Secondary | ICD-10-CM | POA: Diagnosis present

## 2015-02-28 HISTORY — DX: Unspecified dementia, unspecified severity, without behavioral disturbance, psychotic disturbance, mood disturbance, and anxiety: F03.90

## 2015-02-28 HISTORY — DX: Urinary tract infection, site not specified: N39.0

## 2015-02-28 LAB — CBC WITH DIFFERENTIAL/PLATELET
BASOS ABS: 0 10*3/uL (ref 0.0–0.1)
BASOS PCT: 0 % (ref 0–1)
EOS ABS: 0.1 10*3/uL (ref 0.0–0.7)
EOS PCT: 2 % (ref 0–5)
HEMATOCRIT: 33.8 % — AB (ref 36.0–46.0)
Hemoglobin: 10.9 g/dL — ABNORMAL LOW (ref 12.0–15.0)
LYMPHS ABS: 0.3 10*3/uL — AB (ref 0.7–4.0)
Lymphocytes Relative: 4 % — ABNORMAL LOW (ref 12–46)
MCH: 29.3 pg (ref 26.0–34.0)
MCHC: 32.2 g/dL (ref 30.0–36.0)
MCV: 90.9 fL (ref 78.0–100.0)
MONO ABS: 0.3 10*3/uL (ref 0.1–1.0)
Monocytes Relative: 6 % (ref 3–12)
NEUTROS ABS: 5.5 10*3/uL (ref 1.7–7.7)
NEUTROS PCT: 89 % — AB (ref 43–77)
PLATELETS: 180 10*3/uL (ref 150–400)
RBC: 3.72 MIL/uL — ABNORMAL LOW (ref 3.87–5.11)
RDW: 14.1 % (ref 11.5–15.5)
WBC: 6.2 10*3/uL (ref 4.0–10.5)

## 2015-02-28 LAB — URINALYSIS, ROUTINE W REFLEX MICROSCOPIC
Bilirubin Urine: NEGATIVE
Glucose, UA: NEGATIVE mg/dL
Hgb urine dipstick: NEGATIVE
KETONES UR: 15 mg/dL — AB
NITRITE: NEGATIVE
PH: 6.5 (ref 5.0–8.0)
Protein, ur: NEGATIVE mg/dL
Specific Gravity, Urine: 1.025 (ref 1.005–1.030)
UROBILINOGEN UA: 0.2 mg/dL (ref 0.0–1.0)

## 2015-02-28 LAB — URINE MICROSCOPIC-ADD ON

## 2015-02-28 LAB — BASIC METABOLIC PANEL
ANION GAP: 13 (ref 5–15)
BUN: 14 mg/dL (ref 6–20)
CHLORIDE: 105 mmol/L (ref 101–111)
CO2: 21 mmol/L — AB (ref 22–32)
CREATININE: 0.8 mg/dL (ref 0.44–1.00)
Calcium: 8.9 mg/dL (ref 8.9–10.3)
GFR calc Af Amer: >60 mL/min (ref >60)
GFR calc non Af Amer: >60 mL/min (ref >60)
Glucose, Bld: 154 mg/dL — ABNORMAL HIGH (ref 65–99)
POTASSIUM: 3.6 mmol/L (ref 3.5–5.1)
SODIUM: 139 mmol/L (ref 135–145)

## 2015-02-28 LAB — TROPONIN I: Troponin I: <0.03 ng/mL (ref <0.031)

## 2015-02-28 LAB — I-STAT CG4 LACTIC ACID, ED: Lactic Acid, Venous: 3.16 mmol/L (ref 0.5–2.0)

## 2015-02-28 MED ORDER — SODIUM CHLORIDE 0.9 % IV BOLUS (SEPSIS)
1000.0000 mL | Freq: Once | INTRAVENOUS | Status: AC
  Administered 2015-02-28: 1000 mL via INTRAVENOUS

## 2015-02-28 MED ORDER — SODIUM CHLORIDE 0.9 % IV BOLUS (SEPSIS)
1000.0000 mL | Freq: Once | INTRAVENOUS | Status: AC
  Administered 2015-03-01: 1000 mL via INTRAVENOUS

## 2015-02-28 MED ORDER — DEXTROSE 5 % IV SOLN: 1.0000 g | Freq: Once | INTRAVENOUS | Status: DC

## 2015-02-28 MED ORDER — DEXTROSE 5 % IV SOLN
1.0000 g | Freq: Once | INTRAVENOUS | Status: AC
  Administered 2015-02-28: 1 g via INTRAVENOUS
  Filled 2015-02-28: qty 10

## 2015-02-28 MED ORDER — ACETAMINOPHEN 325 MG PO TABS
650.0000 mg | ORAL_TABLET | Freq: Once | ORAL | Status: AC
  Administered 2015-02-28: 650 mg via ORAL
  Filled 2015-02-28: qty 2

## 2015-02-28 NOTE — ED Notes (Signed)
Per EMS: Pt from home, with UTI since June. Pt on abx at home. Pt has increasecd AMS from baseline dementia. States she fell tonight. Denies any pain. MD at bedside.

## 2015-02-28 NOTE — ED Provider Notes (Addendum)
CSN: [id number]     Arrival date & time 02/28/15  2024 History   First MD Initiated Contact with Patient 02/28/15 2025     Chief Complaint  Patient presents with  . Urinary Tract Infection  . Altered Mental Status      HPI  Patient presents for evaluation of confusion.  Patient is at home. Lives with her husband. Daughter and granddaughter live nearby. Patient apparently has had intermittent episodes of urinary tract infection since June. Has been on 4 different antibiotics". They think the most recent when she was on was "Augmentin". Family history "thinks" that she just finished one a few days ago.  She was less active today and felt weak. Family states she was not quite herself. Meeting she was less interactive and preferred to sit or lay. She has a bedside commode. She was attempting to transfer from her commode to her bed when she slipped from the bed onto the floor. She has pain in her knee and has some swelling on her knee. She was unable to stand because of one pain, and to weakness. He was transferred here.  Family is uncertain that she struck her head. Patient denies this but is somewhat confused upon arrival. Note from the ears nose or mouth. No dyspnea.  Past Medical History  Diagnosis Date  . Stroke 2002    right side weakness  . Hypertension   . Diabetes mellitus 2011  . Headache(784.0)   . Anxiety   . Depression   . Seizure   . History of echocardiogram     Echo 5/16: EF 55-60%, normal wall motion, grade 1 diastolic dysfunction   Past Surgical History  Procedure Laterality Date  . Foot surgery    . Cholecystectomy  1993  . Tonsillectomy  1951  . Eye surgery  2009  . Abdominal hysterectomy  1980's  . Appendectomy  1980's  . Joint replacement  2012    Hip, Knee  . Reverse shoulder arthroplasty  12/07/2011    Procedure: REVERSE SHOULDER ARTHROPLASTY;  Surgeon: Marin Shutter, MD;  Location: [place];  Service: Orthopedics;  Laterality: Left;  left total reverse  shoulder   Family History  Problem Relation Age of Onset  . Anesthesia problems Neg Hx   . CAD Father 37  . Stroke Mother 68  . Heart attack Father   . Hypertension Mother    Social History  Substance Use Topics  . Smoking status: Never Smoker   . Smokeless tobacco: Never Used  . Alcohol Use: No   OB History    No data available     Review of Systems  Unable to perform ROS: Mental status change      Allergies  Codeine and Metformin and related  Home Medications   Prior to Admission medications   Medication Sig Start Date End Date Taking? Authorizing Provider  aspirin 81 MG chewable tablet Chew 1 tablet (81 mg total) by mouth daily. 10/14/12  Yes Barton Dubois, MD  atorvastatin (LIPITOR) 40 MG tablet Take 40 mg by mouth daily.   Yes Historical Provider, MD  busPIRone (BUSPAR) 15 MG tablet Take 15 mg by mouth 3 (three) times daily.   Yes Historical Provider, MD  carvedilol (COREG) 6.25 MG tablet Take 1 tablet (6.25 mg total) by mouth 2 (two) times daily with a meal. 11/17/14  Yes Marcial Pacas, MD  gabapentin (NEURONTIN) 300 MG capsule Take 1 capsule (300 mg total) by mouth at bedtime. 09/03/14  Yes Richard A  Sater, MD  glimepiride (AMARYL) 2 MG tablet Take 2 mg by mouth daily before breakfast.   Yes Historical Provider, MD  ibuprofen (ADVIL,MOTRIN) 200 MG tablet Take 200 mg by mouth every 6 (six) hours as needed for mild pain.   Yes Historical Provider, MD  levETIRAcetam (KEPPRA) 500 MG tablet Take 1 tablet (500 mg total) by mouth 2 (two) times daily. 07/14/14  Yes Marcial Pacas, MD  lisinopril-hydrochlorothiazide (PRINZIDE,ZESTORETIC) 20-12.5 MG per tablet Take 1 tablet by mouth daily.   Yes Historical Provider, MD  metFORMIN (GLUCOPHAGE) 1000 MG tablet Take 1,000 mg by mouth 2 (two) times daily with a meal.  07/01/14  Yes Historical Provider, MD  nitroGLYCERIN (NITROSTAT) 0.4 MG SL tablet Place 1 tablet (0.4 mg total) under the tongue every 5 (five) minutes as needed for chest pain.  12/11/12  Yes Josue Hector, MD  PARoxetine (PAXIL) 20 MG tablet Take 20 mg by mouth every morning. 11/02/14  Yes Historical Provider, MD  PROAIR HFA 108 (90 BASE) MCG/ACT inhaler Inhale 1-2 puffs into the lungs every 6 (six) hours as needed for wheezing or shortness of breath.  10/04/12  Yes Historical Provider, MD  QUEtiapine (SEROQUEL) 25 MG tablet One po qhs xone week, then 2 tabs po qhs Patient taking differently: Take 25-50 mg by mouth at bedtime. One po qhs xone week, then 2 tabs po qhs 02/18/15  Yes Marcial Pacas, MD  ranitidine (ZANTAC) 150 MG tablet Take 150 mg by mouth 2 (two) times daily as needed for heartburn.   Yes Historical Provider, MD  solifenacin (VESICARE) 5 MG tablet Take 1 tablet (5 mg total) by mouth at bedtime. 08/18/14  Yes Marcial Pacas, MD  sulfamethoxazole-trimethoprim (BACTRIM DS,SEPTRA DS) 800-160 MG per tablet Take 1 tablet by mouth every 12 (twelve) hours. 02/27/15  Yes Historical Provider, MD  traMADol (ULTRAM) 50 MG tablet Take 1 tablet (50 mg total) by mouth every 6 (six) hours as needed. 07/14/14  Yes Marcial Pacas, MD   BP 106/89 mmHg  Pulse 86  Temp(Src) 101.3 F (38.5 C) (Rectal)  Resp 19  Ht 5\' 4"  (1.626 m)  Wt 200 lb (90.719 kg)  BMI 34.31 kg/m2  SpO2 98% Physical Exam  Constitutional: She is oriented to person, place, and time. She appears well-developed and well-nourished. No distress.  HENT:  Head: Normocephalic.  No signs of strike to the head. No periorbital ecchymosis. No blood over the TMs, mastoids, or from ears nose or mouth.  Eyes: Conjunctivae are normal. Pupils are equal, round, and reactive to light. No scleral icterus.  Neck: Normal range of motion. Neck supple. No thyromegaly present.  Cardiovascular: Normal rate and regular rhythm.  Exam reveals no gallop and no friction rub.   No murmur heard. Sinus rhythm. Not tachycardic. BP 120/45.  Pulmonary/Chest: Effort normal and breath sounds normal. No respiratory distress. She has no wheezes. She has no  rales.  Abdominal: Soft. Bowel sounds are normal. She exhibits no distension. There is no tenderness. There is no rebound.  Musculoskeletal: Normal range of motion.       Legs: Neurological: She is alert and oriented to person, place, and time.  Nonfocal. Moving all 4. Intact cranial nerves. Confused.  Skin: Skin is warm and dry. No rash noted.  Skin warm to the touch. Rectal temperature 101.5.  Psychiatric: She has a normal mood and affect. Her behavior is normal.    ED Course  Procedures (including critical care time) Labs Review Labs Reviewed  CBC WITH  DIFFERENTIAL/PLATELET - Abnormal; Notable for the following:    RBC 3.72 (*)    Hemoglobin 10.9 (*)    HCT 33.8 (*)    Neutrophils Relative % 89 (*)    Lymphocytes Relative 4 (*)    Lymphs Abs 0.3 (*)    All other components within normal limits  BASIC METABOLIC PANEL - Abnormal; Notable for the following:    CO2 21 (*)    Glucose, Bld 154 (*)    All other components within normal limits  URINALYSIS, ROUTINE W REFLEX MICROSCOPIC (NOT AT [facility]) - Abnormal; Notable for the following:    Ketones, ur 15 (*)    Leukocytes, UA SMALL (*)    All other components within normal limits  I-STAT CG4 LACTIC ACID, ED - Abnormal; Notable for the following:    Lactic Acid, Venous 3.16 (*)    All other components within normal limits  URINE CULTURE  CULTURE, BLOOD (ROUTINE X 2)  CULTURE, BLOOD (ROUTINE X 2)  TROPONIN I  URINE MICROSCOPIC-ADD ON    Imaging Review Dg Tibia/fibula Right  02/28/2015   CLINICAL DATA:  Bruising and swelling to the right anterior and lateral knee. Recent fall. Initial encounter.  EXAM: RIGHT TIBIA AND FIBULA - 2 VIEW  COMPARISON:  None.  FINDINGS: No evidence of acute fracture or dislocation.  Knee osteoarthritis, better evaluated on contemporaneous knee radiograph.  No radiopaque foreign body.  IMPRESSION: No acute osseous finding.   Electronically Signed   By: Monte Fantasia M.D.   On: 02/28/2015 21:23   Ct  Head Wo Contrast  02/28/2015   CLINICAL DATA:  Increasing altered mental status  EXAM: CT HEAD WITHOUT CONTRAST  TECHNIQUE: Contiguous axial images were obtained from the base of the skull through the vertex without intravenous contrast.  COMPARISON:  01/22/2015  FINDINGS: Bony calvarium is intact. Chronic right basal ganglia infarct with ischemic changes and deep centrum semi ovale on the right is noted. No acute hemorrhage, acute infarction or space-occupying mass lesion is noted.  IMPRESSION: Chronic ischemic changes similar to that noted on the prior exam. No acute abnormality is noted.   Electronically Signed   By: Inez Catalina M.D.   On: 02/28/2015 22:01   Dg Chest Port 1 View  02/28/2015   CLINICAL DATA:  Sepsis  EXAM: PORTABLE CHEST - 1 VIEW  COMPARISON:  01/22/2015  FINDINGS: Normal heart size and mediastinal contours. No definitive pneumonia, but coarsened markings noted at the left base. No edema, effusion, or pneumothorax.  Reverse left glenohumeral arthroplasty with stable surrounding mineralization.  IMPRESSION: 1. No acute finding. 2. If pneumonia is suspected clinically, recommend short follow-up to re-evaluate the left base.   Electronically Signed   By: Monte Fantasia M.D.   On: 02/28/2015 23:02   Dg Knee Complete 4 Views Right  02/28/2015   CLINICAL DATA:  Fall 1 week ago. Bruising and swelling to the anterior and lateral knee.  EXAM: RIGHT KNEE - COMPLETE 4+ VIEW  COMPARISON:  Right knee radiographs 11/08/2014.  FINDINGS: Moderate tricompartmental degenerative changes are present. Loose bodies are present in the knee. No acute fracture is present. The knee is located. There is no significant effusion. Prepatellar soft tissue swelling is present.  IMPRESSION: 1. Prepatellar soft tissue swelling without evidence for acute fracture. 2. Moderate degenerative changes are again noted in the knee. 3. No acute osseous abnormality.   Electronically Signed   By: San Morelle M.D.   On:  02/28/2015 21:27   I,  Madalee Altmann Clayton, personally reviewed and evaluated these images and lab results as part of my medical decision-making.   EKG Interpretation None      MDM   Final diagnoses:  UTI (lower urinary tract infection)  Sepsis, due to unspecified organism  Knee contusion, right, initial encounter    Initial irs criteria are risk for rate of 24, temperature 101.6. She is not hypoxemic. He is not tachycardic. She does not have hypotension.  Resource appears to be her urine. Chest x-ray appears normal. Lactate is elevated. Given 2000 mL IV fluid. Given IV Rocephin. Follow-up lactic acid pending.  Repeat she is much improved she is mentating well. Family states she was "much much better. Answering simple questions and interactive.  I have placed a call to unassigned medicine regarding admission.    Tanna Furry, MD 02/28/15 [id number]  Tanna Furry, MD 02/28/15 [phone]

## 2015-02-28 NOTE — H&P (Signed)
[facility] History and Physical  Martha Olsen IHK:[id number] DOB: March 23, 1936 DOA: 02/28/2015  Referring physician: ED physician PCP: Martha Coffin, MD  Specialists:   Chief Complaint: Altered mental status, dysuria  HPI: Martha Olsen is a 79 y.o. female with PMH of hypertension, hyperlipidemia, diabetes mellitus, GERD, depression, anxiety, stroke, seizure, dementia, CAD, multiple urinary tract infection in the past, who presents with altered mental status and dysuria.  Patient has AMS and dementia, is unable to provide much history. Therefore, most of the history is obtained by discussing the case with the ED physician and per her daughter. It seems that patient recently had several episodes of urinary tract infection in the past 2 months. She was treated with 4 rounds of antibitocs by her PCP. Last antibiotics was given on Friday after she had positive urinalysis (her daughter does not remember the name of antitiotics). Patient still has dysuria, burning on urination and increased urinary frequency. Yesterday patient was noted to be more confused and drowsy. She also has fall, and not sure whether she injured her head or not per her daughter. Pt does not have chest pain, shortness breath, cough, abdominal pain, diarrhea, unilateral weakness. Patient has weakness in both legs (I particularly confirmed with her daughter that patient has bilateral weakness in legs, no unilateral weakness).  In ED, patient was found to have elevated lactate 3.16, CBC 6.2, troponin negative, fever with temperature 101.3, no tachycardia, electrolytes okay. Chest x-ray is negative. CT head is negative. Urinalysis showed small amount of leukocytes.  Where does patient live?   At home    Can patient participate in ADLs? None   Review of Systems: per her daughter's report  General: no fevers, chills, no changes in body weight, has poor appetite, has fatigue HEENT: no blurry vision, hearing changes or sore  throat Pulm: no dyspnea, coughing, wheezing CV: no chest pain, palpitations Abd: no nausea, vomiting, abdominal pain, diarrhea, constipation GU: has dysuria, burning on urination, increased urinary frequency, no hematuria  Ext: no leg edema Neuro: no unilateral weakness, numbness, or tingling, no vision change or hearing loss. Has confusion. Skin: no rash MSK: No muscle spasm, no deformity, no limitation of range of movement in spin Heme: No easy bruising.  Travel history: No recent long distant travel.  Allergy:  Allergies  Allergen Reactions  . Codeine Hives  . Metformin And Related Diarrhea    Past Medical History  Diagnosis Date  . Stroke 2002    right side weakness  . Hypertension   . Diabetes mellitus 2011  . Headache(784.0)   . Anxiety   . Depression   . Seizure   . History of echocardiogram     Echo 5/16: EF 55-60%, normal wall motion, grade 1 diastolic dysfunction  . Dementia     Past Surgical History  Procedure Laterality Date  . Foot surgery    . Cholecystectomy  1993  . Tonsillectomy  1951  . Eye surgery  2009  . Abdominal hysterectomy  1980's  . Appendectomy  1980's  . Joint replacement  2012    Hip, Knee  . Reverse shoulder arthroplasty  12/07/2011    Procedure: REVERSE SHOULDER ARTHROPLASTY;  Surgeon: Marin Shutter, MD;  Location: [facility];  Service: Orthopedics;  Laterality: Left;  left total reverse shoulder    Social History:  reports that she has never smoked. She has never used smokeless tobacco. She reports that she does not drink alcohol or use illicit drugs.  Family History:  Family History  Problem Relation Age of Onset  . Anesthesia problems Neg Hx   . CAD Father 69  . Stroke Mother 77  . Heart attack Father   . Hypertension Mother      Prior to Admission medications   Medication Sig Start Date End Date Taking? Authorizing Provider  aspirin 81 MG chewable tablet Chew 1 tablet (81 mg total) by mouth daily. 10/14/12  Yes Barton Dubois,  MD  atorvastatin (LIPITOR) 40 MG tablet Take 40 mg by mouth daily.   Yes Historical Provider, MD  busPIRone (BUSPAR) 15 MG tablet Take 15 mg by mouth 3 (three) times daily.   Yes Historical Provider, MD  carvedilol (COREG) 6.25 MG tablet Take 1 tablet (6.25 mg total) by mouth 2 (two) times daily with a meal. 11/17/14  Yes Marcial Pacas, MD  gabapentin (NEURONTIN) 300 MG capsule Take 1 capsule (300 mg total) by mouth at bedtime. 09/03/14  Yes Britt Bottom, MD  glimepiride (AMARYL) 2 MG tablet Take 2 mg by mouth daily before breakfast.   Yes Historical Provider, MD  ibuprofen (ADVIL,MOTRIN) 200 MG tablet Take 200 mg by mouth every 6 (six) hours as needed for mild pain.   Yes Historical Provider, MD  levETIRAcetam (KEPPRA) 500 MG tablet Take 1 tablet (500 mg total) by mouth 2 (two) times daily. 07/14/14  Yes Marcial Pacas, MD  lisinopril-hydrochlorothiazide (PRINZIDE,ZESTORETIC) 20-12.5 MG per tablet Take 1 tablet by mouth daily.   Yes Historical Provider, MD  metFORMIN (GLUCOPHAGE) 1000 MG tablet Take 1,000 mg by mouth 2 (two) times daily with a meal.  07/01/14  Yes Historical Provider, MD  nitroGLYCERIN (NITROSTAT) 0.4 MG SL tablet Place 1 tablet (0.4 mg total) under the tongue every 5 (five) minutes as needed for chest pain. 12/11/12  Yes Josue Hector, MD  PARoxetine (PAXIL) 20 MG tablet Take 20 mg by mouth every morning. 11/02/14  Yes Historical Provider, MD  PROAIR HFA 108 (90 BASE) MCG/ACT inhaler Inhale 1-2 puffs into the lungs every 6 (six) hours as needed for wheezing or shortness of breath.  10/04/12  Yes Historical Provider, MD  QUEtiapine (SEROQUEL) 25 MG tablet One po qhs xone week, then 2 tabs po qhs Patient taking differently: Take 25-50 mg by mouth at bedtime. One po qhs xone week, then 2 tabs po qhs 02/18/15  Yes Marcial Pacas, MD  ranitidine (ZANTAC) 150 MG tablet Take 150 mg by mouth 2 (two) times daily as needed for heartburn.   Yes Historical Provider, MD  solifenacin (VESICARE) 5 MG tablet Take 1  tablet (5 mg total) by mouth at bedtime. 08/18/14  Yes Marcial Pacas, MD  sulfamethoxazole-trimethoprim (BACTRIM DS,SEPTRA DS) 800-160 MG per tablet Take 1 tablet by mouth every 12 (twelve) hours. 02/27/15  Yes Historical Provider, MD  traMADol (ULTRAM) 50 MG tablet Take 1 tablet (50 mg total) by mouth every 6 (six) hours as needed. 07/14/14  Yes Marcial Pacas, MD    Physical Exam: Filed Vitals:   03/01/15 0139 03/01/15 0204 03/01/15 0410 03/01/15 0443  BP:  122/45 174/48 163/53  Pulse:  84 101   Temp:  99.4 F (37.4 C) 98.8 F (37.1 C)   TempSrc:  Oral Oral   Resp:  20 18   Height:      Weight: 85.6 kg (188 lb 11.4 oz)     SpO2:  97% 100%    General: Not in acute distress HEENT:       Eyes: PERRL, EOMI, no scleral icterus.  ENT: No discharge from the ears and nose, no pharynx injection, no tonsillar enlargement.        Neck: No JVD, no bruit, no mass felt. Heme: No neck lymph node enlargement. Cardiac: S1/S2, RRR, No murmurs, No gallops or rubs. Pulm: No rales, wheezing, rhonchi or rubs. Abd: Soft, nondistended, nontender, no rebound pain, no organomegaly, BS present. Ext: No pitting leg edema bilaterally. 2+DP/PT pulse bilaterally. Musculoskeletal: No joint deformities, No joint redness or warmth, no limitation of ROM in spin. Skin: No rashes.  Neuro: drowsy, oriented to place, but not to time and person, cranial nerves II-XII grossly intact, moves all extremities. Psych: Cannot be assessed due to altered mental status.  Labs on Admission:  Basic Metabolic Panel:  Recent Labs Lab 02/28/15 2044  NA 139  K 3.6  CL 105  CO2 21*  GLUCOSE 154*  BUN 14  CREATININE 0.80  CALCIUM 8.9   Liver Function Tests: No results for input(s): AST, ALT, ALKPHOS, BILITOT, PROT, ALBUMIN in the last 168 hours. No results for input(s): LIPASE, AMYLASE in the last 168 hours. No results for input(s): AMMONIA in the last 168 hours. CBC:  Recent Labs Lab 02/28/15 2044  WBC 6.2  NEUTROABS  5.5  HGB 10.9*  HCT 33.8*  MCV 90.9  PLT 180   Cardiac Enzymes:  Recent Labs Lab 02/28/15 2044  TROPONINI <0.03    BNP (last 3 results) No results for input(s): BNP in the last 8760 hours.  ProBNP (last 3 results) No results for input(s): PROBNP in the last 8760 hours.  CBG: No results for input(s): GLUCAP in the last 168 hours.  Radiological Exams on Admission: Dg Tibia/fibula Right  02/28/2015   CLINICAL DATA:  Bruising and swelling to the right anterior and lateral knee. Recent fall. Initial encounter.  EXAM: RIGHT TIBIA AND FIBULA - 2 VIEW  COMPARISON:  None.  FINDINGS: No evidence of acute fracture or dislocation.  Knee osteoarthritis, better evaluated on contemporaneous knee radiograph.  No radiopaque foreign body.  IMPRESSION: No acute osseous finding.   Electronically Signed   By: Monte Fantasia M.D.   On: 02/28/2015 21:23   Ct Head Wo Contrast  02/28/2015   CLINICAL DATA:  Increasing altered mental status  EXAM: CT HEAD WITHOUT CONTRAST  TECHNIQUE: Contiguous axial images were obtained from the base of the skull through the vertex without intravenous contrast.  COMPARISON:  01/22/2015  FINDINGS: Bony calvarium is intact. Chronic right basal ganglia infarct with ischemic changes and deep centrum semi ovale on the right is noted. No acute hemorrhage, acute infarction or space-occupying mass lesion is noted.  IMPRESSION: Chronic ischemic changes similar to that noted on the prior exam. No acute abnormality is noted.   Electronically Signed   By: Inez Catalina M.D.   On: 02/28/2015 22:01   Dg Chest Port 1 View  02/28/2015   CLINICAL DATA:  Sepsis  EXAM: PORTABLE CHEST - 1 VIEW  COMPARISON:  01/22/2015  FINDINGS: Normal heart size and mediastinal contours. No definitive pneumonia, but coarsened markings noted at the left base. No edema, effusion, or pneumothorax.  Reverse left glenohumeral arthroplasty with stable surrounding mineralization.  IMPRESSION: 1. No acute finding. 2. If  pneumonia is suspected clinically, recommend short follow-up to re-evaluate the left base.   Electronically Signed   By: Monte Fantasia M.D.   On: 02/28/2015 23:02   Dg Knee Complete 4 Views Right  02/28/2015   CLINICAL DATA:  Fall 1 week ago. Bruising and swelling  to the anterior and lateral knee.  EXAM: RIGHT KNEE - COMPLETE 4+ VIEW  COMPARISON:  Right knee radiographs 11/08/2014.  FINDINGS: Moderate tricompartmental degenerative changes are present. Loose bodies are present in the knee. No acute fracture is present. The knee is located. There is no significant effusion. Prepatellar soft tissue swelling is present.  IMPRESSION: 1. Prepatellar soft tissue swelling without evidence for acute fracture. 2. Moderate degenerative changes are again noted in the knee. 3. No acute osseous abnormality.   Electronically Signed   By: San Morelle M.D.   On: 02/28/2015 21:27    EKG: Independently reviewed.  Abnormal findings: QTC 493, PAC   Assessment/Plan Principal Problem:   UTI (lower urinary tract infection) Active Problems:   DM (diabetes mellitus)   Dyslipidemia   Stroke   Hypertension   Depression   Seizure   CAD (coronary artery disease)   Essential hypertension   GERD (gastroesophageal reflux disease)   Sepsis   Acute encephalopathy   Fall   Dementia  UTI and sepsis: Patient has symptoms of UTI, urinalysis showed trace amount of leukocytes, consistent with UTI. She is a septic on admission with the elevated lactate and fever. Hemodynamically stable. -  Admit to telemetry - CeftriaxoneIV - Follow up results of urine and blood cx and amend antibiotic regimen if needed per sensitivity results - will get Procalcitonin and trend lactic acid levels per sepsis protocol. - IVF: 2.5 L of NS bolus in ED, followed by 100 cc/h  Acute encephalopathy: Most likely due to UTI and sepsis -Treat UTI as above -Frequent neuro checks -hold Darifenacin  DM-II: Last A1c 7.0 on 10/12/12, well  controled. Patient is taking Amaryl and metformin at home -SSI  HLD: Last LDL was 40 on 10/14/12 -Continue home medications: Lipitor  GERD: -Pepcid  Hx of stroke: -ASA  HTN: -hold Prinzide given sepsis and risk of developing hypotension -Coreg -IV hydralazine when necessary  Depression and anxiety: Stable, no suicidal or homicidal ideations. -Continue home medications: Seroquel, Paxil, BuSpar  Seizure: -continue oral Keppra -give one dose of IV keppra -check Keppra level  CAD (coronary artery disease): No chest pain. -ASA and metoprolol.    DVT ppx: SQ Heparin    Code Status: Full code Family Communication: None at bed side. Disposition Plan: Admit to inpatient   Date of Service 03/01/2015    Ivor Costa [facility] Pager [phone]  If 7PM-7AM, please contact night-coverage [email or web address] Password [email or web address] 03/01/2015, 5:07 AM

## 2015-03-01 ENCOUNTER — Encounter (HOSPITAL_COMMUNITY): Payer: Self-pay | Admitting: Internal Medicine

## 2015-03-01 ENCOUNTER — Telehealth: Payer: Self-pay | Admitting: Neurology

## 2015-03-01 DIAGNOSIS — I2510 Atherosclerotic heart disease of native coronary artery without angina pectoris: Secondary | ICD-10-CM

## 2015-03-01 DIAGNOSIS — W19XXXA Unspecified fall, initial encounter: Secondary | ICD-10-CM | POA: Diagnosis present

## 2015-03-01 DIAGNOSIS — N390 Urinary tract infection, site not specified: Secondary | ICD-10-CM

## 2015-03-01 DIAGNOSIS — F0390 Unspecified dementia without behavioral disturbance: Secondary | ICD-10-CM | POA: Diagnosis present

## 2015-03-01 DIAGNOSIS — G9340 Encephalopathy, unspecified: Secondary | ICD-10-CM

## 2015-03-01 DIAGNOSIS — IMO0001 Reserved for inherently not codable concepts without codable children: Secondary | ICD-10-CM | POA: Insufficient documentation

## 2015-03-01 DIAGNOSIS — L8990 Pressure ulcer of unspecified site, unspecified stage: Secondary | ICD-10-CM | POA: Insufficient documentation

## 2015-03-01 LAB — C DIFFICILE QUICK SCREEN W PCR REFLEX
C DIFFICILE (CDIFF) INTERP: NEGATIVE
C DIFFICILE (CDIFF) TOXIN: NEGATIVE
C Diff antigen: NEGATIVE

## 2015-03-01 LAB — COMPREHENSIVE METABOLIC PANEL
ALBUMIN: 3.3 g/dL — AB (ref 3.5–5.0)
ALT: 19 U/L (ref 14–54)
AST: 35 U/L (ref 15–41)
Alkaline Phosphatase: 54 U/L (ref 38–126)
Anion gap: 11 (ref 5–15)
BILIRUBIN TOTAL: 1 mg/dL (ref 0.3–1.2)
BUN: 11 mg/dL (ref 6–20)
CHLORIDE: 107 mmol/L (ref 101–111)
CO2: 23 mmol/L (ref 22–32)
Calcium: 8.3 mg/dL — ABNORMAL LOW (ref 8.9–10.3)
Creatinine, Ser: 0.64 mg/dL (ref 0.44–1.00)
GFR calc Af Amer: >60 mL/min (ref >60)
GFR calc non Af Amer: >60 mL/min (ref >60)
GLUCOSE: 123 mg/dL — AB (ref 65–99)
POTASSIUM: 3.5 mmol/L (ref 3.5–5.1)
Sodium: 141 mmol/L (ref 135–145)
Total Protein: 5.9 g/dL — ABNORMAL LOW (ref 6.5–8.1)

## 2015-03-01 LAB — CBC
HEMATOCRIT: 35.5 % — AB (ref 36.0–46.0)
Hemoglobin: 11 g/dL — ABNORMAL LOW (ref 12.0–15.0)
MCH: 28.5 pg (ref 26.0–34.0)
MCHC: 31 g/dL (ref 30.0–36.0)
MCV: 92 fL (ref 78.0–100.0)
Platelets: 144 10*3/uL — ABNORMAL LOW (ref 150–400)
RBC: 3.86 MIL/uL — ABNORMAL LOW (ref 3.87–5.11)
RDW: 14.6 % (ref 11.5–15.5)
WBC: 5.3 10*3/uL (ref 4.0–10.5)

## 2015-03-01 LAB — GLUCOSE, CAPILLARY
GLUCOSE-CAPILLARY: 163 mg/dL — AB (ref 65–99)
Glucose-Capillary: 123 mg/dL — ABNORMAL HIGH (ref 65–99)
Glucose-Capillary: 94 mg/dL (ref 65–99)
Glucose-Capillary: 104 mg/dL — ABNORMAL HIGH (ref 65–99)

## 2015-03-01 LAB — I-STAT CG4 LACTIC ACID, ED: LACTIC ACID, VENOUS: 2.09 mmol/L — AB (ref 0.5–2.0)

## 2015-03-01 MED ORDER — NITROGLYCERIN 0.4 MG SL SUBL: 0.4000 mg | SUBLINGUAL_TABLET | SUBLINGUAL | Status: DC | PRN

## 2015-03-01 MED ORDER — ACETAMINOPHEN 325 MG PO TABS
650.0000 mg | ORAL_TABLET | Freq: Four times a day (QID) | ORAL | Status: DC | PRN
  Administered 2015-03-01: 650 mg via ORAL
  Filled 2015-03-01 (×2): qty 2

## 2015-03-01 MED ORDER — DARIFENACIN HYDROBROMIDE ER 7.5 MG PO TB24
7.5000 mg | ORAL_TABLET | Freq: Every day | ORAL | Status: DC
  Filled 2015-03-01: qty 1

## 2015-03-01 MED ORDER — SODIUM CHLORIDE 0.9 % IV SOLN
500.0000 mg | Freq: Once | INTRAVENOUS | Status: AC
  Administered 2015-03-01: 500 mg via INTRAVENOUS
  Filled 2015-03-01: qty 5

## 2015-03-01 MED ORDER — GABAPENTIN 300 MG PO CAPS
300.0000 mg | ORAL_CAPSULE | Freq: Every day | ORAL | Status: DC
  Administered 2015-03-01: 300 mg via ORAL
  Filled 2015-03-01 (×3): qty 1

## 2015-03-01 MED ORDER — SODIUM CHLORIDE 0.9 % IV BOLUS (SEPSIS)
500.0000 mL | Freq: Once | INTRAVENOUS | Status: AC
  Administered 2015-03-01: 500 mL via INTRAVENOUS

## 2015-03-01 MED ORDER — HYDRALAZINE HCL 20 MG/ML IJ SOLN: 5.0000 mg | INTRAMUSCULAR | Status: DC | PRN

## 2015-03-01 MED ORDER — ACETAMINOPHEN 650 MG RE SUPP: 650.0000 mg | Freq: Four times a day (QID) | RECTAL | Status: DC | PRN

## 2015-03-01 MED ORDER — ATORVASTATIN CALCIUM 40 MG PO TABS
40.0000 mg | ORAL_TABLET | Freq: Every day | ORAL | Status: DC
  Administered 2015-03-01 – 2015-03-02 (×2): 40 mg via ORAL
  Filled 2015-03-01 (×2): qty 1

## 2015-03-01 MED ORDER — LORAZEPAM 2 MG/ML IJ SOLN
0.5000 mg | Freq: Once | INTRAMUSCULAR | Status: AC
  Administered 2015-03-01: 0.5 mg via INTRAVENOUS

## 2015-03-01 MED ORDER — IBUPROFEN 200 MG PO TABS
200.0000 mg | ORAL_TABLET | Freq: Four times a day (QID) | ORAL | Status: DC | PRN
  Administered 2015-03-02: 200 mg via ORAL
  Filled 2015-03-01: qty 1

## 2015-03-01 MED ORDER — LORAZEPAM 2 MG/ML IJ SOLN
INTRAMUSCULAR | Status: AC
  Filled 2015-03-01: qty 1

## 2015-03-01 MED ORDER — ASPIRIN 81 MG PO CHEW
81.0000 mg | CHEWABLE_TABLET | Freq: Every day | ORAL | Status: DC
  Administered 2015-03-01 – 2015-03-02 (×2): 81 mg via ORAL
  Filled 2015-03-01 (×2): qty 1

## 2015-03-01 MED ORDER — INSULIN ASPART 100 UNIT/ML ~~LOC~~ SOLN: 0.0000 [IU] | Freq: Every day | SUBCUTANEOUS | Status: DC

## 2015-03-01 MED ORDER — TRAMADOL HCL 50 MG PO TABS
50.0000 mg | ORAL_TABLET | Freq: Four times a day (QID) | ORAL | Status: DC | PRN
  Administered 2015-03-01 – 2015-03-02 (×2): 50 mg via ORAL
  Filled 2015-03-01 (×2): qty 1

## 2015-03-01 MED ORDER — INSULIN ASPART 100 UNIT/ML ~~LOC~~ SOLN
0.0000 [IU] | Freq: Three times a day (TID) | SUBCUTANEOUS | Status: DC
  Administered 2015-03-01: 2 [IU] via SUBCUTANEOUS
  Administered 2015-03-01: 1 [IU] via SUBCUTANEOUS
  Administered 2015-03-02: 2 [IU] via SUBCUTANEOUS

## 2015-03-01 MED ORDER — ALBUTEROL SULFATE (2.5 MG/3ML) 0.083% IN NEBU: 3.0000 mL | INHALATION_SOLUTION | Freq: Four times a day (QID) | RESPIRATORY_TRACT | Status: DC | PRN

## 2015-03-01 MED ORDER — HEPARIN SODIUM (PORCINE) 5000 UNIT/ML IJ SOLN
5000.0000 [IU] | Freq: Three times a day (TID) | INTRAMUSCULAR | Status: DC
  Administered 2015-03-01: 5000 [IU] via SUBCUTANEOUS
  Filled 2015-03-01 (×4): qty 1

## 2015-03-01 MED ORDER — PAROXETINE HCL 20 MG PO TABS
20.0000 mg | ORAL_TABLET | Freq: Every morning | ORAL | Status: DC
  Administered 2015-03-01 – 2015-03-02 (×2): 20 mg via ORAL
  Filled 2015-03-01 (×2): qty 1

## 2015-03-01 MED ORDER — SODIUM CHLORIDE 0.9 % IJ SOLN
3.0000 mL | Freq: Two times a day (BID) | INTRAMUSCULAR | Status: DC
  Administered 2015-03-01 – 2015-03-02 (×4): 3 mL via INTRAVENOUS

## 2015-03-01 MED ORDER — BUSPIRONE HCL 15 MG PO TABS
15.0000 mg | ORAL_TABLET | Freq: Three times a day (TID) | ORAL | Status: DC
  Administered 2015-03-01 – 2015-03-02 (×4): 15 mg via ORAL
  Filled 2015-03-01 (×7): qty 1

## 2015-03-01 MED ORDER — HALOPERIDOL LACTATE 5 MG/ML IJ SOLN
1.0000 mg | Freq: Four times a day (QID) | INTRAMUSCULAR | Status: DC | PRN
  Administered 2015-03-01: 1 mg via INTRAVENOUS
  Filled 2015-03-01 (×2): qty 0.2

## 2015-03-01 MED ORDER — SODIUM CHLORIDE 0.9 % IV SOLN
INTRAVENOUS | Status: DC
  Administered 2015-03-01 (×3): via INTRAVENOUS

## 2015-03-01 MED ORDER — FAMOTIDINE 20 MG PO TABS
20.0000 mg | ORAL_TABLET | Freq: Every day | ORAL | Status: DC
  Administered 2015-03-01 – 2015-03-02 (×2): 20 mg via ORAL
  Filled 2015-03-01 (×2): qty 1

## 2015-03-01 MED ORDER — LEVETIRACETAM 500 MG PO TABS
500.0000 mg | ORAL_TABLET | Freq: Two times a day (BID) | ORAL | Status: DC
  Filled 2015-03-01: qty 1

## 2015-03-01 MED ORDER — LEVETIRACETAM 500 MG PO TABS
500.0000 mg | ORAL_TABLET | Freq: Two times a day (BID) | ORAL | Status: DC
  Administered 2015-03-01 – 2015-03-02 (×3): 500 mg via ORAL
  Filled 2015-03-01 (×4): qty 1

## 2015-03-01 MED ORDER — CARVEDILOL 6.25 MG PO TABS
6.2500 mg | ORAL_TABLET | Freq: Two times a day (BID) | ORAL | Status: DC
  Administered 2015-03-01 – 2015-03-02 (×3): 6.25 mg via ORAL
  Filled 2015-03-01 (×5): qty 1

## 2015-03-01 MED ORDER — QUETIAPINE FUMARATE 25 MG PO TABS
50.0000 mg | ORAL_TABLET | Freq: Every day | ORAL | Status: DC
  Administered 2015-03-01: 50 mg via ORAL
  Filled 2015-03-01 (×3): qty 2

## 2015-03-01 MED ORDER — DEXTROSE 5 % IV SOLN
1.0000 g | INTRAVENOUS | Status: DC
  Administered 2015-03-01: 1 g via INTRAVENOUS
  Filled 2015-03-01 (×2): qty 10

## 2015-03-01 MED ORDER — ENOXAPARIN SODIUM 40 MG/0.4ML ~~LOC~~ SOLN
40.0000 mg | SUBCUTANEOUS | Status: DC
  Administered 2015-03-02: 40 mg via SUBCUTANEOUS
  Filled 2015-03-01: qty 0.4

## 2015-03-01 NOTE — Progress Notes (Signed)
OT Cancellation Note  Patient Details Name: Martha Olsen MRN: [id number] DOB: 1935-09-26   Cancelled Treatment:    Reason Eval/Treat Not Completed: Other (comment) Pt sleeping and daughter requesting OT to not wake her. Will plan to evaluate tomorrow.  Benito Mccreedy OTR/L [phone] 03/01/2015, 3:53 PM

## 2015-03-01 NOTE — Telephone Encounter (Signed)
Pts daughter called and wanted Dr. [name] to know that her mother was admitted to the hospital last night. She is concerned about her medication and would like a call back. Please call and advise [phone]

## 2015-03-01 NOTE — Progress Notes (Signed)
Notified Rogue Bussing with [facility] to notify of incontinence approx q15 min and red, excoriated bottom. Pt AMS and very unsteady on her feet. Pt trying to get out of bed. Daughter is at bedside and stated they will have someone q24 hours. Rogue Bussing ordered foley catheter.   Will continue to monitor.   Earlie Lou

## 2015-03-01 NOTE — Evaluation (Signed)
Physical Therapy Evaluation Patient Details Name: Martha Olsen MRN: [id number] DOB: [birth year] Today's Date: 03/01/2015   History of Present Illness  Patient is a 79 y.o. female with PMH of hypertension, hyperlipidemia, diabetes mellitus, GERD, depression, anxiety, stroke, seizure, dementia, CAD, multiple urinary tract infection in the past, who presents with altered mental status and dysuria.  Clinical Impression  Patient presents with decreased independence and safety with mobility due to deficits listed in PT problem list.  Though feel she is at risk for continued falls at home, spouse wishes her to return home due to bad experience in SNF rehab before and feels with more help he can manage her at home.  Agree likely her mental status is better in her home environment, so will recommend max services possible at home with spouse/daughters to assist.    Follow Up Recommendations Home health PT;Supervision/Assistance - 24 hour [facility] aide)    Equipment Recommendations  Wheelchair cushion (measurements PT);Wheelchair (measurements PT)    Recommendations for Other Services       Precautions / Restrictions Precautions Precautions: Fall Precaution Comments: has fallen at least 10 times in past month per spouse      Mobility  Bed Mobility Overal bed mobility: Needs Assistance Bed Mobility: Supine to Sit     Supine to sit: Mod assist     General bed mobility comments: assist for lifting trunk as pt fell back on bed attempting to get up unaided  Transfers Overall transfer level: Needs assistance Equipment used: Rolling walker (2 wheeled) Transfers: Sit to/from Stand Sit to Stand: Min assist;Mod assist         General transfer comment: increased assist needed from toilet in bathroom and max cues maneuvering to side step into bathroom  Ambulation/Gait Ambulation/Gait assistance: Min assist Ambulation Distance (Feet): 20 Feet (x 2) Assistive device: Rolling walker (2  wheeled) Gait Pattern/deviations: Step-to pattern;Decreased stride length;Wide base of support;Decreased weight shift to left;Shuffle     General Gait Details: heavy UE reilance with walker, difficulty in small spaces and around obstacles.  patient fatigued, but then eager to get back up again after returning to supine  Stairs            Wheelchair Mobility    Modified Rankin (Stroke Patients Only)       Balance Overall balance assessment: Needs assistance Sitting-balance support: Feet supported Sitting balance-Leahy Scale: Poor Sitting balance - Comments: leaning back Postural control: Posterior lean Standing balance support: Bilateral upper extremity supported Standing balance-Leahy Scale: Poor Standing balance comment: heavy UE reliance on walker                             Pertinent Vitals/Pain Pain Assessment: Faces Faces Pain Scale: Hurts little more Pain Location: right knee Pain Descriptors / Indicators: Discomfort Pain Intervention(s): Monitored during session    Home Living Family/patient expects to be discharged to:: Private residence Living Arrangements: Spouse/significant other;Non-relatives/Friends Available Help at Discharge: Family;Available 24 hours/day Type of Home: House         Home Equipment: Walker - 2 wheels      Prior Function Level of Independence: Needs assistance   Gait / Transfers Assistance Needed: needed assist from spouse wtih walker and still fell frequently     Comments: spouse visiting her in hospital transport chair himself     Hand Dominance        Extremity/Trunk Assessment  Lower Extremity Assessment: RLE deficits/detail;LLE deficits/detail RLE Deficits / Details: lifts both legs antigravity and bends knees independently, right knee with bruising and edema both prepatellar and post patellar, LLE Deficits / Details: keeps left leg abducted during gait and has ankle foot  supination  Cervical / Trunk Assessment: Kyphotic  Communication      Cognition Arousal/Alertness: Awake/alert Behavior During Therapy: Restless;Agitated Overall Cognitive Status: Impaired/Different from baseline Area of Impairment: Safety/judgement         Safety/Judgement: Decreased awareness of deficits;Decreased awareness of safety     General Comments: spouse reports more agitated than at home due to not being able to get up    General Comments      Exercises        Assessment/Plan    PT Assessment Patient needs continued PT services  PT Diagnosis Abnormality of gait;Generalized weakness   PT Problem List Decreased strength;Decreased balance;Decreased knowledge of precautions;Decreased knowledge of use of DME;Decreased safety awareness;Pain;Decreased mobility;Decreased activity tolerance;Decreased cognition  PT Treatment Interventions DME instruction;Functional mobility training;Balance training;Patient/family education;Gait training;Therapeutic activities;Neuromuscular re-education;Therapeutic exercise;Cognitive remediation   PT Goals (Current goals can be found in the Care Plan section) Acute Rehab PT Goals Patient Stated Goal: Per spouse to go home PT Goal Formulation: With patient/family Time For Goal Achievement: 03/15/15 Potential to Achieve Goals: Fair    Frequency Min 3X/week   Barriers to discharge        Co-evaluation               End of Session Equipment Utilized During Treatment: Gait belt Activity Tolerance: Patient tolerated treatment well Patient left: in bed;with nursing/sitter in room;with family/visitor present           Time: 2500-3704 PT Time Calculation (min) (ACUTE ONLY): 37 min   Charges:   PT Evaluation $Initial PT Evaluation Tier I: 1 Procedure PT Treatments $Gait Training: 8-22 mins   PT G Codes:        Martha Olsen,CYNDI March 21, 2015, 3:49 PM Magda Kiel, [place] 03/21/2015

## 2015-03-01 NOTE — Telephone Encounter (Signed)
Spoke to patient's daughter - she is in the hospital being treated for sepsis.  Dr. [name] aware.

## 2015-03-01 NOTE — Progress Notes (Addendum)
[facility] PROGRESS NOTE  Martha Olsen:[id number] DOB: 29-Oct-1935 DOA: 02/28/2015 PCP: Donnie Coffin, MD  Assessment/Plan: UTI and sepsis:  - fever, abnormal UA, has been on 2-3 Abxs recently - FU urine Cx, may be negative since partially treated -continue CeftriaxoneIV -FU blood Cx -cut down IVF, FU lactic acid  Acute encephalopathy: Most likely due to UTI and sepsis, underlying dementia -has behavioral problems with her dementia at baseline -continue Abx, seroquel -held Solifenacin -mentation mostly stable now, resume diet pending speech eval -haldol PRN for severe agitation  Seizure: -continue oral Keppra  DM-II: Last A1c 7.0 on 10/12/12, well controled -on Amaryl and metformin at home -SSI  HLD: Last LDL was 40 on 10/14/12 -Continue home medications: Lipitor  GERD: -Pepcid  Hx of stroke: -ASA  HTN: -hold Prinzide given sepsis and risk of developing hypotension -Coreg -IV hydralazine when necessary  Depression and anxiety: Stable, no suicidal or homicidal ideations. -Continue home medications: Seroquel, Paxil, BuSpar  CAD (coronary artery disease): No chest pain. -ASA and metoprolol.  DVT ppx: SQ Heparin, monitor platelets  Code Status: Full Code Family Communication: daughter at bedside Disposition Plan: Home when improved   HPI/Subjective: feels  Objective: Filed Vitals:   03/01/15 0443  BP: 163/53  Pulse:   Temp:   Resp:     Intake/Output Summary (Last 24 hours) at 03/01/15 1207 Last data filed at 03/01/15 0800  Gross per 24 hour  Intake      0 ml  Output      0 ml  Net      0 ml   Filed Weights   02/28/15 2034 03/01/15 0139  Weight: 90.719 kg (200 lb) 85.6 kg (188 lb 11.4 oz)    Exam:   General:  AAOx to self and place only  Cardiovascular: S1S2/RRR  Respiratory: CTAB  Abdomen: soft, Nt, BS present  Musculoskeletal: no edema c/c  Data Reviewed: Basic Metabolic Panel:  Recent Labs Lab 02/28/15 2044  03/01/15 0450  NA 139 141  K 3.6 3.5  CL 105 107  CO2 21* 23  GLUCOSE 154* 123*  BUN 14 11  CREATININE 0.80 0.64  CALCIUM 8.9 8.3*   Liver Function Tests:  Recent Labs Lab 03/01/15 0450  AST 35  ALT 19  ALKPHOS 54  BILITOT 1.0  PROT 5.9*  ALBUMIN 3.3*   No results for input(s): LIPASE, AMYLASE in the last 168 hours. No results for input(s): AMMONIA in the last 168 hours. CBC:  Recent Labs Lab 02/28/15 2044 03/01/15 0450  WBC 6.2 5.3  NEUTROABS 5.5  --   HGB 10.9* 11.0*  HCT 33.8* 35.5*  MCV 90.9 92.0  PLT 180 144*   Cardiac Enzymes:  Recent Labs Lab 02/28/15 2044  TROPONINI <0.03   BNP (last 3 results) No results for input(s): BNP in the last 8760 hours.  ProBNP (last 3 results) No results for input(s): PROBNP in the last 8760 hours.  CBG:  Recent Labs Lab 03/01/15 0556 03/01/15 1137  GLUCAP 163* 123*    Recent Results (from the past 240 hour(s))  Urine culture     Status: None (Preliminary result)   Collection Time: 02/28/15  8:58 PM  Result Value Ref Range Status   Specimen Description URINE, CATHETERIZED  Final   Special Requests NONE  Final   Culture NO GROWTH < 12 HOURS  Final   Report Status PENDING  Incomplete     Studies: Dg Tibia/fibula Right  02/28/2015   CLINICAL DATA:  Bruising  and swelling to the right anterior and lateral knee. Recent fall. Initial encounter.  EXAM: RIGHT TIBIA AND FIBULA - 2 VIEW  COMPARISON:  None.  FINDINGS: No evidence of acute fracture or dislocation.  Knee osteoarthritis, better evaluated on contemporaneous knee radiograph.  No radiopaque foreign body.  IMPRESSION: No acute osseous finding.   Electronically Signed   By: Monte Fantasia M.D.   On: 02/28/2015 21:23   Ct Head Wo Contrast  02/28/2015   CLINICAL DATA:  Increasing altered mental status  EXAM: CT HEAD WITHOUT CONTRAST  TECHNIQUE: Contiguous axial images were obtained from the base of the skull through the vertex without intravenous contrast.   COMPARISON:  01/22/2015  FINDINGS: Bony calvarium is intact. Chronic right basal ganglia infarct with ischemic changes and deep centrum semi ovale on the right is noted. No acute hemorrhage, acute infarction or space-occupying mass lesion is noted.  IMPRESSION: Chronic ischemic changes similar to that noted on the prior exam. No acute abnormality is noted.   Electronically Signed   By: Inez Catalina M.D.   On: 02/28/2015 22:01   Dg Chest Port 1 View  02/28/2015   CLINICAL DATA:  Sepsis  EXAM: PORTABLE CHEST - 1 VIEW  COMPARISON:  01/22/2015  FINDINGS: Normal heart size and mediastinal contours. No definitive pneumonia, but coarsened markings noted at the left base. No edema, effusion, or pneumothorax.  Reverse left glenohumeral arthroplasty with stable surrounding mineralization.  IMPRESSION: 1. No acute finding. 2. If pneumonia is suspected clinically, recommend short follow-up to re-evaluate the left base.   Electronically Signed   By: Monte Fantasia M.D.   On: 02/28/2015 23:02   Dg Knee Complete 4 Views Right  02/28/2015   CLINICAL DATA:  Fall 1 week ago. Bruising and swelling to the anterior and lateral knee.  EXAM: RIGHT KNEE - COMPLETE 4+ VIEW  COMPARISON:  Right knee radiographs 11/08/2014.  FINDINGS: Moderate tricompartmental degenerative changes are present. Loose bodies are present in the knee. No acute fracture is present. The knee is located. There is no significant effusion. Prepatellar soft tissue swelling is present.  IMPRESSION: 1. Prepatellar soft tissue swelling without evidence for acute fracture. 2. Moderate degenerative changes are again noted in the knee. 3. No acute osseous abnormality.   Electronically Signed   By: San Morelle M.D.   On: 02/28/2015 21:27    Scheduled Meds: . aspirin  81 mg Oral Daily  . atorvastatin  40 mg Oral Daily  . busPIRone  15 mg Oral TID  . carvedilol  6.25 mg Oral BID WC  . cefTRIAXone (ROCEPHIN)  IV  1 g Intravenous Q24H  . famotidine  20 mg  Oral Daily  . gabapentin  300 mg Oral QHS  . heparin  5,000 Units Subcutaneous 3 times per day  . insulin aspart  0-5 Units Subcutaneous QHS  . insulin aspart  0-9 Units Subcutaneous TID WC  . levETIRAcetam  500 mg Oral BID  . PARoxetine  20 mg Oral q morning - 10a  . QUEtiapine  50 mg Oral QHS  . sodium chloride  3 mL Intravenous Q12H   Continuous Infusions: . sodium chloride 100 mL/hr at 03/01/15 0941   Antibiotics Given (last 72 hours)    None      Principal Problem:   UTI (lower urinary tract infection) Active Problems:   DM (diabetes mellitus)   Dyslipidemia   Stroke   Hypertension   Depression   Seizure   CAD (coronary artery disease)  Essential hypertension   GERD (gastroesophageal reflux disease)   Sepsis   Acute encephalopathy   Fall   Dementia   Pressure ulcer    Time spent: 59min    Shreyas Piatkowski  [facility] Pager [phone]. If 7PM-7AM, please contact night-coverage at [email or web address], password [facility] 03/01/2015, 12:07 PM  LOS: 1 day

## 2015-03-01 NOTE — Progress Notes (Signed)
Pt increasingly confused this morning, combative, trying to get out of bed.  Daughter at bedside.  Rogue Bussing notified.  IV ativan ordered and given.  Will cont to monitor pt.  Claudette Stapler, RN

## 2015-03-01 NOTE — Progress Notes (Signed)
Utilization review completed.  

## 2015-03-01 NOTE — ED Notes (Signed)
All belongings sent up to floor with family member and pt

## 2015-03-02 ENCOUNTER — Telehealth: Payer: Self-pay | Admitting: Neurology

## 2015-03-02 ENCOUNTER — Encounter (HOSPITAL_COMMUNITY): Payer: Self-pay | Admitting: General Practice

## 2015-03-02 ENCOUNTER — Other Ambulatory Visit: Payer: Self-pay | Admitting: Neurology

## 2015-03-02 DIAGNOSIS — F0391 Unspecified dementia with behavioral disturbance: Secondary | ICD-10-CM

## 2015-03-02 LAB — COMPREHENSIVE METABOLIC PANEL
ALBUMIN: 2.8 g/dL — AB (ref 3.5–5.0)
ALK PHOS: 54 U/L (ref 38–126)
ALT: 22 U/L (ref 14–54)
AST: 42 U/L — AB (ref 15–41)
Anion gap: 8 (ref 5–15)
BUN: 5 mg/dL — AB (ref 6–20)
CALCIUM: 7.9 mg/dL — AB (ref 8.9–10.3)
CHLORIDE: 106 mmol/L (ref 101–111)
CO2: 24 mmol/L (ref 22–32)
CREATININE: 0.56 mg/dL (ref 0.44–1.00)
GFR calc non Af Amer: >60 mL/min (ref >60)
GLUCOSE: 110 mg/dL — AB (ref 65–99)
Potassium: 3.1 mmol/L — ABNORMAL LOW (ref 3.5–5.1)
SODIUM: 138 mmol/L (ref 135–145)
Total Bilirubin: 1.1 mg/dL (ref 0.3–1.2)
Total Protein: 5.3 g/dL — ABNORMAL LOW (ref 6.5–8.1)

## 2015-03-02 LAB — GLUCOSE, CAPILLARY
GLUCOSE-CAPILLARY: 144 mg/dL — AB (ref 65–99)
GLUCOSE-CAPILLARY: 156 mg/dL — AB (ref 65–99)
Glucose-Capillary: 110 mg/dL — ABNORMAL HIGH (ref 65–99)

## 2015-03-02 LAB — URINE CULTURE: CULTURE: NO GROWTH

## 2015-03-02 LAB — CBC
HCT: 29.7 % — ABNORMAL LOW (ref 36.0–46.0)
HEMOGLOBIN: 9.4 g/dL — AB (ref 12.0–15.0)
MCH: 28.7 pg (ref 26.0–34.0)
MCHC: 31.6 g/dL (ref 30.0–36.0)
MCV: 90.8 fL (ref 78.0–100.0)
PLATELETS: 146 10*3/uL — AB (ref 150–400)
RBC: 3.27 MIL/uL — AB (ref 3.87–5.11)
RDW: 14.8 % (ref 11.5–15.5)
WBC: 4 10*3/uL (ref 4.0–10.5)

## 2015-03-02 LAB — LACTIC ACID, PLASMA: LACTIC ACID, VENOUS: 1.7 mmol/L (ref 0.5–2.0)

## 2015-03-02 LAB — LEVETIRACETAM LEVEL: LEVETIRACETAM: 21.1 ug/mL (ref 10.0–40.0)

## 2015-03-02 MED ORDER — GABAPENTIN 300 MG PO CAPS: 300.0000 mg | ORAL_CAPSULE | Freq: Every day | ORAL | Status: DC

## 2015-03-02 MED ORDER — POTASSIUM CHLORIDE CRYS ER 20 MEQ PO TBCR
40.0000 meq | EXTENDED_RELEASE_TABLET | Freq: Once | ORAL | Status: AC
  Administered 2015-03-02: 40 meq via ORAL
  Filled 2015-03-02: qty 2

## 2015-03-02 MED ORDER — CEFPODOXIME PROXETIL 100 MG PO TABS: 100.0000 mg | ORAL_TABLET | Freq: Two times a day (BID) | ORAL | Status: DC

## 2015-03-02 NOTE — Progress Notes (Signed)
Physical Therapy Treatment Patient Details Name: Martha Olsen MRN: [id number] DOB: 07/09/1936 Today's Date: 03/02/2015    History of Present Illness Patient is a 79 y.o. female with PMH of hypertension, hyperlipidemia, diabetes mellitus, GERD, depression, anxiety, stroke, seizure, dementia, CAD, multiple urinary tract infection in the past, who presents with altered mental status and dysuria.    PT Comments    Pt progressing towards physical therapy goals. Was able to improve ambulation distance with 1 seated rest break. Had OT immediately prior to PT session and speech therapy immediately after, so limited session to gait training only. Will continue to follow and progress as able per POC.   Follow Up Recommendations  Home health PT;Supervision/Assistance - 24 hour     Equipment Recommendations  Wheelchair cushion (measurements PT);Wheelchair (measurements PT)    Recommendations for Other Services       Precautions / Restrictions Precautions Precautions: Fall Restrictions Weight Bearing Restrictions: No    Mobility  Bed Mobility Overal bed mobility: Needs Assistance Bed Mobility: Supine to Sit     Supine to sit: Min assist Sit to supine: Supervision   General bed mobility comments: Assist to scoot HOB and assist with trunk with supine to sit transition.  Transfers Overall transfer level: Needs assistance Equipment used: Rolling walker (2 wheeled) Transfers: Sit to/from Stand Sit to Stand: Min assist         General transfer comment: Pt was able to power-up to full standing position with min assist initially from bed, and then with min guard assist from recliner.   Ambulation/Gait Ambulation/Gait assistance: Min assist Ambulation Distance (Feet): 150 Feet Assistive device: Rolling walker (2 wheeled) Gait Pattern/deviations: Step-through pattern;Decreased stride length;Trunk flexed Gait velocity: Decreased Gait velocity interpretation: Below normal speed for  age/gender General Gait Details: Pt required 1 seated rest break at about 75'. Ambulated well with minimal cueing for direction and walker management.    Stairs            Wheelchair Mobility    Modified Rankin (Stroke Patients Only)       Balance Overall balance assessment: Needs assistance Sitting-balance support: Feet supported;No upper extremity supported Sitting balance-Leahy Scale: Poor     Standing balance support: Bilateral upper extremity supported Standing balance-Leahy Scale: Poor                      Cognition Arousal/Alertness: Awake/alert Behavior During Therapy: WFL for tasks assessed/performed Overall Cognitive Status: History of cognitive impairments - at baseline                      Exercises      General Comments        Pertinent Vitals/Pain Pain Assessment: Faces Faces Pain Scale: Hurts little more Pain Location: head Pain Descriptors / Indicators: Headache Pain Intervention(s): Limited activity within patient's tolerance;Monitored during session;Repositioned    Home Living Family/patient expects to be discharged to:: Private residence Living Arrangements: Spouse/significant other;Non-relatives/Friends Available Help at Discharge: Family;Available 24 hours/day Type of Home: House       Home Equipment: Walker - 2 wheels;Shower seat;Bedside commode;Adaptive equipment      Prior Function Level of Independence: Needs assistance  Gait / Transfers Assistance Needed: needed assist from spouse wtih walker and still fell frequently ADL's / 15 Assistance Needed: assist with dressing at times and spouse manages medications; pt able to bathe self but would get assist to perform more thoroughly Comments: spouse visiting her in hospital transport chair himself  PT Goals (current goals can now be found in the care plan section) Acute Rehab PT Goals Patient Stated Goal: not stated PT Goal Formulation: With  patient/family Time For Goal Achievement: 03/15/15 Potential to Achieve Goals: Fair Progress towards PT goals: Progressing toward goals    Frequency  Min 3X/week    PT Plan Current plan remains appropriate    Co-evaluation             End of Session Equipment Utilized During Treatment: Gait belt Activity Tolerance: Patient tolerated treatment well Patient left: in chair;with call bell/phone within reach;with family/visitor present     Time: 1700-1749 PT Time Calculation (min) (ACUTE ONLY): 21 min  Charges:  $Gait Training: 8-22 mins                    G Codes:      Rolinda Roan 22-Mar-2015, 2:09 PM   Rolinda Roan, PT, DPT Acute Rehabilitation Services Pager: [phone]

## 2015-03-02 NOTE — Telephone Encounter (Signed)
Patient is requesting a Rx for Gabapentin.  It does not appear we have prescribed this.  I called the pharmacy.  They said she was getting this Rx from [facility] 300mg  at bedtime.  States they will not refill Rx.  Would you like to prescribe?  Please advise.  Thank you.

## 2015-03-02 NOTE — Telephone Encounter (Signed)
Called patient to advise.  Got no answer.  Left message.

## 2015-03-02 NOTE — Evaluation (Signed)
Occupational Therapy Evaluation Patient Details Name: Martha Olsen MRN: [id number] DOB: 11/20/1935 Today's Date: 03/02/2015    History of Present Illness Patient is a 79 y.o. female with PMH of hypertension, hyperlipidemia, diabetes mellitus, GERD, depression, anxiety, stroke, seizure, dementia, CAD, multiple urinary tract infection in the past, who presents with altered mental status and dysuria.   Clinical Impression   Pt admitted with above. Pt getting assist with ADLs/IADLs, PTA. Feel pt will benefit from acute OT to decrease burden of care and increase pt's independence. Recommending HHOT upon d/c.     Follow Up Recommendations  Home health OT;Supervision/Assistance - 24 hour    Equipment Recommendations  None recommended by OT    Recommendations for Other Services       Precautions / Restrictions Precautions Precautions: Fall Restrictions Weight Bearing Restrictions: No      Mobility Bed Mobility Overal bed mobility: Needs Assistance Bed Mobility: Supine to Sit;Sit to Supine     Supine to sit: Min assist Sit to supine: Supervision   General bed mobility comments: assist to scoot HOB and assist with trunk with supine to sit transition.  Transfers Overall transfer level: Needs assistance Equipment used: Rolling walker (2 wheeled) Transfers: Sit to/from Stand Sit to Stand: Min assist              Balance Overall balance assessment: Needs assistance;History of Falls      Pt leaning posteriorly sitting EOB during functional tasks and OT gave cues for pt to lean forward. OT assisted in trying to position pt's hips better to try to get left foot on floor.                                     ADL Overall ADL's : Needs assistance/impaired     Grooming: Set up;Supervision/safety;Brushing hair;Sitting;Wash/dry face Grooming Details (indicate cue type and reason): pt with posterior lean sitting on EOB-cues given to lean forward              Lower Body Dressing: Moderate assistance;Sit to/from stand   Toilet Transfer: Minimal assistance;Ambulation;RW (sit to stand from bed)           Functional mobility during ADLs: Minimal assistance;Rolling walker (short distance) General ADL Comments: Discussed tub bench and tub transfer with this, but unsure it will work in pt's tub. Recommended pt not get in tub until home health therapy goes over this at home. Discussed d/c recommendations and d/c options. Educated on safety such as rugs/items on floor.      Vision     Perception     Praxis      Pertinent Vitals/Pain Pain Assessment: Faces Faces Pain Scale: Hurts little more Pain Location: head Pain Descriptors / Indicators: Headache Pain Intervention(s): Monitored during session     Hand Dominance     Extremity/Trunk Assessment Upper Extremity Assessment Upper Extremity Assessment: Generalized weakness   Lower Extremity Assessment Lower Extremity Assessment: Defer to PT evaluation       Communication Communication Communication: No difficulties   Cognition Arousal/Alertness: Awake/alert Behavior During Therapy: WFL for tasks assessed/performed Overall Cognitive Status: History of cognitive impairments - at baseline                     General Comments       Exercises       Shoulder Instructions      Home Living Family/patient expects to  be discharged to:: Private residence Living Arrangements: Spouse/significant other;Non-relatives/Friends Available Help at Discharge: Family;Available 24 hours/day Type of Home: House             Bathroom Shower/Tub: Tub only (tub with steps)         Home Equipment: Walker - 2 wheels;Shower seat;Bedside commode;Adaptive equipment Adaptive Equipment: Sock aid (per pt report)        Prior Functioning/Environment Level of Independence: Needs assistance  Gait / Transfers Assistance Needed: needed assist from spouse wtih walker and still fell  frequently ADL's / 58 Assistance Needed: assist with dressing at times and spouse manages medications; pt able to bathe self but would get assist to perform more thoroughly   Comments: spouse visiting her in hospital transport chair himself    OT Diagnosis: Generalized weakness   OT Problem List: Decreased knowledge of precautions;Decreased knowledge of use of DME or AE;Decreased cognition;Decreased strength;Decreased range of motion;Impaired balance (sitting and/or standing)   OT Treatment/Interventions: Self-care/ADL training;DME and/or AE instruction;Therapeutic activities;Patient/family education;Balance training;Cognitive remediation/compensation;Therapeutic exercise    OT Goals(Current goals can be found in the care plan section) Acute Rehab OT Goals Patient Stated Goal: not stated OT Goal Formulation: With patient/family Time For Goal Achievement: 03/09/15 Potential to Achieve Goals: Good ADL Goals Pt Will Perform Lower Body Bathing: with min guard assist;with caregiver independent in assisting;sit to/from stand;with adaptive equipment Pt Will Perform Lower Body Dressing: sit to/from stand;with caregiver independent in assisting;with min guard assist;with adaptive equipment Pt Will Transfer to Toilet: ambulating;with min guard assist Pt Will Perform Toileting - Clothing Manipulation and hygiene: with min guard assist;sit to/from stand  OT Frequency: Min 2X/week   Barriers to D/C:            Co-evaluation              End of Session Equipment Utilized During Treatment: Gait belt;Rolling walker  Activity Tolerance: Patient tolerated treatment well Patient left: in bed;with call bell/phone within reach;with bed alarm set;with family/visitor present   Time: 0902-0919 OT Time Calculation (min): 17 min Charges:  OT General Charges $OT Visit: 1 Procedure OT Evaluation $Initial OT Evaluation Tier I: 1 Procedure G-CodesBenito Mccreedy  OTR/L [id number] 03/02/2015, 11:11 AM

## 2015-03-02 NOTE — Discharge Summary (Signed)
Physician Discharge Summary  Martha Olsen QJJ:[id number] DOB: Jul 23, 1935 DOA: 02/28/2015  PCP: Donnie Coffin, MD  Admit date: 02/28/2015 Discharge date: 03/02/2015  Time spent: 45 minutes  Recommendations for Outpatient Follow-up:  1. PCP Dr.Mitchell in 1 week 2. Home health PT/OT/RN/Aide  Discharge Diagnoses:  Principal Problem:   UTI (lower urinary tract infection) Active Problems:   DM (diabetes mellitus)   Dyslipidemia   Stroke   Hypertension   Depression   Seizure   CAD (coronary artery disease)   Essential hypertension   GERD (gastroesophageal reflux disease)   Sepsis   Acute encephalopathy   Fall   Dementia   Pressure ulcer   Discharge Condition: stable  Diet recommendation: diabetic, dysphagia 3  Filed Weights   02/28/15 2034 03/01/15 0139  Weight: 90.719 kg (200 lb) 85.6 kg (188 lb 11.4 oz)    History of present illness:  Chief Complaint: Altered mental status, dysuria HPI: Martha Olsen is a 79 y.o. female with PMH of hypertension, hyperlipidemia, diabetes mellitus, GERD, depression, anxiety, stroke, seizure, dementia, CAD, multiple urinary tract infection in the past, who presented with altered mental status and dysuria. Patient has AMS and dementia unable to provide much history. Therefore, most of the history is obtained by discussing the case with the ED physician and per her daughter. It seems that patient recently had several episodes of urinary tract infection in the past 2 months. She was treated with 4 rounds of antibitocs by her PCP. Last antibiotics was given on Friday after she had positive urinalysis (her daughter does not remember the name of antitiotics). Patient still had dysuria, burning on urination and increased urinary frequency. 8/14 patient was noted to be more confused and drowsy.  Hospital Course:   UTI and sepsis:  -Had fever, abnormal UA with dysuria and urinary frequency on admission -Was on oral Bactrim since Friday, urine  culture negative suspect was because she was partially treated. -Clinically much improved on IV ceftriaxone, change to oral antibiotics, by mouth cefpodoxime for 3 more days. -Blood cultures negative. -Mentation much better than recent baseline  Acute encephalopathy:  -Has baseline dementia with some behavioral problems worsened by UTI/sepsis  -Improved , back to baseline  -continue Abx, seroquel -stopped Solifenacin, avoid anticholinergics in elderly with dementia -now AAOx3, appropriate having a reasonable conversation today -She will go home with her daughters and home health PT OT RN/aide  Seizure: -continue oral Keppra  DM-II: Last A1c 7.0 on 10/12/12, well controled -on Amaryl and metformin at home -SSI  HLD: Last LDL was 40 on 10/14/12 -Continue home medications: Lipitor  GERD: -Pepcid  Hx of stroke: -ASA  HTN: -stable, continue ACE/HCTZ, coreg  Depression and anxiety: Stable, no suicidal or homicidal ideations. -Continue home medications: Seroquel, Paxil, BuSpar  CAD (coronary artery disease): No chest pain. -ASA and metoprolol.   Discharge Exam: Filed Vitals:   03/02/15 0629  BP: 150/77  Pulse: 80  Temp: 98.7 F (37.1 C)  Resp: 24    General: AAOx3, some short term deficits Cardiovascular: S1S2/RRR Respiratory: CTAB  Discharge Instructions    Current Discharge Medication List    START taking these medications   Details  cefpodoxime (VANTIN) 100 MG tablet Take 1 tablet (100 mg total) by mouth 2 (two) times daily. For 4days Qty: 8 tablet, Refills: 0      CONTINUE these medications which have NOT CHANGED   Details  aspirin 81 MG chewable tablet Chew 1 tablet (81 mg total) by mouth daily. Qty: 30  tablet, Refills: 2    atorvastatin (LIPITOR) 40 MG tablet Take 40 mg by mouth daily.    busPIRone (BUSPAR) 15 MG tablet Take 15 mg by mouth 3 (three) times daily.    carvedilol (COREG) 6.25 MG tablet Take 1 tablet (6.25 mg total) by mouth 2 (two)  times daily with a meal. Qty: 60 tablet, Refills: 12    gabapentin (NEURONTIN) 300 MG capsule Take 1 capsule (300 mg total) by mouth at bedtime. Qty: 30 capsule, Refills: 5    glimepiride (AMARYL) 2 MG tablet Take 2 mg by mouth daily before breakfast.    ibuprofen (ADVIL,MOTRIN) 200 MG tablet Take 200 mg by mouth every 6 (six) hours as needed for mild pain.    levETIRAcetam (KEPPRA) 500 MG tablet Take 1 tablet (500 mg total) by mouth 2 (two) times daily. Qty: 60 tablet, Refills: 11    metFORMIN (GLUCOPHAGE) 1000 MG tablet Take 1,000 mg by mouth 2 (two) times daily with a meal.  Refills: 1    nitroGLYCERIN (NITROSTAT) 0.4 MG SL tablet Place 1 tablet (0.4 mg total) under the tongue every 5 (five) minutes as needed for chest pain. Qty: 25 tablet, Refills: 3    PARoxetine (PAXIL) 20 MG tablet Take 20 mg by mouth every morning. Refills: 0    PROAIR HFA 108 (90 BASE) MCG/ACT inhaler Inhale 1-2 puffs into the lungs every 6 (six) hours as needed for wheezing or shortness of breath.     QUEtiapine (SEROQUEL) 25 MG tablet One po qhs xone week, then 2 tabs po qhs Qty: 60 tablet, Refills: 6    ranitidine (ZANTAC) 150 MG tablet Take 150 mg by mouth 2 (two) times daily as needed for heartburn.    traMADol (ULTRAM) 50 MG tablet Take 1 tablet (50 mg total) by mouth every 6 (six) hours as needed. Qty: 30 tablet, Refills: 3      STOP taking these medications     lisinopril-hydrochlorothiazide (PRINZIDE,ZESTORETIC) 20-12.5 MG per tablet      solifenacin (VESICARE) 5 MG tablet      sulfamethoxazole-trimethoprim (BACTRIM DS,SEPTRA DS) 800-160 MG per tablet        Allergies  Allergen Reactions  . Codeine Hives  . Metformin And Related Diarrhea   Follow-up Information    Follow up with [facility].   Why:  Nurses Aide, Physical therapy   Contact information:   [address] [place] [address] [phone]        The results of significant diagnostics  from this hospitalization (including imaging, microbiology, ancillary and laboratory) are listed below for reference.    Significant Diagnostic Studies: Dg Tibia/fibula Right  02/28/2015   CLINICAL DATA:  Bruising and swelling to the right anterior and lateral knee. Recent fall. Initial encounter.  EXAM: RIGHT TIBIA AND FIBULA - 2 VIEW  COMPARISON:  None.  FINDINGS: No evidence of acute fracture or dislocation.  Knee osteoarthritis, better evaluated on contemporaneous knee radiograph.  No radiopaque foreign body.  IMPRESSION: No acute osseous finding.   Electronically Signed   By: Monte Fantasia M.D.   On: 02/28/2015 21:23   Ct Head Wo Contrast  02/28/2015   CLINICAL DATA:  Increasing altered mental status  EXAM: CT HEAD WITHOUT CONTRAST  TECHNIQUE: Contiguous axial images were obtained from the base of the skull through the vertex without intravenous contrast.  COMPARISON:  01/22/2015  FINDINGS: Bony calvarium is intact. Chronic right basal ganglia infarct with ischemic changes and deep centrum semi ovale  on the right is noted. No acute hemorrhage, acute infarction or space-occupying mass lesion is noted.  IMPRESSION: Chronic ischemic changes similar to that noted on the prior exam. No acute abnormality is noted.   Electronically Signed   By: Inez Catalina M.D.   On: 02/28/2015 22:01   Dg Chest Port 1 View  02/28/2015   CLINICAL DATA:  Sepsis  EXAM: PORTABLE CHEST - 1 VIEW  COMPARISON:  01/22/2015  FINDINGS: Normal heart size and mediastinal contours. No definitive pneumonia, but coarsened markings noted at the left base. No edema, effusion, or pneumothorax.  Reverse left glenohumeral arthroplasty with stable surrounding mineralization.  IMPRESSION: 1. No acute finding. 2. If pneumonia is suspected clinically, recommend short follow-up to re-evaluate the left base.   Electronically Signed   By: Monte Fantasia M.D.   On: 02/28/2015 23:02   Dg Knee Complete 4 Views Right  02/28/2015   CLINICAL DATA:   Fall 1 week ago. Bruising and swelling to the anterior and lateral knee.  EXAM: RIGHT KNEE - COMPLETE 4+ VIEW  COMPARISON:  Right knee radiographs 11/08/2014.  FINDINGS: Moderate tricompartmental degenerative changes are present. Loose bodies are present in the knee. No acute fracture is present. The knee is located. There is no significant effusion. Prepatellar soft tissue swelling is present.  IMPRESSION: 1. Prepatellar soft tissue swelling without evidence for acute fracture. 2. Moderate degenerative changes are again noted in the knee. 3. No acute osseous abnormality.   Electronically Signed   By: San Morelle M.D.   On: 02/28/2015 21:27    Microbiology: Recent Results (from the past 240 hour(s))  Culture, blood (routine x 2)     Status: None (Preliminary result)   Collection Time: 02/28/15  8:45 PM  Result Value Ref Range Status   Specimen Description BLOOD RIGHT ANTECUBITAL  Final   Special Requests BOTTLES DRAWN AEROBIC AND ANAEROBIC 5CC   Final   Culture NO GROWTH 2 DAYS  Final   Report Status PENDING  Incomplete  Urine culture     Status: None   Collection Time: 02/28/15  8:58 PM  Result Value Ref Range Status   Specimen Description URINE, CATHETERIZED  Final   Special Requests NONE  Final   Culture NO GROWTH 2 DAYS  Final   Report Status 03/02/2015 FINAL  Final  Culture, blood (routine x 2)     Status: None (Preliminary result)   Collection Time: 02/28/15 10:10 PM  Result Value Ref Range Status   Specimen Description BLOOD RIGHT HAND  Final   Special Requests BOTTLES DRAWN AEROBIC ONLY 5CC  Final   Culture NO GROWTH 2 DAYS  Final   Report Status PENDING  Incomplete  C difficile quick screen w PCR reflex     Status: None   Collection Time: 03/01/15 12:54 PM  Result Value Ref Range Status   C Diff antigen NEGATIVE NEGATIVE Final   C Diff toxin NEGATIVE NEGATIVE Final   C Diff interpretation Negative for toxigenic C. difficile  Final     Labs: Basic Metabolic  Panel:  Recent Labs Lab 02/28/15 2044 03/01/15 0450 03/02/15 0555  NA 139 141 138  K 3.6 3.5 3.1*  CL 105 107 106  CO2 21* 23 24  GLUCOSE 154* 123* 110*  BUN 14 11 5*  CREATININE 0.80 0.64 0.56  CALCIUM 8.9 8.3* 7.9*   Liver Function Tests:  Recent Labs Lab 03/01/15 0450 03/02/15 0555  AST 35 42*  ALT 19 22  ALKPHOS 54  54  BILITOT 1.0 1.1  PROT 5.9* 5.3*  ALBUMIN 3.3* 2.8*   No results for input(s): LIPASE, AMYLASE in the last 168 hours. No results for input(s): AMMONIA in the last 168 hours. CBC:  Recent Labs Lab 02/28/15 2044 03/01/15 0450 03/02/15 0555  WBC 6.2 5.3 4.0  NEUTROABS 5.5  --   --   HGB 10.9* 11.0* 9.4*  HCT 33.8* 35.5* 29.7*  MCV 90.9 92.0 90.8  PLT 180 144* 146*   Cardiac Enzymes:  Recent Labs Lab 02/28/15 2044  TROPONINI <0.03   BNP: BNP (last 3 results) No results for input(s): BNP in the last 8760 hours.  ProBNP (last 3 results) No results for input(s): PROBNP in the last 8760 hours.  CBG:  Recent Labs Lab 03/01/15 0556 03/01/15 1137 03/01/15 1558 03/01/15 2105 03/02/15 0627  GLUCAP 163* 123* 94 104* 110*       Signed:  Kortnie Stovall  [facility] 03/02/2015, 1:37 PM

## 2015-03-02 NOTE — Telephone Encounter (Signed)
I refilled her gabapentin 300 mg 1 tablet every night, 30 tablets with 5 refills

## 2015-03-02 NOTE — Care Management Note (Signed)
Case Management Note  Patient Details  Name: Martha Olsen MRN: [id number] Date of Birth: [birth year]  Subjective/Objective:   Pt admitted with UTI                 Action/Plan:  Pt is from home independent with spouse.  CM will monitor for disposition needs   Expected Discharge Date:                  Expected Discharge Plan:  Home/Self Care  In-House Referral:     Discharge planning Services  CM Consult  Post Acute Care Choice:    Choice offered to:  Patient  DME Arranged:   DME Agency:    HH Arranged:  PT, Nurse's Aide [place] Agency:  [place]  Status of Service:  Complete, will sign off  Medicare Important Message Given:    Date Medicare IM Given:    Medicare IM give by:    Date Additional Medicare IM Given:    Additional Medicare Important Message give by:     If discussed at [place] of Stay Meetings, dates discussed:    Additional Comments: CM assessed pt , pt will have 24 hour supervision provided by husband and daughter Manuela Schwartz post discharge.  Pt stated she already had wheelchair with cushion, bedside commode, and rollator at home, therefore wheelchair will not be ordered prior to discharge.  CM offered choice for HH, pt chose [facility], agency contacted and referral was accepted. Maryclare Labrador, RN 03/02/2015, 11:02 AM

## 2015-03-02 NOTE — Telephone Encounter (Signed)
Pt needs refill on gabapentin (NEURONTIN) capsule 300 mg

## 2015-03-02 NOTE — Evaluation (Signed)
Clinical/Bedside Swallow Evaluation Patient Details  Name: Martha Olsen MRN: [id number] Date of Birth: [birth year]  Today's Date: 03/02/2015 Time: SLP Start Time (ACUTE ONLY): 47 SLP Stop Time (ACUTE ONLY): 0948 SLP Time Calculation (min) (ACUTE ONLY): 13 min  Past Medical History:  Past Medical History  Diagnosis Date  . Stroke 2002    right side weakness  . Hypertension   . Diabetes mellitus 2011  . Headache(784.0)   . Anxiety   . Depression   . Seizure   . History of echocardiogram     Echo 5/16: EF 55-60%, normal wall motion, grade 1 diastolic dysfunction  . Dementia   . UTI (urinary tract infection) 02/2015   Past Surgical History:  Past Surgical History  Procedure Laterality Date  . Foot surgery    . Cholecystectomy  1993  . Tonsillectomy  1951  . Eye surgery  2009  . Abdominal hysterectomy  1980's  . Appendectomy  1980's  . Joint replacement  2012    Hip, Knee  . Reverse shoulder arthroplasty  12/07/2011    Procedure: REVERSE SHOULDER ARTHROPLASTY;  Surgeon: Marin Shutter, MD;  Location: [facility];  Service: Orthopedics;  Laterality: Left;  left total reverse shoulder   HPI:  Martha Olsen is a 79 y.o. female with PMH of dementia, hypertension, hyperlipidemia, diabetes mellitus, GERD, depression, anxiety, stroke, seizure, CAD, multiple urinary tract infection in the past, who presents with altered mental status and dysuria. No prior SLP notes found in chart. CXR clear.   Assessment / Plan / Recommendation Clinical Impression  Pt demonstrates stable swallow function. Pt is slightly impulsive with meals with large bolus sizes and prolonged mastication, still overloading mouth with PO in mouth. Discussed precautions and cueing with daughter. Despite this, pt tolerated meal with no signs of aspiration and is recommended to continue a Dys 3 (mechanical soft) diet and thin liquids. No SLP f/u needed.     Aspiration Risk  Mild    Diet Recommendation Dysphagia 3 (Mech  soft);Thin   Medication Administration: Whole meds with liquid Compensations: Minimize environmental distractions;Slow rate;Small sips/bites    Other  Recommendations Oral Care Recommendations: Oral care BID   Follow Up Recommendations       Frequency and Duration        Pertinent Vitals/Pain NA    SLP Swallow Goals     Swallow Study Prior Functional Status  Type of Home: House Available Help at Discharge: Family;Available 24 hours/day    General Other Pertinent Information: Martha Olsen is a 79 y.o. female with PMH of dementia, hypertension, hyperlipidemia, diabetes mellitus, GERD, depression, anxiety, stroke, seizure, CAD, multiple urinary tract infection in the past, who presents with altered mental status and dysuria. No prior SLP notes found in chart. CXR clear. Type of Study: Bedside swallow evaluation Previous Swallow Assessment: none Diet Prior to this Study: NPO Temperature Spikes Noted: Yes History of Recent Intubation: No Behavior/Cognition: Alert;Cooperative;Pleasant mood Oral Cavity - Dentition: Adequate natural dentition/normal for age Self-Feeding Abilities: Able to feed self Patient Positioning: Upright in chair/Tumbleform Baseline Vocal Quality: Normal Volitional Cough: Strong Volitional Swallow: Able to elicit    Oral/Motor/Sensory Function Overall Oral Motor/Sensory Function: Appears within functional limits for tasks assessed   Ice Chips     Thin Liquid Thin Liquid: Within functional limits Presentation: Cup;Straw    Nectar Thick Nectar Thick Liquid: Not tested   Honey Thick Honey Thick Liquid: Not tested   Puree Puree: Within functional limits   Solid  GO    Solid: Impaired Presentation: Self Fed Oral Phase Impairments: Impaired anterior to posterior transit Oral Phase Functional Implications: Oral holding      Herbie Baltimore, MA CCC-SLP [phone]  Johnda Billiot, Katherene Ponto 03/02/2015,11:41 AM

## 2015-03-03 ENCOUNTER — Inpatient Hospital Stay (HOSPITAL_COMMUNITY): Admission: RE | Admit: 2015-03-03 | Payer: Medicare Other | Source: Ambulatory Visit | Admitting: Orthopedic Surgery

## 2015-03-03 ENCOUNTER — Encounter (HOSPITAL_COMMUNITY): Admission: RE | Payer: Self-pay | Source: Ambulatory Visit

## 2015-03-03 SURGERY — ARTHROPLASTY, KNEE, TOTAL
Anesthesia: General | Laterality: Right

## 2015-03-05 LAB — CULTURE, BLOOD (ROUTINE X 2)
Culture: NO GROWTH
Culture: NO GROWTH

## 2015-03-08 ENCOUNTER — Telehealth: Payer: Self-pay | Admitting: Neurology

## 2015-03-08 NOTE — Telephone Encounter (Signed)
Martha Olsen returned call and Martha Olsen was skyped.  She states she will call him back [phone].

## 2015-03-08 NOTE — Telephone Encounter (Signed)
Spoke to Shanon Brow - he is concerned about an increase in falls, possible seizure activity, TIA symptoms.  She was better today and has been worked in to [employer] schedule on 03/09/15.  I instructed the family to proceed to the ED for any worsening or concerning symptoms that occur prior to her appt.

## 2015-03-08 NOTE — Telephone Encounter (Signed)
David with [facility] called stating patient is showing mild signs of TIA-toes curling upward, rotation inward of left foot,left leg and arm weakness,left side of mouth not curling up-not a droop though.Her speech is fine, eyes are tracking, can see fine out of left eye. He can be reached at [phone].

## 2015-03-08 NOTE — Telephone Encounter (Signed)
Called back no answer left message.

## 2015-03-08 NOTE — Telephone Encounter (Signed)
Left message for a return call

## 2015-03-09 ENCOUNTER — Ambulatory Visit: Payer: Medicare Other | Admitting: Neurology

## 2015-03-09 ENCOUNTER — Telehealth: Payer: Self-pay | Admitting: Adult Health

## 2015-03-09 ENCOUNTER — Encounter: Payer: Self-pay | Admitting: Adult Health

## 2015-03-09 ENCOUNTER — Ambulatory Visit (INDEPENDENT_AMBULATORY_CARE_PROVIDER_SITE_OTHER): Payer: Medicare Other | Admitting: Adult Health

## 2015-03-09 VITALS — BP 164/70 | HR 76 | Ht 64.0 in | Wt 188.0 lb

## 2015-03-09 DIAGNOSIS — R413 Other amnesia: Secondary | ICD-10-CM

## 2015-03-09 DIAGNOSIS — R269 Unspecified abnormalities of gait and mobility: Secondary | ICD-10-CM

## 2015-03-09 DIAGNOSIS — M6289 Other specified disorders of muscle: Secondary | ICD-10-CM | POA: Diagnosis not present

## 2015-03-09 DIAGNOSIS — R569 Unspecified convulsions: Secondary | ICD-10-CM | POA: Diagnosis not present

## 2015-03-09 DIAGNOSIS — R531 Weakness: Secondary | ICD-10-CM

## 2015-03-09 NOTE — Progress Notes (Signed)
PATIENT: Martha Olsen DOB: 14-Nov-1935  REASON FOR VISIT: follow up HISTORY FROM: patient  HISTORY OF PRESENT ILLNESS: Ms. Schamberger is a 79 year old female with a history of seizure, stroke, memory impairment and gait abnormality. She returns today for an evaluation. The patient was recently hospitalized for a septic UTI. The patient's daughter states that that Sunday the patient became very weak on the left side was unable to move her arm or leg. She states that this did not resolve and therefore they called the ambulance. The patient was also confused. Once admitted to the hospital they discovered that she had a urinary tract infection and she was admitted and treated with antibiotics. Her symptoms improved at discharge although the patient was still weak. The patient has been having home health come to her house. Daughter states that this past Sunday the patient woke up in the middle of the night and again was weak on the left side. She states that when she got up to walk she was dragging and that foot. The symptoms lasted for approximately 1-2 hours and then resolved. Since then the daughter feels that the patient has returned to her baseline. She states that her memory and behavior/confusion has improved since starting Seroquel. She is not had any seizure events. The patient uses a walker to ambulate with when traveling short distances. She uses a wheelchair for longer distances. Denies any recent falls. The patient states that lately she's been getting muscle spasms in the left hamstring. She states that she uses cold and hot packs and this relieves her discomfort. She states that this normally occurs at bedtime. Family states that she stays well hydrated drinking water throughout the day. She denies any new neurological symptoms she returns today for an evaluation.   HISTORY [name]): Mrs. Hellums is a 79 years old right-handed Caucasian female, accompanied by her husband, follow-up for seizure,  gait difficulty   I saw her initially following her hospital discharge in March 2013,  She has past medical history of HTN, s/p CVA right basal ganglia 11/2001, dyslipidemia, DM, anxiety/depression, s/p appendectomy, s/p hysterectomy, DJD, s/p left hip replacement 03/2002, left knee replacement 01/2011, foot surgery and reverse left shoulder arthroplasty 11/2011.  In October 13 2011, she was at home, sitting at the rocking chair, was noted by her family fell over a chair, had generalized tonic-clonic seizure, patient has no warning signs, no collection of the event, EMS was called, she was taken by ambulance to the hospital, she could not remember waking up in the hospital confused, she was put on Keppra 500 mg twice a day, tolerating it well, there was no recurrent seizure. There was mild elevated troponin, she was seen by cardiologist, will continue followup  EEG was normal. MRI of the brain has demonstrated old basal ganglion, and coronal radiata stroke, no acute lesions,  She complains of headache everyday, right temporal regions, light noise is bothersome,   She has quit driving since 4132, husband retired, she is active at home, house work  UPDATE July 18th 2014:  She is overall doing well, no recurrent seizure, tolerating medications, she has mild gait difficulty due to left shoulder, left hip replacement.  UPDATE Dec 29th 2015:  She has no recurrent seizures, tolerating Keppra 500 mg twice a day without significant side effect, she has increased gait difficulty because of joints, knee pain, She has been complaining of a year history of worsening headaches, bilateral frontal, vortex region, she has to take ibuprofen 200  mg 2-3 tablets each time, sometimes two dosage each day  UPDATE Feb 2nd 2016: She was given prescription of tramadol 50 mg as needed since last visit December 2015, her headache has much improved, taking tramadol about twice each week, she continue has bilateral knee pain,  low back pain, gait difficulty, she also has nocturia, frequent awakening, loss snoring, excessive daytime sleepiness, fatigue, sleep studies pending in August 25 2014  ESR, TSH was normal December 2015, mild elevated C-reactive protein 11.  UPDATE February 18 2015: She is accompanied by her husband, and daughter at today's clinical visit,  She had a urgent visit with Dr. Jaynee Eagles in February 09 2015 because of increased confusion, was found to have UTI, she was given prescription of Augmentin for 5 days, there was no significant change in her confusion  February 09 2015, EEG recording asymmetric slowing of the left hemisphere, with occasionally sharp transient  Patient's continue have slow worsening confusion, agitation, paranoid ideation, difficulty sleeping at nighttime, She had 10 years of education, worked two jobs most of her life, she retired at age 47, was very active until she had stroke in 2013, was noted to have declining functional status, increased memory trouble, especially since January 2016,  She also has worsening gait difficulty,worsening right knee pain, had a recent evaluation by orthopedic surgeon,she is not a candidate for right knee replacement anymore,because of worsening memory trouble, gradually increased confusion  She also complains of low back pain,worsening bowel and bladder incontinence  REVIEW OF SYSTEMS: Out of a complete 14 system review of symptoms, the patient complains only of the following symptoms, and all other reviewed systems are negative.  See history of present illness  ALLERGIES: Allergies  Allergen Reactions  . Codeine Hives  . Metformin And Related Diarrhea    HOME MEDICATIONS: Outpatient Prescriptions Prior to Visit  Medication Sig Dispense Refill  . aspirin 81 MG chewable tablet Chew 1 tablet (81 mg total) by mouth daily. 30 tablet 2  . atorvastatin (LIPITOR) 40 MG tablet Take 40 mg by mouth daily.    . busPIRone (BUSPAR) 15 MG tablet Take 15 mg  by mouth 3 (three) times daily.    . carvedilol (COREG) 6.25 MG tablet Take 1 tablet (6.25 mg total) by mouth 2 (two) times daily with a meal. 60 tablet 12  . cefpodoxime (VANTIN) 100 MG tablet Take 1 tablet (100 mg total) by mouth 2 (two) times daily. For 4days 8 tablet 0  . gabapentin (NEURONTIN) 300 MG capsule Take 1 capsule (300 mg total) by mouth at bedtime. 30 capsule 5  . glimepiride (AMARYL) 2 MG tablet Take 2 mg by mouth daily before breakfast.    . ibuprofen (ADVIL,MOTRIN) 200 MG tablet Take 200 mg by mouth every 6 (six) hours as needed for mild pain.    Marland Kitchen levETIRAcetam (KEPPRA) 500 MG tablet Take 1 tablet (500 mg total) by mouth 2 (two) times daily. 60 tablet 11  . lisinopril-hydrochlorothiazide (PRINZIDE,ZESTORETIC) 20-12.5 MG per tablet Take 1 tablet by mouth daily.    . metFORMIN (GLUCOPHAGE) 1000 MG tablet Take 1,000 mg by mouth 2 (two) times daily with a meal.   1  . nitroGLYCERIN (NITROSTAT) 0.4 MG SL tablet Place 1 tablet (0.4 mg total) under the tongue every 5 (five) minutes as needed for chest pain. 25 tablet 3  . PARoxetine (PAXIL) 20 MG tablet Take 20 mg by mouth every morning.  0  . PROAIR HFA 108 (90 BASE) MCG/ACT inhaler Inhale 1-2 puffs  into the lungs every 6 (six) hours as needed for wheezing or shortness of breath.     . QUEtiapine (SEROQUEL) 25 MG tablet One po qhs xone week, then 2 tabs po qhs (Patient taking differently: Take 25-50 mg by mouth at bedtime. One po qhs xone week, then 2 tabs po qhs) 60 tablet 6  . ranitidine (ZANTAC) 150 MG tablet Take 150 mg by mouth 2 (two) times daily as needed for heartburn.    . traMADol (ULTRAM) 50 MG tablet Take 1 tablet (50 mg total) by mouth every 6 (six) hours as needed. (Patient not taking: Reported on 03/09/2015) 30 tablet 3   No facility-administered medications prior to visit.    PAST MEDICAL HISTORY: Past Medical History  Diagnosis Date  . Stroke 2002    right side weakness  . Hypertension   . Diabetes mellitus 2011    . Headache(784.0)   . Anxiety   . Depression   . Seizure   . History of echocardiogram     Echo 5/16: EF 55-60%, normal wall motion, grade 1 diastolic dysfunction  . Dementia   . UTI (urinary tract infection) 02/2015    PAST SURGICAL HISTORY: Past Surgical History  Procedure Laterality Date  . Foot surgery    . Cholecystectomy  1993  . Tonsillectomy  1951  . Eye surgery  2009  . Abdominal hysterectomy  1980's  . Appendectomy  1980's  . Joint replacement  2012    Hip, Knee  . Reverse shoulder arthroplasty  12/07/2011    Procedure: REVERSE SHOULDER ARTHROPLASTY;  Surgeon: Senaida Lange, MD;  Location: [facility] OR;  Service: Orthopedics;  Laterality: Left;  left total reverse shoulder    FAMILY HISTORY: Family History  Problem Relation Age of Onset  . Anesthesia problems Neg Hx   . CAD Father 52  . Stroke Mother 20  . Heart attack Father   . Hypertension Mother     SOCIAL HISTORY: Social History   Social History  . Marital Status: Married    Spouse Name: Verdon Cummins  . Number of Children: 4  . Years of Education: 9th   Occupational History  . Programmer, applications     Retired   Social History Main Topics  . Smoking status: Never Smoker   . Smokeless tobacco: Never Used  . Alcohol Use: No  . Drug Use: No  . Sexual Activity: Not on file   Other Topics Concern  . Not on file   Social History Narrative   Son recently died of cancer.  Lives at home with husband Baird [employer]) .  She has 3 children .    Education 10 th grade.   Right handed.   Caffeine use: 1 cup coffee/ day               PHYSICAL EXAM  Filed Vitals:   03/09/15 1059  BP: 164/70  Pulse: 76  Height: 5\' 4"  (1.626 m)  Weight: 188 lb (85.276 kg)   Body mass index is 32.25 kg/(m^2).   MMSE - Mini Mental State Exam 03/09/2015 02/18/2015  Orientation to time 4 3  Orientation to Place 3 5  Registration 3 3  Attention/ Calculation 0 0  Recall 2 0  Language- name 2 objects 2 2  Language- repeat 1 1  Language-  follow 3 step command 0 3  Language- read & follow direction 1 1  Write a sentence 0 0  Copy design 0 0  Total score 16 18  Generalized: Well developed, in no acute distress   Neurological examination  Mentation: Alert. Follows all commands speech and language fluent. MMSE 16/30 Cranial nerve II-XII: Pupils were equal round reactive to light. Extraocular movements were full, visual field were full on confrontational test. Facial sensation and strength were normal. Uvula tongue midline. Head turning and shoulder shrug  were normal and symmetric.  Motor: The motor testing reveals 5 over 5 strength of all 4 extremities. Good symmetric motor tone is noted throughout.  Sensory: Sensory testing is intact to soft touch on all 4 extremities. No evidence of extinction is noted.  Coordination: Cerebellar testing reveals good finger-nose-finger and heel-to-shin bilaterally.  Gait and station: Patient did not bring her walker with her today. She is able to stand without assistance. She is unable to ambulate safely without her walker.  Reflexes: Deep tendon reflexes are symmetric and normal bilaterally.   DIAGNOSTIC DATA (LABS, IMAGING, TESTING) - I reviewed patient records, labs, notes, testing and imaging myself where available.  Lab Results  Component Value Date   WBC 4.0 03/02/2015   HGB 9.4* 03/02/2015   HCT 29.7* 03/02/2015   MCV 90.8 03/02/2015   PLT 146* 03/02/2015      Component Value Date/Time   NA 138 03/02/2015 0555   NA 142 02/09/2015 1021   K 3.1* 03/02/2015 0555   CL 106 03/02/2015 0555   CO2 24 03/02/2015 0555   GLUCOSE 110* 03/02/2015 0555   GLUCOSE 106* 02/09/2015 1021   BUN 5* 03/02/2015 0555   BUN 17 02/09/2015 1021   CREATININE 0.56 03/02/2015 0555   CALCIUM 7.9* 03/02/2015 0555   PROT 5.3* 03/02/2015 0555   PROT 6.4 02/09/2015 1021   ALBUMIN 2.8* 03/02/2015 0555   AST 42* 03/02/2015 0555   ALT 22 03/02/2015 0555   ALKPHOS 54 03/02/2015 0555   BILITOT  1.1 03/02/2015 0555   BILITOT 0.9 02/09/2015 1021   GFRNONAA >60 03/02/2015 0555   GFRAA >60 03/02/2015 0555   ASSESSMENT AND PLAN 79 y.o. year old female  has a past medical history of Stroke (2002); Hypertension; Diabetes mellitus (2011); Headache(784.0); Anxiety; Depression; Seizure; History of echocardiogram; Dementia; and UTI (urinary tract infection) (02/2015). here with:  1. Weakness/paralysis left side- TIA? Or Todd's paralysis 2. Memory impairment 3. Seizures  I discussed with the family the risk factors for stroke. The patient is currently on aspirin. She should continue this. The patient's blood pressure was elevated today in office. I have encouraged patient to follow-up with her primary care in regards to her blood pressure. Family states that her blood pressure usually increases at doctor's visits. Patient is currently on Lipitor. I have explained to the patient and the family that if she develops any strokelike symptoms she should go to the emergency room immediately. They verbalized understanding. The patient's memory is stable. MMSE is 16 out of 30 today. The patient has a follow-up visit at the end of the week with her primary care provider. I consulted with Dr. Terrace Arabia after the patient's visit regarding her symptoms. It is unclear if they represent a TIA or possible Todd's paralysis. The patient has been educated on strokelike symptoms. For now we will increase the patient's Keppra- taking one tablet in the morning and 2 tablets at bedtime. If the patient has any additional events they should let us know. If the patient's symptoms worsen or she develops new symptoms she should let us know. She will keep her appointment in October with Dr. Terrace Arabia.  Addendum: I called the patient's daughter and discussed the medication increase. She will increase Keppra to 1 tablet in the morning and 2 tablets at bedtime. Daughter verbalized understanding.     Ward Givens, MSN, NP-C 03/09/2015, 10:59  AM [facility] [address], [place], [place] [address] [phone]

## 2015-03-09 NOTE — Telephone Encounter (Signed)
Called and spoke to patient's husband and he was not sure about medication call Lelon Frohlich daughter in AM

## 2015-03-09 NOTE — Telephone Encounter (Signed)
Daughter Lelon Frohlich [phone] called back regarding medication the nurse advised her of today. She forgot the name of it.

## 2015-03-09 NOTE — Progress Notes (Signed)
I have reviewed and agreed above plan. 

## 2015-03-09 NOTE — Patient Instructions (Addendum)
Continue Seroquel Continue ASA BP is elevated today- follow up with PCP Continue statin and have lipids checked yearly If you have any stroke like symptoms call 911 immediately.  Follow-up with PCP to check for resolution of UTI.   Transient Ischemic Attack A transient ischemic attack (TIA) is a "warning stroke" that causes stroke-like symptoms. Unlike a stroke, a TIA does not cause permanent damage to the brain. The symptoms of a TIA can happen very fast and do not last long. It is important to know the symptoms of a TIA and what to do. This can help prevent a major stroke or death. CAUSES   A TIA is caused by a temporary blockage in an artery in the brain or neck (carotid artery). The blockage does not allow the brain to get the blood supply it needs and can cause different symptoms. The blockage can be caused by either:  A blood clot.  Fatty buildup (plaque) in a neck or brain artery. RISK FACTORS  High blood pressure (hypertension).  High cholesterol.  Diabetes mellitus.  Heart disease.  The build up of plaque in the blood vessels (peripheral artery disease or atherosclerosis).  The build up of plaque in the blood vessels providing blood and oxygen to the brain (carotid artery stenosis).  An abnormal heart rhythm (atrial fibrillation).  Obesity.  Smoking.  Taking oral contraceptives (especially in combination with smoking).  Physical inactivity.  A diet high in fats, salt (sodium), and calories.  Alcohol use.  Use of illegal drugs (especially cocaine and methamphetamine).  Being female.  Being African American.  Being over the age of 70.  Family history of stroke.  Previous history of blood clots, stroke, TIA, or heart attack.  Sickle cell disease. SYMPTOMS  TIA symptoms are the same as a stroke but are temporary. These symptoms usually develop suddenly, or may be newly present upon awakening from sleep:  Sudden weakness or numbness of the face, arm, or  leg, especially on one side of the body.  Sudden trouble walking or difficulty moving arms or legs.  Sudden confusion.  Sudden personality changes.  Trouble speaking (aphasia) or understanding.  Difficulty swallowing.  Sudden trouble seeing in one or both eyes.  Double vision.  Dizziness.  Loss of balance or coordination.  Sudden severe headache with no known cause.  Trouble reading or writing.  Loss of bowel or bladder control.  Loss of consciousness. DIAGNOSIS  Your caregiver may be able to determine the presence or absence of a TIA based on your symptoms, history, and physical exam. Computed tomography (CT scan) of the brain is usually performed to help identify a TIA. Other tests may be done to diagnose a TIA. These tests may include:  Electrocardiography.  Continuous heart monitoring.  Echocardiography.  Carotid ultrasonography.  Magnetic resonance imaging (MRI).  A scan of the brain circulation.  Blood tests. PREVENTION  The risk of a TIA can be decreased by appropriately treating high blood pressure, high cholesterol, diabetes, heart disease, and obesity and by quitting smoking, limiting alcohol, and staying physically active. TREATMENT  Time is of the essence. Since the symptoms of TIA are the same as a stroke, it is important to seek treatment as soon as possible because you may need a medicine to dissolve the clot (thrombolytic) that cannot be given if too much time has passed. Treatment options vary. Treatment options may include rest, oxygen, intravenous (IV) fluids, and medicines to thin the blood (anticoagulants). Medicines and diet may be used to  address diabetes, high blood pressure, and other risk factors. Measures will be taken to prevent short-term and long-term complications, including infection from breathing foreign material into the lungs (aspiration pneumonia), blood clots in the legs, and falls. Treatment options include procedures to either  remove plaque in the carotid arteries or dilate carotid arteries that have narrowed due to plaque. Those procedures are:  Carotid endarterectomy.  Carotid angioplasty and stenting. HOME CARE INSTRUCTIONS   Take all medicines prescribed by your caregiver. Follow the directions carefully. Medicines may be used to control risk factors for a stroke. Be sure you understand all your medicine instructions.  You may be told to take aspirin or the anticoagulant warfarin. Warfarin needs to be taken exactly as instructed.  Taking too much or too little warfarin is dangerous. Too much warfarin increases the risk of bleeding. Too little warfarin continues to allow the risk for blood clots. While taking warfarin, you will need to have regular blood tests to measure your blood clotting time. A PT blood test measures how long it takes for blood to clot. Your PT is used to calculate another value called an INR. Your PT and INR help your caregiver to adjust your dose of warfarin. The dose can change for many reasons. It is critically important that you take warfarin exactly as prescribed.  Many foods, especially foods high in vitamin K can interfere with warfarin and affect the PT and INR. Foods high in vitamin K include spinach, kale, broccoli, cabbage, collard and turnip greens, brussels sprouts, peas, cauliflower, seaweed, and parsley as well as beef and pork liver, green tea, and soybean oil. You should eat a consistent amount of foods high in vitamin K. Avoid major changes in your diet, or notify your caregiver before changing your diet. Arrange a visit with a dietitian to answer your questions.  Many medicines can interfere with warfarin and affect the PT and INR. You must tell your caregiver about any and all medicines you take, this includes all vitamins and supplements. Be especially cautious with aspirin and anti-inflammatory medicines. Do not take or discontinue any prescribed or over-the-counter medicine  except on the advice of your caregiver or pharmacist.  Warfarin can have side effects, such as excessive bruising or bleeding. You will need to hold pressure over cuts for longer than usual. Your caregiver or pharmacist will discuss other potential side effects.  Avoid sports or activities that may cause injury or bleeding.  Be mindful when shaving, flossing your teeth, or handling sharp objects.  Alcohol can change the body's ability to handle warfarin. It is best to avoid alcoholic drinks or consume only very small amounts while taking warfarin. Notify your caregiver if you change your alcohol intake.  Notify your dentist or other caregivers before procedures.  Eat a diet that includes 5 or more servings of fruits and vegetables each day. This may reduce the risk of stroke. Certain diets may be prescribed to address high blood pressure, high cholesterol, diabetes, or obesity.  A low-sodium, low-saturated fat, low-trans fat, low-cholesterol diet is recommended to manage high blood pressure.  A low-saturated fat, low-trans fat, low-cholesterol, and high-fiber diet may control cholesterol levels.  A controlled-carbohydrate, controlled-sugar diet is recommended to manage diabetes.  A reduced-calorie, low-sodium, low-saturated fat, low-trans fat, low-cholesterol diet is recommended to manage obesity.  Maintain a healthy weight.  Stay physically active. It is recommended that you get at least 30 minutes of activity on most or all days.  Do not smoke.  Limit  alcohol use even if you are not taking warfarin. Moderate alcohol use is considered to be:  No more than 2 drinks each day for men.  No more than 1 drink each day for nonpregnant women.  Stop drug abuse.  Home safety. A safe home environment is important to reduce the risk of falls. Your caregiver may arrange for specialists to evaluate your home. Having grab bars in the bedroom and bathroom is often important. Your caregiver may  arrange for equipment to be used at home, such as raised toilets and a seat for the shower.  Follow all instructions for follow-up with your caregiver. This is very important. This includes any referrals and lab tests. Proper follow up can prevent a stroke or another TIA from occurring. SEEK MEDICAL CARE IF:  You have personality changes.  You have difficulty swallowing.  You are seeing double.  You have dizziness.  You have a fever.  You have skin breakdown. SEEK IMMEDIATE MEDICAL CARE IF:  Any of these symptoms may represent a serious problem that is an emergency. Do not wait to see if the symptoms will go away. Get medical help right away. Call your local emergency services (911 in [place] Do not drive yourself to the hospital.  You have sudden weakness or numbness of the face, arm, or leg, especially on one side of the body.  You have sudden trouble walking or difficulty moving arms or legs.  You have sudden confusion.  You have trouble speaking (aphasia) or understanding.  You have sudden trouble seeing in one or both eyes.  You have a loss of balance or coordination.  You have a sudden, severe headache with no known cause.  You have new chest pain or an irregular heartbeat.  You have a partial or total loss of consciousness. MAKE SURE YOU:   Understand these instructions.  Will watch your condition.  Will get help right away if you are not doing well or get worse. Document Released: 04/12/2005 Document Revised: 07/08/2013 Document Reviewed: 10/08/2013 [facility] Patient Information 2015 [place], [place]. This information is not intended to replace advice given to you by your health care provider. Make sure you discuss any questions you have with your health care provider.

## 2015-03-10 NOTE — Telephone Encounter (Signed)
Patient's husband is returning a call regarding the patient's medication.

## 2015-03-11 NOTE — Telephone Encounter (Signed)
Called daughter and left her message asking if she wanted RX picked up or mailed.

## 2015-03-11 NOTE — Telephone Encounter (Signed)
I have ordered this and signed the prescription. Please let the patient know.

## 2015-03-11 NOTE — Telephone Encounter (Signed)
Daughter wants RX mailed. I will mail Commode seat.

## 2015-03-11 NOTE — Telephone Encounter (Addendum)
Called and spoke to patient's daughter explained to her about Keppra one in the morning and two at bed time. Patient's daughter want's RX for bedside folding commode with handles.

## 2015-03-26 ENCOUNTER — Telehealth: Payer: Self-pay | Admitting: Neurology

## 2015-03-26 ENCOUNTER — Encounter: Payer: Self-pay | Admitting: *Deleted

## 2015-03-26 NOTE — Telephone Encounter (Signed)
Spoke to pt's husband - they will attempt this new dosage to see if it is helpful.  Instructed them to call back for any concerns.  Also, left their dgt a message concerning this change.

## 2015-03-26 NOTE — Telephone Encounter (Signed)
Pt's daughter called and states pt will not take her meds, very confrontational, not sleeping very good. Maybe 1-2 hrs a night. Thinks her daughter is her mother. Pt is fearful and thinks people are coming to kill her. Daughter wants to know if medication needs to be increased. Please call daughter , Lelon Frohlich [phone].

## 2015-03-26 NOTE — Telephone Encounter (Signed)
Chart reviewed, patient was started on seroquel 25 mg 1-2 tablets every night since August 2016, please check with family, if seroquel was helpful, may increase the dosage, add on 25 mg during the afternoon, 2 tablets before she goes to bed,

## 2015-04-16 ENCOUNTER — Telehealth: Payer: Self-pay | Admitting: Neurology

## 2015-04-16 MED ORDER — QUETIAPINE FUMARATE 25 MG PO TABS: ORAL_TABLET | ORAL | Status: DC

## 2015-04-16 NOTE — Telephone Encounter (Signed)
Patient's husband is calling and states that his wife's Rx Quetiapine 25 mg dosage was changed by the doctor and increased to 2 tablets in the afternoon and 2 @ bedtime, therefore the patient only has enough medication for tomorrow and according to the old Rx cannot be refilled until 04/19/15.  Could the Rx be changed to accommodate the new dosage so that the patient will not run out.  Thanks!

## 2015-04-16 NOTE — Telephone Encounter (Signed)
Patient's husband called after hours requesting a prescription for 25 mg of edema through fumarate medication. Patient of Dr. [name]. Called the patient's home and was advised that the call had already been answered.

## 2015-04-16 NOTE — Telephone Encounter (Signed)
I have spoken with Mr. Detlefsen this afternoon to clarify how Mrs. Mctigue is taking Zoloft.  At last ov, Dr. [name] noted that she increased Zoloft to 25m in the pm, and 50mg  qhs.  Mr. Taylor confirmed that this is how she is taking Zoloft.  New Rx. sent to Dr. [name] to sign off on/fim

## 2015-04-19 MED ORDER — LEVETIRACETAM 500 MG PO TABS: ORAL_TABLET | ORAL | Status: DC

## 2015-04-19 NOTE — Addendum Note (Signed)
Addended by: Marcial Pacas on: 04/19/2015 09:33 AM   Modules accepted: Orders

## 2015-04-19 NOTE — Telephone Encounter (Signed)
I have refilled her Keppra 500 mg one tablet in morning, 2 tablets every night, 90 tablets with 11 refills.

## 2015-05-11 ENCOUNTER — Ambulatory Visit (INDEPENDENT_AMBULATORY_CARE_PROVIDER_SITE_OTHER): Payer: Medicare Other | Admitting: Neurology

## 2015-05-11 ENCOUNTER — Encounter: Payer: Self-pay | Admitting: Neurology

## 2015-05-11 VITALS — BP 143/78 | HR 77

## 2015-05-11 DIAGNOSIS — F0391 Unspecified dementia with behavioral disturbance: Secondary | ICD-10-CM | POA: Diagnosis not present

## 2015-05-11 DIAGNOSIS — R269 Unspecified abnormalities of gait and mobility: Secondary | ICD-10-CM | POA: Diagnosis not present

## 2015-05-11 DIAGNOSIS — G40909 Epilepsy, unspecified, not intractable, without status epilepticus: Secondary | ICD-10-CM

## 2015-05-11 MED ORDER — QUETIAPINE FUMARATE 25 MG PO TABS: ORAL_TABLET | ORAL | Status: DC

## 2015-05-11 NOTE — Progress Notes (Signed)
PATIENT: Martha Olsen DOB: 24-Dec-1935  Chief Complaint  Patient presents with  . Memory Loss    MMSE 18/30 - 6 animals.  She is here with her husband, Verdon Cummins, and daughter, Dewayne Hatch.  Feels her memory and anxiety is worse.  She has been getting scared and paranoid in the evenings.  Often times, she gets up multiple times during the night.     HISTORICAL  Martha Olsen is a 79 years old right-handed female accompanied by her husband and daughter, seen in refer by her primary care physician Dr.  Elsworth Soho for evaluation of memory loss.  She had a past medical history of depression, seizure, taking Keppra, hyperlipidemia, diabetes, hypertension, history of stroke in Dec 16, 2000 was residual right-sided weakness. She had a history of left shoulder, left hip, left knee replacement in the past. Son passed away with cancer in 12/17/11.  9th education, she was Programmer, applications, she worked at [employer], [employer], retried at age 22, stayed care of grand children.  She denies family history of memory loss. Mother died at age 20 with Stroke , father died of CAD   Larey Seat off bed in her sleep in Oct 2016, with right knee injury.   I saw her initially following her hospital discharge in March 2013,  She has past medical history of HTN, s/p CVA right basal ganglia 12/16/01, dyslipidemia, DM, anxiety/depression, s/p appendectomy, s/p hysterectomy, DJD, s/p left hip replacement 03/2002, left knee replacement 01/2011, foot surgery and reverse left shoulder arthroplasty 2011/12/17.  In October 13 2011, she was at home, sitting at the rocking chair, was noted by her family fell over a chair, had generalized tonic-clonic seizure, patient has no warning signs, no collection of the event, EMS was called, she was taken by ambulance to the hospital, she could not remember waking up in the hospital confused, she was put on Keppra 500 mg twice a day, tolerating it well, there was no recurrent seizure. There was mild elevated  troponin, she was seen by cardiologist, will continue followup  EEG was normal. MRI of the brain has demonstrated old basal ganglion, and coronal radiata stroke, no acute lesions,  She complains of headache everyday, right temporal regions, light noise is bothersome,   She has quit driving since [age], husband retired, she is active at home, house work  UPDATE July 18th 2014:  She is overall doing well, no recurrent seizure, tolerating medications, she has mild gait difficulty due to left shoulder, left hip replacement.  UPDATE Dec 29th 2015:  She has no recurrent seizures, tolerating Keppra 500 mg twice a day without significant side effect, she has increased gait difficulty because of joints, knee pain, She has been complaining of a year history of worsening headaches, bilateral frontal, vortex region, she has to take ibuprofen 200 mg 2-3 tablets each time, sometimes two dosage each day  UPDATE Feb 2nd 2016: She was given prescription of tramadol 50 mg as needed since last visit December 2015, her headache has much improved, taking tramadol about twice each week, she continue has bilateral knee pain, low back pain, gait difficulty, she also has nocturia, frequent awakening, loss snoring, excessive daytime sleepiness, fatigue, sleep studies pending in August 25 2014  ESR, TSH was normal December 2015, mild elevated C-reactive protein 11.  UPDATE February 18 2015: She is accompanied by her husband, and daughter at today's clinical visit,  She had a urgent visit with Dr. Lucia Gaskins in February 09 2015 because of increased confusion,  was found to have UTI, she was given prescription of Augmentin for 5 days, there was no significant change in her confusion  February 09 2015, EEG recording asymmetric slowing of the left hemisphere, with occasionally sharp transient  Patient's continue have slow worsening confusion, agitation, paranoid ideation, difficulty sleeping at nighttime, She had 10 years of  education, worked two jobs most of her life, she retired at age 16, was very active until she had stroke in 2013, was noted to have declining functional status, increased memory trouble, especially since January 2016,  She also has worsening gait difficulty,worsening right knee pain, had a recent evaluation by orthopedic surgeon,she is not a candidate for right knee replacement anymore,because of worsening memory trouble, gradually increased confusion  She also complains of low back pain,worsening bowel and bladder incontinence Update May 11 2015: Her confusion continues with getting worse, agitated, not sleeping well at night time, paranoid, scared, seroquel has been helpful, she is now taking 25 mg 1 in the morning, 1 at 2:00, 2 tablets at night  She is on polypharmacy treatment including Paxil, BuSpar, Ativan as needed, She has no recurrent seizure, taking Keppra 500 mg/1000 mg  REVIEW OF SYSTEMS: Full 14 system review of systems performed and notable only for as above  ALLERGIES: Allergies  Allergen Reactions  . Codeine Hives  . Metformin And Related Diarrhea    HOME MEDICATIONS: Current Outpatient Prescriptions  Medication Sig Dispense Refill  . aspirin 81 MG chewable tablet Chew 1 tablet (81 mg total) by mouth daily. 30 tablet 2  . atorvastatin (LIPITOR) 40 MG tablet Take 40 mg by mouth daily.    . busPIRone (BUSPAR) 15 MG tablet Take 15 mg by mouth 3 (three) times daily.    . carvedilol (COREG) 6.25 MG tablet Take 1 tablet (6.25 mg total) by mouth 2 (two) times daily with a meal. 60 tablet 12  . cefpodoxime (VANTIN) 100 MG tablet Take 1 tablet (100 mg total) by mouth 2 (two) times daily. For 4days 8 tablet 0  . gabapentin (NEURONTIN) 300 MG capsule Take 1 capsule (300 mg total) by mouth at bedtime. 30 capsule 5  . glimepiride (AMARYL) 2 MG tablet Take 2 mg by mouth daily before breakfast.    . ibuprofen (ADVIL,MOTRIN) 200 MG tablet Take 200 mg by mouth every 6 (six) hours as  needed for mild pain.    Marland Kitchen levETIRAcetam (KEPPRA) 500 MG tablet 1 tablet every morning, 2 tablets every night 90 tablet 11  . lisinopril-hydrochlorothiazide (PRINZIDE,ZESTORETIC) 20-12.5 MG per tablet Take 1 tablet by mouth daily.    . metFORMIN (GLUCOPHAGE) 1000 MG tablet Take 1,000 mg by mouth 2 (two) times daily with a meal.   1  . nitroGLYCERIN (NITROSTAT) 0.4 MG SL tablet Place 1 tablet (0.4 mg total) under the tongue every 5 (five) minutes as needed for chest pain. 25 tablet 3  . PARoxetine (PAXIL) 20 MG tablet Take 20 mg by mouth every morning.  0  . PROAIR HFA 108 (90 BASE) MCG/ACT inhaler Inhale 1-2 puffs into the lungs every 6 (six) hours as needed for wheezing or shortness of breath.     . QUEtiapine (SEROQUEL) 25 MG tablet Take one tablet at 2pm, and 2 tablets at bedtime. 90 tablet 1  . ranitidine (ZANTAC) 150 MG tablet Take 150 mg by mouth 2 (two) times daily as needed for heartburn.    . trimethoprim (TRIMPEX) 100 MG tablet Take 100 mg by mouth daily.  1   No  current facility-administered medications for this visit.    PAST MEDICAL HISTORY: Past Medical History  Diagnosis Date  . Stroke [facility]) 2002    right side weakness  . Hypertension   . Diabetes mellitus 2011  . Headache(784.0)   . Anxiety   . Depression   . Seizure (HCC)   . History of echocardiogram     Echo 5/16: EF 55-60%, normal wall motion, grade 1 diastolic dysfunction  . Dementia   . UTI (urinary tract infection) 02/2015    PAST SURGICAL HISTORY: Past Surgical History  Procedure Laterality Date  . Foot surgery    . Cholecystectomy  1993  . Tonsillectomy  1951  . Eye surgery  2009  . Abdominal hysterectomy  1980's  . Appendectomy  1980's  . Joint replacement  2012    Hip, Knee  . Reverse shoulder arthroplasty  12/07/2011    Procedure: REVERSE SHOULDER ARTHROPLASTY;  Surgeon: Senaida Lange, MD;  Location: [facility] OR;  Service: Orthopedics;  Laterality: Left;  left total reverse shoulder    FAMILY  HISTORY: Family History  Problem Relation Age of Onset  . Anesthesia problems Neg Hx   . CAD Father 24  . Stroke Mother 81  . Heart attack Father   . Hypertension Mother     SOCIAL HISTORY:  Social History   Social History  . Marital Status: Married    Spouse Name: Verdon Cummins  . Number of Children: 4  . Years of Education: 9th   Occupational History  . Programmer, applications     Retired   Social History Main Topics  . Smoking status: Never Smoker   . Smokeless tobacco: Never Used  . Alcohol Use: No  . Drug Use: No  . Sexual Activity: Not on file   Other Topics Concern  . Not on file   Social History Narrative   Son recently died of cancer.  Lives at home with husband Baird [facility]) .  She has 3 children .    Education 10 th grade.   Right handed.   Caffeine use: 1 cup coffee/ day              PHYSICAL EXAM   Filed Vitals:   05/11/15 1447  BP: 143/78  Pulse: 77    Not recorded      There is no weight on file to calculate BMI.  PHYSICAL EXAMNIATION:  Gen: NAD, conversant, well nourised, obese, well groomed                     Cardiovascular: Regular rate rhythm, no peripheral edema, warm, nontender. Eyes: Conjunctivae clear without exudates or hemorrhage Neck: Supple, no carotid bruise. Pulmonary: Clear to auscultation bilaterally   NEUROLOGICAL EXAM:  MENTAL STATUS: Speech:    Speech is normal; fluent and spontaneous with normal comprehension.  Cognition: Mini-Mental Status Examination is 18 out of 30     Orientation to time, place and person: She is not oriented to month, day,     Normal recent and remote memory     Attention span and concentration: Difficulties spell world backwards     Normal Language, naming, repeating,spontaneous speech     She has difficulty copy figure  CRANIAL NERVES: CN II: Visual fields are full to confrontation. Pupils are round equal and briskly reactive to light. CN III, IV, VI: extraocular movement are normal. No ptosis. CN V:  Facial sensation is intact to pinprick in all 3 divisions bilaterally. Corneal responses are intact.  CN VII: Face is symmetric with normal eye closure and smile. CN VIII: Hearing is normal to rubbing fingers CN IX, X: Palate elevates symmetrically. Phonation is normal. CN XI: Head turning and shoulder shrug are intact CN XII: Tongue is midline with normal movements and no atrophy.  MOTOR: There is no pronator drift of out-stretched arms. Muscle bulk and tone are normal. Muscle strength is normal.  REFLEXES: Reflexes are 2+ and symmetric at the biceps, triceps, knees, and ankles. Plantar responses are flexor.  SENSORY: Intact to light touch, pinprick, position sense, and vibration sense are intact in fingers and toes.  COORDINATION: Rapid alternating movements and fine finger movements are intact. There is no dysmetria on finger-to-nose and heel-knee-shin.    GAIT/STANCE: She need assistant to get up from seated position, limp, dragging her left leg  DIAGNOSTIC DATA (LABS, IMAGING, TESTING) - I reviewed patient records, labs, notes, testing and imaging myself where available.   ASSESSMENT AND PLAN  JAANVI FIZER is a 79 y.o. female   Dementia with behavior issues  Titrating up her seroquel to 25 mg 4-5 tablets each day  Stop Paxil  Ativan as needed Complex partial seizure  Doing well on Keppra 500 mg in the morning, 2 tablets at night   Marcial Pacas, M.D. Ph.D.  [facility] [address], [place], [place] [address] Ph: [phone] Fax: [phone]  CC: L.Dean Alroy Dust, MD

## 2015-05-12 ENCOUNTER — Telehealth: Payer: Self-pay | Admitting: Neurology

## 2015-05-12 DIAGNOSIS — F03918 Unspecified dementia, unspecified severity, with other behavioral disturbance: Secondary | ICD-10-CM

## 2015-05-12 DIAGNOSIS — F0391 Unspecified dementia with behavioral disturbance: Secondary | ICD-10-CM

## 2015-05-12 NOTE — Telephone Encounter (Signed)
I have spoken to the daughter and will call the pharmacy on her behalf.

## 2015-05-12 NOTE — Telephone Encounter (Signed)
[name] called to advise patient was here yesterday and Dr. [name] changed QUEtiapine (SEROQUEL) 25 MG tablet from 2 pills at bedtime to 3 pills at bedtime. When they picked up the Rx at the pharmacy it's 2 at bedtime Quantity 90. Concern of how much to give her? And they don't want to run out of the medication. Please call [name] [phone].

## 2015-05-13 MED ORDER — QUETIAPINE FUMARATE 25 MG PO TABS: ORAL_TABLET | ORAL | Status: DC

## 2015-05-13 NOTE — Telephone Encounter (Signed)
Crystal with [facility] [employer] called- PA approved effective 05/13/15-05/12/16.

## 2015-05-13 NOTE — Telephone Encounter (Addendum)
Ok per Dr. [name], to provide patient with new rx for Seroquel 25mg , 1 tablet in am, 1 tablet at 2pm and 2-3 at bedtime.  Verbal orders to increase to #150/30 days with 11 refills.

## 2015-05-13 NOTE — Telephone Encounter (Signed)
New rx has been sent to the pharmacy - quantity override has been approved - patient's daughter, Martha Olsen, will go to the pharmacy and speak with Juliann Pulse (pharmacist on duty) to get the prescription corrected and the remaining tablets needed.

## 2015-05-13 NOTE — Telephone Encounter (Signed)
Prior Martha Olsen has been approved effective until 05/12/2016 Ref # [id number]

## 2015-05-13 NOTE — Telephone Encounter (Signed)
[employer] has been contacted and provided with clinical info, asking that they grant a Quantity Limit Exception (they currently only cover #90 per 30 days).  Request has been marked Urgent, and is under review.  Ref # [id number]

## 2015-06-24 ENCOUNTER — Telehealth: Payer: Self-pay | Admitting: Neurology

## 2015-06-24 NOTE — Telephone Encounter (Signed)
Spoke to Martha Olsen - her mother is taking Seroquel 25, 1 tab in am, 1 tab at 2pm and 3 tabs at bedtime. She is also on Buspar.  She has not tried giving the Ativan 2mg , prescribed to her as needed.  I encouraged her to try the Ativan prn when she has these outbursts to help calm her.  I told her to please call back if the outbursts worsen or if the medication is not helpful or for any other concerns.  Discussed plan with Dr. [name], she may consider changing Keppra at her next appointment if symptoms do not improve.  I told Lelon Frohlich we are always happy to work her in earlier.

## 2015-06-24 NOTE — Telephone Encounter (Signed)
Daughter Lelon Frohlich called regarding changes over the past 3 weeks, real depressed, bad outbursts, she says "ya'll don't care anything about me, why don't ya'll just leave", "I wish dementia would just eat my brain and go ahead and kill me now", "I just want to lay down and die", "I'm not taking that medicine anymore, I just want to die", when she calms down she says "I'm going to try real hard tomorrow to be good", she cries herself to sleep. Changes seem to continually get worse.

## 2015-08-11 ENCOUNTER — Ambulatory Visit (INDEPENDENT_AMBULATORY_CARE_PROVIDER_SITE_OTHER): Payer: Medicare Other | Admitting: Neurology

## 2015-08-11 ENCOUNTER — Encounter: Payer: Self-pay | Admitting: Neurology

## 2015-08-11 VITALS — BP 126/82 | HR 75

## 2015-08-11 DIAGNOSIS — I6300 Cerebral infarction due to thrombosis of unspecified precerebral artery: Secondary | ICD-10-CM | POA: Diagnosis not present

## 2015-08-11 DIAGNOSIS — G40909 Epilepsy, unspecified, not intractable, without status epilepticus: Secondary | ICD-10-CM

## 2015-08-11 DIAGNOSIS — F0391 Unspecified dementia with behavioral disturbance: Secondary | ICD-10-CM | POA: Diagnosis not present

## 2015-08-11 DIAGNOSIS — F03918 Unspecified dementia, unspecified severity, with other behavioral disturbance: Secondary | ICD-10-CM

## 2015-08-11 MED ORDER — QUETIAPINE FUMARATE 25 MG PO TABS: ORAL_TABLET | ORAL | Status: DC

## 2015-08-11 MED ORDER — SERTRALINE HCL 50 MG PO TABS: 50.0000 mg | ORAL_TABLET | Freq: Every day | ORAL | Status: DC

## 2015-08-11 MED ORDER — LEVETIRACETAM 500 MG PO TABS: ORAL_TABLET | ORAL | Status: DC

## 2015-08-11 NOTE — Progress Notes (Signed)
Chief Complaint  Patient presents with  . Martha Olsen    MMSE 29/30 - 5 animals.  She is here with her husband, Denyse Amass and daughter, Lelon Frohlich.  They have concerns over her not sleeping well and her worsening depression.  . Seizures    Reports no seizure activity.      PATIENT: Martha Olsen DOB: 12-31-1935  Chief Complaint  Patient presents with  . Martha Olsen    MMSE 29/30 - 5 animals.  She is here with her husband, Denyse Amass and daughter, Lelon Frohlich.  They have concerns over her not sleeping well and her worsening depression.  . Seizures    Reports no seizure activity.     HISTORICAL  Martha Olsen is a 80 years old right-handed female accompanied by her husband and daughter, seen in refer by her primary care physician Dr.  Donavan Burnet for evaluation of memory loss.  She has past medical history of HTN, s/p CVA right basal ganglia 11/2001, dyslipidemia, DM, anxiety/depression, s/p appendectomy, s/p hysterectomy, DJD, s/p left hip replacement 03/2002, left knee replacement 01/2011, foot surgery and reverse left shoulder arthroplasty 11/2011.  She had 9 years of education, she was a Chartered certified accountant, she worked at [employer], [employer], retried at age [employer] care of her grandchildren.  She denies family history of memory loss. Mother died at age 53 from stroke.  I saw her initially following her hospital discharge in March 2013,  In October 13 2011, she was at home, sitting at the rocking chair, was noted by her family fell over the chair, had generalized tonic-clonic seizure, patient has no warning signs, no collection of the event, EMS was called, she was taken by ambulance to the hospital, she could not remember waking up in the hospital confused, she was put on Keppra 500 mg twice a day, tolerating it well, there was no recurrent seizure. There was mild elevated troponin, she was seen by cardiologist, will continue followup  EEG was normal. MRI of the brain has demonstrated old basal ganglion, and  coronal radiata stroke, no acute lesions,  She complains of headache everyday, right temporal regions, light noise is bothersome,   She has quit driving since 6761, husband retired, she is active at home, house work  UPDATE July 18th 2014:  She is overall doing well, no recurrent seizure, tolerating keppra '500mg'$  bid, she has mild gait difficulty due to left shoulder, left hip replacement.  UPDATE Dec 29th 2015:  She has no recurrent seizures, tolerating Keppra 500 mg twice a day without significant side effect, she has increased gait difficulty because of joints, knee pain, She has been complaining of a year history of worsening headaches, bilateral frontal, vortex region, she has to take ibuprofen 200 mg 2-3 tablets each time, sometimes two dosage each day  UPDATE Feb 2nd 2016: She was given prescription of tramadol 50 mg as needed since last visit December 2015, her headache has much improved, taking tramadol about twice each week, she continue has bilateral knee pain, low back pain, gait difficulty, she also has nocturia, frequent awakening, loss snoring, excessive daytime sleepiness, fatigue, sleep studies pending in August 25 2014  ESR, TSH was normal December 2015, mild elevated C-reactive protein 11.  UPDATE February 18 2015: She is accompanied by her husband, and daughter at today's clinical visit,  She had a urgent visit with Dr. Jaynee Eagles in February 09 2015 because of increased confusion, was found to have UTI, she was given prescription of Augmentin for 5 days, there  was no significant change in her confusion  February 09 2015, EEG recording asymmetric slowing of the left hemisphere, with occasionally sharp transient  Patient's continue have slow worsening confusion, agitation, paranoid ideation, difficulty sleeping at nighttime, She had 10 years of education, worked two jobs most of her life, she retired at age 21, was very active until she had stroke in 2013, was noted to have declining  functional status, increased memory trouble, especially since January 2016,  She also has worsening gait difficulty,worsening right knee pain, had a recent evaluation by orthopedic surgeon,she is not a candidate for right knee replacement anymore,because of worsening memory trouble, gradually increased confusion  She also complains of low back pain,worsening bowel and bladder incontinence Update May 11 2015: Her confusion continues with getting worse, she is more agitated, could not well at night time, paranoid, scared, seroquel has been helpful, she is now taking 25 mg 1 in the morning, 1 at 2:00, 2 tablets at night  She is on polypharmacy treatment including Paxil, BuSpar, Ativan as needed, She has no recurrent seizure, taking Keppra 500 mg/1000 mg  UPDATE Aug 11 2015: She is now taking seroquel '25mg'$  3 tabs qhs,ativan prn, but she still has trouble sleeping.   She is seeing psychiatrist, taking BuSpar, she has been dealing with depression for a long time. She was previously treated as SSRI, which has been helpful  She has no recurrent seizure, taking Keppra 500/1000 mg daily  REVIEW OF SYSTEMS: Full 14 system review of systems performed and notable only for as above  ALLERGIES: Allergies  Allergen Reactions  . Codeine Hives  . Metformin And Related Diarrhea    HOME MEDICATIONS: Current Outpatient Prescriptions  Medication Sig Dispense Refill  . aspirin 81 MG chewable tablet Chew 1 tablet (81 mg total) by mouth daily. 30 tablet 2  . atorvastatin (LIPITOR) 40 MG tablet Take 40 mg by mouth daily.    . busPIRone (BUSPAR) 15 MG tablet Take 15 mg by mouth 3 (three) times daily.    . carvedilol (COREG) 6.25 MG tablet Take 1 tablet (6.25 mg total) by mouth 2 (two) times daily with a meal. 60 tablet 12  . glimepiride (AMARYL) 2 MG tablet Take 2 mg by mouth daily before breakfast.    . ibuprofen (ADVIL,MOTRIN) 200 MG tablet Take 200 mg by mouth every 6 (six) hours as needed for mild  pain.    Marland Kitchen levETIRAcetam (KEPPRA) 500 MG tablet 1 tablet every morning, 2 tablets every night 90 tablet 11  . lisinopril-hydrochlorothiazide (PRINZIDE,ZESTORETIC) 20-12.5 MG per tablet Take 1 tablet by mouth daily.    Marland Kitchen LORazepam (ATIVAN) 2 MG tablet Take 2 mg by mouth every 6 (six) hours as needed.    . metFORMIN (GLUCOPHAGE) 1000 MG tablet Take 1,000 mg by mouth 2 (two) times daily with a meal.   1  . nitroGLYCERIN (NITROSTAT) 0.4 MG SL tablet Place 1 tablet (0.4 mg total) under the tongue every 5 (five) minutes as needed for chest pain. 25 tablet 3  . PROAIR HFA 108 (90 BASE) MCG/ACT inhaler Inhale 1-2 puffs into the lungs every 6 (six) hours as needed for wheezing or shortness of breath.     . QUEtiapine (SEROQUEL) 25 MG tablet Take one tablet in the morning, one at 2pm, and 2-3 tablets at bedtime. 150 tablet 11  . ranitidine (ZANTAC) 150 MG tablet Take 150 mg by mouth 2 (two) times daily as needed for heartburn.    . trimethoprim (TRIMPEX) 100 MG  tablet Take 100 mg by mouth daily.  1   No current facility-administered medications for this visit.    PAST MEDICAL HISTORY: Past Medical History  Diagnosis Date  . Stroke [facility]) 2002    right side weakness  . Hypertension   . Diabetes mellitus 2011  . Headache(784.0)   . Anxiety   . Depression   . Seizure ([place])   . History of echocardiogram     Echo 5/16: EF 55-60%, normal wall motion, grade 1 diastolic dysfunction  . Dementia   . UTI (urinary tract infection) 02/2015    PAST SURGICAL HISTORY: Past Surgical History  Procedure Laterality Date  . Foot surgery    . Cholecystectomy  1993  . Tonsillectomy  1951  . Eye surgery  2009  . Abdominal hysterectomy  1980's  . Appendectomy  1980's  . Joint replacement  2012    Hip, Knee  . Reverse shoulder arthroplasty  12/07/2011    Procedure: REVERSE SHOULDER ARTHROPLASTY;  Surgeon: Marin Shutter, MD;  Location: [place];  Service: Orthopedics;  Laterality: Left;  left total reverse shoulder     FAMILY HISTORY: Family History  Problem Relation Age of Onset  . Anesthesia problems Neg Hx   . CAD Father 71  . Stroke Mother 62  . Heart attack Father   . Hypertension Mother     SOCIAL HISTORY:  Social History   Social History  . Marital Status: Married    Spouse Name: Denyse Amass  . Number of Children: 4  . Years of Education: 9th   Occupational History  . Chartered certified accountant     Retired   Social History Main Topics  . Smoking status: Never Smoker   . Smokeless tobacco: Never Used  . Alcohol Use: No  . Drug Use: No  . Sexual Activity: Not on file   Other Topics Concern  . Not on file   Social History Narrative   Son recently died of cancer.  Lives at home with husband Thomes Cake) .  She has 3 children .    Education 10 th grade.   Right handed.   Caffeine use: 1 cup coffee/ day              PHYSICAL EXAM   Filed Vitals:   08/11/15 0904  BP: 126/82  Pulse: 75    Not recorded      There is no weight on file to calculate BMI.  PHYSICAL EXAMNIATION:  Gen: NAD, conversant, well nourised, obese, well groomed                     Cardiovascular: Regular rate rhythm, no peripheral edema, warm, nontender. Eyes: Conjunctivae clear without exudates or hemorrhage Neck: Supple, no carotid bruise. Pulmonary: Clear to auscultation bilaterally   NEUROLOGICAL EXAM:  MENTAL STATUS: Speech:    Speech is normal; fluent and spontaneous with normal comprehension.  Cognition: Mini-Mental Status Examination is 29  out of 30, animal naming was 5      Orientation to time, place and person: She is not oriented to month, day,     Normal recent and remote memory     Attention span and concentration: Difficulties spell world backwards     Normal Language, naming, repeating,spontaneous speech     She has difficulty copy figure  CRANIAL NERVES: CN II: Visual fields are full to confrontation. Pupils are round equal and briskly reactive to light. CN III, IV, VI: extraocular  movement are normal. No ptosis.  CN V: Facial sensation is intact to pinprick in all 3 divisions bilaterally. Corneal responses are intact.  CN VII: Face is symmetric with normal eye closure and smile. CN VIII: Hearing is normal to rubbing fingers CN IX, X: Palate elevates symmetrically. Phonation is normal. CN XI: Head turning and shoulder shrug are intact CN XII: Tongue is midline with normal movements and no atrophy.  MOTOR: There is no pronator drift of out-stretched arms. Muscle bulk and tone are normal. Muscle strength is normal.  REFLEXES: Reflexes are 2+ and symmetric at the biceps, triceps, knees, and ankles. Plantar responses are flexor.  SENSORY:  length dependent decreased toght touch, pinprick and vibratory sensation to ankle level  COORDINATION: Rapid alternating movements and fine finger movements are intact. There is no dysmetria on finger-to-nose and heel-knee-shin.    GAIT/STANCE: She need assistant to get up from seated position, limp, dragging her left leg, cautious unsteady   DIAGNOSTIC DATA (LABS, IMAGING, TESTING) - I reviewed patient records, labs, notes, testing and imaging myself where available.   ASSESSMENT AND PLAN  VENNELA JUTTE is a 80 y.o. female   Dementia with behavior issues  Titrating up her seroquel to 25 mg 5 tablets each day, refilled her medications  Ativan as needed Worsening depression insomnia    I have added on Zoloft 50 mg daily, she should continue follow-up with her psychologist   Complex partial seizure  Doing well on Keppra 500 mg in the morning, 2 tablets at night   Marcial Pacas, M.D. Ph.D.  [facility] [address] [place], [place] [address] Ph: [phone] Fax: [phone]  CC: L.Dean Alroy Dust, MD

## 2015-08-31 ENCOUNTER — Telehealth: Payer: Self-pay | Admitting: Neurology

## 2015-08-31 DIAGNOSIS — F332 Major depressive disorder, recurrent severe without psychotic features: Secondary | ICD-10-CM | POA: Diagnosis not present

## 2015-08-31 NOTE — Telephone Encounter (Signed)
Pt's daughter called said pt went to Dr Silvio Pate (pshcy) and she d/c QUEtiapine (SEROQUEL) 25 MG tablet and started Aricept 10mg  today. She left pt on zoloft and buspar, and pt was instructed to take LORazepam (ATIVAN) 2 MG tablet everyday. Daughter is very confused, said she did not like that provider and needs to know if Dr [name] agrees with change in pt's medications. She said provider told pt she should not be depressed that seizures keep depression down.

## 2015-08-31 NOTE — Telephone Encounter (Signed)
Martha Olsen is concerned about the medication changes made by the patient's psychiatrist and would like to know if you agree, especially since some of the changes affected the medications you prescribe.

## 2015-09-01 NOTE — Telephone Encounter (Signed)
Pt's daughter called and would like a call back from [place]. She never heard from Dr. [name] and thought the nurse would know something more now. Please call and advise

## 2015-09-01 NOTE — Telephone Encounter (Signed)
Spoke to [place] - she doesn't want to make any changes until she speaks with Dr. [name].

## 2015-09-01 NOTE — Telephone Encounter (Addendum)
I talked with her daughter Lelon Frohlich, she was seen by her psychiatrist Dr. Silvio Pate, made some adjustment on her medications.  She has stop seroquel (she was taking seroquel 25mg  1/1/3), daughter reported seroquel does calm down her mother,   Change Ativan to 2mg  qhs, instead of as needed, she was getting it couple times a week if she is not sleeping well, Aricept 10mg  was added on Buspar 15mg , zoloft 50mg  were kept the same  It is ok to go with the changes by her psychiatrist, I have advised her daughter to carefully observe mother, if she has worsening agitation, depression, may continue work with her psychologist for medication adjustment

## 2015-09-13 ENCOUNTER — Encounter: Payer: Self-pay | Admitting: *Deleted

## 2015-09-13 NOTE — Telephone Encounter (Signed)
Daughter Lelon Frohlich [phone] called to advise patient is having a reaction to the medication that was prescribed by Dr. Silvio Pate (ARICEPT), states Dr. [name] advised to call if there were any problems.

## 2015-09-13 NOTE — Telephone Encounter (Signed)
Dr. Silvio Pate prescribed donepezil - patient initially started having diarrhea with the addition of this medication but it has now improved.  She is still not sleeping very well. She has a pending appt with Dr. Andris Baumann office and will keep up updated on any medication changes.

## 2015-09-14 DIAGNOSIS — Z7984 Long term (current) use of oral hypoglycemic drugs: Secondary | ICD-10-CM | POA: Diagnosis not present

## 2015-09-14 DIAGNOSIS — M150 Primary generalized (osteo)arthritis: Secondary | ICD-10-CM | POA: Diagnosis not present

## 2015-09-14 DIAGNOSIS — E1142 Type 2 diabetes mellitus with diabetic polyneuropathy: Secondary | ICD-10-CM | POA: Diagnosis not present

## 2015-09-14 DIAGNOSIS — F411 Generalized anxiety disorder: Secondary | ICD-10-CM | POA: Diagnosis not present

## 2015-09-14 DIAGNOSIS — I10 Essential (primary) hypertension: Secondary | ICD-10-CM | POA: Diagnosis not present

## 2015-09-14 DIAGNOSIS — E7800 Pure hypercholesterolemia, unspecified: Secondary | ICD-10-CM | POA: Diagnosis not present

## 2015-09-15 DIAGNOSIS — N3020 Other chronic cystitis without hematuria: Secondary | ICD-10-CM | POA: Diagnosis not present

## 2015-09-15 DIAGNOSIS — N3946 Mixed incontinence: Secondary | ICD-10-CM | POA: Diagnosis not present

## 2015-09-20 ENCOUNTER — Telehealth: Payer: Self-pay | Admitting: Neurology

## 2015-09-20 NOTE — Telephone Encounter (Signed)
Pt's daughter called pt seen at psychiatrist Dr Margarita Mail about 2 wks ago. Psych discontinued namenda on Friday 09/17/15 because pt was itching which has subsided some.   Daughters sts Dr Margarita Mail discontinue quetiapine 2 weeks ago and replaced it with aricept but on Friday 09/17/15 she discontinued the aricept because pt was itching . Pt is not sleeping well, she sleeps for about 2 hrs and awake for 3 hrs. Daughter is wanting Dr [name] to address the sleep and memory issues. Dr Margarita Mail said to contact Dr [name] because she will be out of town for 3 months on a mission trip

## 2015-09-20 NOTE — Telephone Encounter (Signed)
Spoke to [place] (daughter on [employer]) - she would like her mother to come in for an early follow up appt.  She would like our office to manage her memory problems and medications.  Her follow up was with [facility] in July and has been moved to 3/717.  Lelon Frohlich is aware to have her here at 8:45am.

## 2015-09-21 ENCOUNTER — Ambulatory Visit (INDEPENDENT_AMBULATORY_CARE_PROVIDER_SITE_OTHER): Payer: Medicare Other | Admitting: Adult Health

## 2015-09-21 ENCOUNTER — Encounter: Payer: Self-pay | Admitting: Adult Health

## 2015-09-21 VITALS — Ht 64.0 in

## 2015-09-21 DIAGNOSIS — R413 Other amnesia: Secondary | ICD-10-CM

## 2015-09-21 DIAGNOSIS — G4700 Insomnia, unspecified: Secondary | ICD-10-CM

## 2015-09-21 DIAGNOSIS — F329 Major depressive disorder, single episode, unspecified: Secondary | ICD-10-CM | POA: Diagnosis not present

## 2015-09-21 DIAGNOSIS — F32A Depression, unspecified: Secondary | ICD-10-CM

## 2015-09-21 MED ORDER — MEMANTINE HCL 28 X 5 MG & 21 X 10 MG PO TABS: ORAL_TABLET | ORAL | Status: DC

## 2015-09-21 MED ORDER — SERTRALINE HCL 50 MG PO TABS: 75.0000 mg | ORAL_TABLET | Freq: Every day | ORAL | Status: DC

## 2015-09-21 NOTE — Patient Instructions (Signed)
Increase Zoloft to 75 mg (1.5 tablets). Continue Keppra Begin namenda If your symptoms worsen or you develop new symptoms please let us know.

## 2015-09-21 NOTE — Progress Notes (Signed)
I have reviewed and agreed above plan. 

## 2015-09-21 NOTE — Progress Notes (Signed)
PATIENT: Martha Olsen DOB: 1936-06-02  REASON FOR VISIT: follow up- memory, insomnia HISTORY FROM: patient  HISTORY OF PRESENT ILLNESS: UPDATE 09/21/15: Ms. Carnathan is a 80 year old female with a history of memory disorder and insomnia. She returns today for an evaluation. The patient did follow up with a psychiatrist. She was taken off of Seroquel. The patient states that she continues to have depression-- she has remained on Zoloft 50 mg daily. The patient has a hard time sleeping at night. However the Seroquel did not give her much benefit either. She was on Aricept for her memory but this caused itching and therefore the medication was discontinued by her psychiatrist. The patient is able to complete most ADLs independently. She lives at home with her husband. The patient is unable to get out of the house much because she does not drive and her husband is not able. She does feel that this may contribute to her depression. She denies any new neurological symptoms. She returns today for an evaluation.  HISTORY (YAN):  [name] is a 80 years old right-handed female accompanied by her husband and daughter, seen in refer by her primary care physician Dr. Donavan Burnet for evaluation of memory loss.  She has past medical history of HTN, s/p CVA right basal ganglia 11/2001, dyslipidemia, DM, anxiety/depression, s/p appendectomy, s/p hysterectomy, DJD, s/p left hip replacement 03/2002, left knee replacement 01/2011, foot surgery and reverse left shoulder arthroplasty 11/2011.  She had 9 years of education, she was a Chartered certified accountant, she worked at [employer], [employer], retried at age [place] care of her grandchildren. She denies family history of memory loss. Mother died at age 62 from stroke.  I saw her initially following her hospital discharge in March 2013,  In October 13 2011, she was at home, sitting at the rocking chair, was noted by her family fell over the chair, had generalized  tonic-clonic seizure, patient has no warning signs, no collection of the event, EMS was called, she was taken by ambulance to the hospital, she could not remember waking up in the hospital confused, she was put on Keppra 500 mg twice a day, tolerating it well, there was no recurrent seizure. There was mild elevated troponin, she was seen by cardiologist, will continue followup  EEG was normal. MRI of the brain has demonstrated old basal ganglion, and coronal radiata stroke, no acute lesions,  She complains of headache everyday, right temporal regions, light noise is bothersome,   She has quit driving since [id number], husband retired, she is active at home, house work  UPDATE July 18th 2014:  She is overall doing well, no recurrent seizure, tolerating keppra '500mg'$  bid, she has mild gait difficulty due to left shoulder, left hip replacement.  UPDATE Dec 29th 2015:  She has no recurrent seizures, tolerating Keppra 500 mg twice a day without significant side effect, she has increased gait difficulty because of joints, knee pain, She has been complaining of a year history of worsening headaches, bilateral frontal, vortex region, she has to take ibuprofen 200 mg 2-3 tablets each time, sometimes two dosage each day  UPDATE Feb 2nd 2016: She was given prescription of tramadol 50 mg as needed since last visit December 2015, her headache has much improved, taking tramadol about twice each week, she continue has bilateral knee pain, low back pain, gait difficulty, she also has nocturia, frequent awakening, loss snoring, excessive daytime sleepiness, fatigue, sleep studies pending in August 25 2014  ESR, TSH  was normal December 2015, mild elevated C-reactive protein 11.  UPDATE February 18 2015: She is accompanied by her husband, and daughter at today's clinical visit,  She had a urgent visit with Dr. Jaynee Eagles in February 09 2015 because of increased confusion, was found to have UTI, she was given prescription of  Augmentin for 5 days, there was no significant change in her confusion  February 09 2015, EEG recording asymmetric slowing of the left hemisphere, with occasionally sharp transient  Patient's continue have slow worsening confusion, agitation, paranoid ideation, difficulty sleeping at nighttime, She had 10 years of education, worked two jobs most of her life, she retired at age 67, was very active until she had stroke in 2013, was noted to have declining functional status, increased memory trouble, especially since January 2016,  She also has worsening gait difficulty,worsening right knee pain, had a recent evaluation by orthopedic surgeon,she is not a candidate for right knee replacement anymore,because of worsening memory trouble, gradually increased confusion  She also complains of low back pain,worsening bowel and bladder incontinence Update May 11 2015: Her confusion continues with getting worse, she is more agitated, could not well at night time, paranoid, scared, seroquel has been helpful, she is now taking 25 mg 1 in the morning, 1 at 2:00, 2 tablets at night  She is on polypharmacy treatment including Paxil, BuSpar, Ativan as needed, She has no recurrent seizure, taking Keppra 500 mg/1000 mg  UPDATE Aug 11 2015: She is now taking seroquel '25mg'$  3 tabs qhs,ativan prn, but she still has trouble sleeping. She is seeing psychiatrist, taking BuSpar, she has been dealing with depression for a long time. She was previously treated as SSRI, which has been helpful  She has no recurrent seizure, taking Keppra 500/1000 mg daily   REVIEW OF SYSTEMS: Out of a complete 14 system review of symptoms, the patient complains only of the following symptoms, and all other reviewed systems are negative.  See history of present illness  ALLERGIES: Allergies  Allergen Reactions  . Codeine Hives    HOME MEDICATIONS: Outpatient Prescriptions Prior to Visit  Medication Sig Dispense Refill  . aspirin  81 MG chewable tablet Chew 1 tablet (81 mg total) by mouth daily. 30 tablet 2  . atorvastatin (LIPITOR) 40 MG tablet Take 40 mg by mouth daily.    . busPIRone (BUSPAR) 15 MG tablet Take 15 mg by mouth 3 (three) times daily.    . carvedilol (COREG) 6.25 MG tablet Take 1 tablet (6.25 mg total) by mouth 2 (two) times daily with a meal. 60 tablet 12  . donepezil (ARICEPT) 10 MG tablet Take 10 mg by mouth at bedtime.    Marland Kitchen glimepiride (AMARYL) 2 MG tablet Take 2 mg by mouth daily before breakfast.    . ibuprofen (ADVIL,MOTRIN) 200 MG tablet Take 200 mg by mouth every 6 (six) hours as needed for mild pain.    Marland Kitchen levETIRAcetam (KEPPRA) 500 MG tablet 1 tablet every morning, 2 tablets every night 90 tablet 11  . lisinopril-hydrochlorothiazide (PRINZIDE,ZESTORETIC) 20-12.5 MG per tablet Take 1 tablet by mouth daily.    Marland Kitchen LORazepam (ATIVAN) 2 MG tablet Take 2 mg by mouth at bedtime.     . metFORMIN (GLUCOPHAGE) 1000 MG tablet Take 1,000 mg by mouth 2 (two) times daily with a meal.   1  . nitroGLYCERIN (NITROSTAT) 0.4 MG SL tablet Place 1 tablet (0.4 mg total) under the tongue every 5 (five) minutes as needed for chest pain. 25 tablet  3  . PROAIR HFA 108 (90 BASE) MCG/ACT inhaler Inhale 1-2 puffs into the lungs every 6 (six) hours as needed for wheezing or shortness of breath.     . ranitidine (ZANTAC) 150 MG tablet Take 150 mg by mouth 2 (two) times daily as needed for heartburn.    . sertraline (ZOLOFT) 50 MG tablet Take 1 tablet (50 mg total) by mouth daily. 30 tablet 11  . trimethoprim (TRIMPEX) 100 MG tablet Take 100 mg by mouth daily.  1   No facility-administered medications prior to visit.    PAST MEDICAL HISTORY: Past Medical History  Diagnosis Date  . Stroke [facility]) 2002    right side weakness  . Hypertension   . Diabetes mellitus 2011  . Headache(784.0)   . Anxiety   . Depression   . Seizure ([place])   . History of echocardiogram     Echo 5/16: EF 55-60%, normal wall motion, grade 1 diastolic  dysfunction  . Dementia   . UTI (urinary tract infection) 02/2015    PAST SURGICAL HISTORY: Past Surgical History  Procedure Laterality Date  . Foot surgery    . Cholecystectomy  1993  . Tonsillectomy  1951  . Eye surgery  2009  . Abdominal hysterectomy  1980's  . Appendectomy  1980's  . Joint replacement  2012    Hip, Knee  . Reverse shoulder arthroplasty  12/07/2011    Procedure: REVERSE SHOULDER ARTHROPLASTY;  Surgeon: Marin Shutter, MD;  Location: [place];  Service: Orthopedics;  Laterality: Left;  left total reverse shoulder    FAMILY HISTORY: Family History  Problem Relation Age of Onset  . Anesthesia problems Neg Hx   . CAD Father 101  . Stroke Mother 56  . Heart attack Father   . Hypertension Mother     SOCIAL HISTORY: Social History   Social History  . Marital Status: Married    Spouse Name: Denyse Amass  . Number of Children: 4  . Years of Education: 9th   Occupational History  . Chartered certified accountant     Retired   Social History Main Topics  . Smoking status: Never Smoker   . Smokeless tobacco: Never Used  . Alcohol Use: No  . Drug Use: No  . Sexual Activity: Not on file   Other Topics Concern  . Not on file   Social History Narrative   Son recently died of cancer.  Lives at home with husband Thomes Cake) .  She has 3 children .    Education 10 th grade.   Right handed.   Caffeine use: 1 cup coffee/ day               PHYSICAL EXAM  Filed Vitals:   09/21/15 0858  Height: '5\' 4"'$  (1.626 m)   There is no weight on file to calculate BMI.  MMSE - Mini Mental State Exam 09/21/2015 08/11/2015 05/11/2015  Orientation to time '3 5 3  '$ Orientation to Place '4 5 4  '$ Registration '3 3 3  '$ Attention/ Calculation 0 5 0  Recall 3 3 0  Language- name 2 objects '2 2 2  '$ Language- repeat '1 1 1  '$ Language- follow 3 step command '3 3 3  '$ Language- read & follow direction '1 1 1  '$ Write a sentence '1 1 1  '$ Copy design 0 0 0  Total score '21 29 18     '$ Generalized: Well developed, in  no acute distress   Neurological examination  Mentation: Alert oriented to time,  place, history taking. Follows all commands speech and language fluent Cranial nerve II-XII: Pupils were equal round reactive to light. Extraocular movements were full, visual field were full on confrontational test. Facial sensation and strength were normal. Uvula tongue midline. Head turning and shoulder shrug  were normal and symmetric. Motor: The motor testing reveals 5 over 5 strength of all 4 extremities. Good symmetric motor tone is noted throughout.  Sensory: Sensory testing is intact to soft touch on all 4 extremities. No evidence of extinction is noted.  Coordination: Cerebellar testing reveals good finger-nose-finger and heel-to-shin bilaterally.  Gait and station: Patient is in a wheelchair today. She uses a wheelchair for longer distances. Reflexes: Deep tendon reflexes are symmetric and normal bilaterally.   DIAGNOSTIC DATA (LABS, IMAGING, TESTING) - I reviewed patient records, labs, notes, testing and imaging myself where available.  Lab Results  Component Value Date   WBC 4.0 03/02/2015   HGB 9.4* 03/02/2015   HCT 29.7* 03/02/2015   MCV 90.8 03/02/2015   PLT 146* 03/02/2015      Component Value Date/Time   NA 138 03/02/2015 0555   NA 142 02/09/2015 1021   K 3.1* 03/02/2015 0555   CL 106 03/02/2015 0555   CO2 24 03/02/2015 0555   GLUCOSE 110* 03/02/2015 0555   GLUCOSE 106* 02/09/2015 1021   BUN 5* 03/02/2015 0555   BUN 17 02/09/2015 1021   CREATININE 0.56 03/02/2015 0555   CALCIUM 7.9* 03/02/2015 0555   PROT 5.3* 03/02/2015 0555   PROT 6.4 02/09/2015 1021   ALBUMIN 2.8* 03/02/2015 0555   ALBUMIN 4.3 02/09/2015 1021   AST 42* 03/02/2015 0555   ALT 22 03/02/2015 0555   ALKPHOS 54 03/02/2015 0555   BILITOT 1.1 03/02/2015 0555   BILITOT 0.9 02/09/2015 1021   GFRNONAA >60 03/02/2015 0555   GFRAA >60 03/02/2015 0555    ASSESSMENT AND PLAN 80 y.o. year old female  has a past  medical history of Stroke ([place]) (2002); Hypertension; Diabetes mellitus (2011); Headache(784.0); Anxiety; Depression; Seizure ([place]); History of echocardiogram; Dementia; and UTI (urinary tract infection) (02/2015). here with:  1. Memory disturbance 2. Depression   3. Insomnia  The patient continues to have significant depression. We will increase the overall to 75 mg daily. She will let us know if this is not beneficial. She does have a psychiatrist the family states that the psychiatrist is out of the office for the next 3 months. The patient will be started on Namenda for her memory. I reviewed the side effects of this medication with the family and the patient. Patient and her family advised that if her symptoms worsen or she develops new symptoms she should let us know. She will follow-up in 4 months or sooner if needed.  Ward Givens, MSN, NP-C 09/21/2015, 8:58 AM [facility] [address], [place], [place] [address] [phone]

## 2015-09-30 ENCOUNTER — Ambulatory Visit: Payer: Self-pay | Admitting: Neurology

## 2015-10-13 DIAGNOSIS — N3020 Other chronic cystitis without hematuria: Secondary | ICD-10-CM | POA: Diagnosis not present

## 2015-10-16 ENCOUNTER — Other Ambulatory Visit: Payer: Self-pay | Admitting: Adult Health

## 2015-10-18 DIAGNOSIS — N289 Disorder of kidney and ureter, unspecified: Secondary | ICD-10-CM | POA: Diagnosis not present

## 2015-10-18 MED ORDER — MEMANTINE HCL 10 MG PO TABS: 10.0000 mg | ORAL_TABLET | Freq: Two times a day (BID) | ORAL | Status: DC

## 2015-10-18 NOTE — Telephone Encounter (Signed)
I sent in maintenance dose.

## 2015-10-18 NOTE — Telephone Encounter (Signed)
Patient's husband is calling to order the memantine as his wife's last dosage is tonight and she needs the 5th week.

## 2015-10-18 NOTE — Telephone Encounter (Signed)
Please place the order for the maintenance dose of namenda. Pt should not get a refill on the titration pack.

## 2015-10-21 DIAGNOSIS — L299 Pruritus, unspecified: Secondary | ICD-10-CM | POA: Diagnosis not present

## 2015-12-27 ENCOUNTER — Other Ambulatory Visit: Payer: Self-pay | Admitting: *Deleted

## 2015-12-27 MED ORDER — CARVEDILOL 6.25 MG PO TABS: 6.2500 mg | ORAL_TABLET | Freq: Two times a day (BID) | ORAL | Status: DC

## 2016-01-07 DIAGNOSIS — F332 Major depressive disorder, recurrent severe without psychotic features: Secondary | ICD-10-CM | POA: Diagnosis not present

## 2016-01-24 ENCOUNTER — Ambulatory Visit (INDEPENDENT_AMBULATORY_CARE_PROVIDER_SITE_OTHER): Payer: Medicare Other | Admitting: Adult Health

## 2016-01-24 ENCOUNTER — Encounter: Payer: Self-pay | Admitting: Adult Health

## 2016-01-24 VITALS — BP 120/60 | HR 76 | Ht 64.0 in | Wt 184.0 lb

## 2016-01-24 DIAGNOSIS — R413 Other amnesia: Secondary | ICD-10-CM

## 2016-01-24 DIAGNOSIS — G4700 Insomnia, unspecified: Secondary | ICD-10-CM | POA: Diagnosis not present

## 2016-01-24 NOTE — Progress Notes (Signed)
I have reviewed and agreed above plan. 

## 2016-01-24 NOTE — Progress Notes (Signed)
PATIENT: Martha Olsen DOB: Sep 12, 1935  REASON FOR VISIT: follow up- memory, insomnia HISTORY FROM: patient  HISTORY OF PRESENT ILLNESS: HISTORY (YAN): Martha Olsen is a 80 years old right-handed female accompanied by her husband and daughter, seen in refer by her primary care physician Dr. Donavan Burnet for evaluation of memory loss.  She has past medical history of HTN, s/p CVA right basal ganglia 11/2001, dyslipidemia, DM, anxiety/depression, s/p appendectomy, s/p hysterectomy, DJD, s/p left hip replacement 03/2002, left knee replacement 01/2011, foot surgery and reverse left shoulder arthroplasty 11/2011.  She had 9 years of education, she was a Chartered certified accountant, she worked at [employer], [employer], retried at age [place] care of her grandchildren. She denies family history of memory loss. Mother died at age 19 from stroke.  I saw her initially following her hospital discharge in March 2013,  In October 13 2011, she was at home, sitting at the rocking chair, was noted by her family fell over the chair, had generalized tonic-clonic seizure, patient has no warning signs, no collection of the event, EMS was called, she was taken by ambulance to the hospital, she could not remember waking up in the hospital confused, she was put on Keppra 500 mg twice a day, tolerating it well, there was no recurrent seizure. There was mild elevated troponin, she was seen by cardiologist, will continue followup  EEG was normal. MRI of the brain has demonstrated old basal ganglion, and coronal radiata stroke, no acute lesions,  She complains of headache everyday, right temporal regions, light noise is bothersome,   She has quit driving since 2595, husband retired, she is active at home, house work  UPDATE July 18th 2014:  She is overall doing well, no recurrent seizure, tolerating keppra '500mg'$  bid, she has mild gait difficulty due to left shoulder, left hip replacement.  UPDATE Dec 29th  2015:  She has no recurrent seizures, tolerating Keppra 500 mg twice a day without significant side effect, she has increased gait difficulty because of joints, knee pain, She has been complaining of a year history of worsening headaches, bilateral frontal, vortex region, she has to take ibuprofen 200 mg 2-3 tablets each time, sometimes two dosage each day  UPDATE Feb 2nd 2016: She was given prescription of tramadol 50 mg as needed since last visit December 2015, her headache has much improved, taking tramadol about twice each week, she continue has bilateral knee pain, low back pain, gait difficulty, she also has nocturia, frequent awakening, loss snoring, excessive daytime sleepiness, fatigue, sleep studies pending in August 25 2014  ESR, TSH was normal December 2015, mild elevated C-reactive protein 11.  UPDATE February 18 2015: She is accompanied by her husband, and daughter at today's clinical visit,  She had a urgent visit with Dr. Jaynee Eagles in February 09 2015 because of increased confusion, was found to have UTI, she was given prescription of Augmentin for 5 days, there was no significant change in her confusion  February 09 2015, EEG recording asymmetric slowing of the left hemisphere, with occasionally sharp transient  Patient's continue have slow worsening confusion, agitation, paranoid ideation, difficulty sleeping at nighttime, She had 10 years of education, worked two jobs most of her life, she retired at age 64, was very active until she had stroke in 2013, was noted to have declining functional status, increased memory trouble, especially since January 2016,  She also has worsening gait difficulty,worsening right knee pain, had a recent evaluation by orthopedic surgeon,she is  not a candidate for right knee replacement anymore,because of worsening memory trouble, gradually increased confusion  She also complains of low back pain,worsening bowel and bladder incontinence Update May 11 2015: Her confusion continues with getting worse, she is more agitated, could not well at night time, paranoid, scared, seroquel has been helpful, she is now taking 25 mg 1 in the morning, 1 at 2:00, 2 tablets at night  She is on polypharmacy treatment including Paxil, BuSpar, Ativan as needed, She has no recurrent seizure, taking Keppra 500 mg/1000 mg  UPDATE Aug 11 2015: She is now taking seroquel '25mg'$  3 tabs qhs,ativan prn, but she still has trouble sleeping. She is seeing psychiatrist, taking BuSpar, she has been dealing with depression for a long time. She was previously treated as SSRI, which has been helpful  She has no recurrent seizure, taking Keppra 500/1000 mg daily  UPDATE 09/21/15: Martha Olsen is a 80 year old female with a history of memory disorder and insomnia. She returns today for an evaluation. The patient did follow up with a psychiatrist. She was taken off of Seroquel. The patient states that she continues to have depression-- she has remained on Zoloft 50 mg daily. The patient has a hard time sleeping at night. However the Seroquel did not give her much benefit either. She was on Aricept for her memory but this caused itching and therefore the medication was discontinued by her psychiatrist. The patient is able to complete most ADLs independently. She lives at home with her husband. The patient is unable to get out of the house much because she does not drive and her husband is not able. She does feel that this may contribute to her depression. She denies any new neurological symptoms. She returns today for an evaluation.  TODAY 09/21/15 (MM):  Martha Olsen is a 80 year old female with a history of memory disorder and insomnia. She returns today for follow-up. She states that overall she is doing well. She feels that the increase in Zoloft has been beneficial. She is tolerating Namenda well. Feels that her memory has improved slightly. She is able to complete most ADLs independently  however she does need help with dressing. She does not operate a motor vehicle. Her husband now completes the finances and prepares all meals. She states that her psychiatrist recently started a new medication that has helped with her mood and her sleep. Overall she feels that she is doing much better. She returns today for an evaluation.  REVIEW OF SYSTEMS: Out of a complete 14 system review of symptoms, the patient complains only of the following symptoms, and all other reviewed systems are negative.  Shortness of breath, frequency of urination, headache, depression, environmental allergies  ALLERGIES: Allergies  Allergen Reactions  . Codeine Hives    HOME MEDICATIONS: Outpatient Prescriptions Prior to Visit  Medication Sig Dispense Refill  . aspirin 81 MG chewable tablet Chew 1 tablet (81 mg total) by mouth daily. 30 tablet 2  . atorvastatin (LIPITOR) 40 MG tablet Take 40 mg by mouth daily.    . busPIRone (BUSPAR) 15 MG tablet Take 15 mg by mouth 3 (three) times daily.    . carvedilol (COREG) 6.25 MG tablet Take 1 tablet (6.25 mg total) by mouth 2 (two) times daily with a meal. 60 tablet 11  . glimepiride (AMARYL) 2 MG tablet Take 2 mg by mouth daily before breakfast.    . levETIRAcetam (KEPPRA) 500 MG tablet 1 tablet every morning, 2 tablets every night 90 tablet  11  . lisinopril-hydrochlorothiazide (PRINZIDE,ZESTORETIC) 20-12.5 MG per tablet Take 1 tablet by mouth daily.    Marland Kitchen LORazepam (ATIVAN) 2 MG tablet Take 2 mg by mouth at bedtime.     . memantine (NAMENDA) 10 MG tablet Take 1 tablet (10 mg total) by mouth 2 (two) times daily. 60 tablet 11  . metFORMIN (GLUCOPHAGE) 1000 MG tablet Take 1,000 mg by mouth 2 (two) times daily with a meal.   1  . nitroGLYCERIN (NITROSTAT) 0.4 MG SL tablet Place 1 tablet (0.4 mg total) under the tongue every 5 (five) minutes as needed for chest pain. 25 tablet 3  . PROAIR HFA 108 (90 BASE) MCG/ACT inhaler Inhale 1-2 puffs into the lungs every 6 (six)  hours as needed for wheezing or shortness of breath.     . ranitidine (ZANTAC) 150 MG tablet Take 150 mg by mouth 2 (two) times daily as needed for heartburn.    . sertraline (ZOLOFT) 50 MG tablet Take 1.5 tablets (75 mg total) by mouth daily. 45 tablet 11  . trimethoprim (TRIMPEX) 100 MG tablet Take 100 mg by mouth daily.  1  . memantine (NAMENDA TITRATION PAK) tablet pack 5 mg/day for =1 week; 5 mg twice daily for =1 week; 15 mg/day given in 5 mg and 10 mg separated doses for =1 week; then 10 mg twice daily (Patient not taking: Reported on 01/24/2016) 49 tablet 0   No facility-administered medications prior to visit.    PAST MEDICAL HISTORY: Past Medical History  Diagnosis Date  . Stroke [facility]) 2002    right side weakness  . Hypertension   . Diabetes mellitus 2011  . Headache(784.0)   . Anxiety   . Depression   . Seizure ([place])   . History of echocardiogram     Echo 5/16: EF 55-60%, normal wall motion, grade 1 diastolic dysfunction  . Dementia   . UTI (urinary tract infection) 02/2015    PAST SURGICAL HISTORY: Past Surgical History  Procedure Laterality Date  . Foot surgery    . Cholecystectomy  1993  . Tonsillectomy  1951  . Eye surgery  2009  . Abdominal hysterectomy  1980's  . Appendectomy  1980's  . Joint replacement  2012    Hip, Knee  . Reverse shoulder arthroplasty  12/07/2011    Procedure: REVERSE SHOULDER ARTHROPLASTY;  Surgeon: Marin Shutter, MD;  Location: [facility];  Service: Orthopedics;  Laterality: Left;  left total reverse shoulder    FAMILY HISTORY: Family History  Problem Relation Age of Onset  . Anesthesia problems Neg Hx   . CAD Father 43  . Stroke Mother 22  . Heart attack Father   . Hypertension Mother     SOCIAL HISTORY: Social History   Social History  . Marital Status: Married    Spouse Name: Denyse Amass  . Number of Children: 4  . Years of Education: 9th   Occupational History  . Chartered certified accountant     Retired   Social History Main Topics  .  Smoking status: Never Smoker   . Smokeless tobacco: Never Used  . Alcohol Use: No  . Drug Use: No  . Sexual Activity: Not on file   Other Topics Concern  . Not on file   Social History Narrative   Son recently died of cancer.  Lives at home with husband Thomes Cake) .  She has 3 children .    Education 10 th grade.   Right handed.   Caffeine use: 1 cup  coffee/ day               PHYSICAL EXAM  Filed Vitals:   01/24/16 1521  BP: 120/60  Pulse: 76  Height: '5\' 4"'$  (1.626 m)  Weight: 184 lb (83.462 kg)   Body mass index is 31.57 kg/(m^2).   MMSE - Mini Mental State Exam 01/24/2016 09/21/2015 08/11/2015  Orientation to time '5 3 5  '$ Orientation to Place '5 4 5  '$ Registration '3 3 3  '$ Attention/ Calculation 1 0 5  Recall '2 3 3  '$ Language- name 2 objects '2 2 2  '$ Language- repeat '1 1 1  '$ Language- follow 3 step command '3 3 3  '$ Language- read & follow direction '1 1 1  '$ Write a sentence '1 1 1  '$ Copy design 1 0 0  Total score '25 21 29     '$ Generalized: Well developed, in no acute distress   Neurological examination  Mentation: Alert oriented to time, place, history taking. Follows all commands speech and language fluent Cranial nerve II-XII: Pupils were equal round reactive to light. Extraocular movements were full, visual field were full on confrontational test. Facial sensation and strength were normal. Uvula tongue midline. Head turning and shoulder shrug  were normal and symmetric. Motor: The motor testing reveals 5 over 5 strength of all 4 extremities. Good symmetric motor tone is noted throughout.  Sensory: Sensory testing is intact to soft touch on all 4 extremities. No evidence of extinction is noted.  Coordination: Cerebellar testing reveals good finger-nose-finger and heel-to-shin bilaterally.  Gait and station: Gait is normal. Tandem gait is normal. Romberg is negative. No drift is seen.  Reflexes: Deep tendon reflexes are symmetric and normal bilaterally.   DIAGNOSTIC DATA (LABS,  IMAGING, TESTING) - I reviewed patient records, labs, notes, testing and imaging myself where available.  Lab Results  Component Value Date   WBC 4.0 03/02/2015   HGB 9.4* 03/02/2015   HCT 29.7* 03/02/2015   MCV 90.8 03/02/2015   PLT 146* 03/02/2015      Component Value Date/Time   NA 138 03/02/2015 0555   NA 142 02/09/2015 1021   K 3.1* 03/02/2015 0555   CL 106 03/02/2015 0555   CO2 24 03/02/2015 0555   GLUCOSE 110* 03/02/2015 0555   GLUCOSE 106* 02/09/2015 1021   BUN 5* 03/02/2015 0555   BUN 17 02/09/2015 1021   CREATININE 0.56 03/02/2015 0555   CALCIUM 7.9* 03/02/2015 0555   PROT 5.3* 03/02/2015 0555   PROT 6.4 02/09/2015 1021   ALBUMIN 2.8* 03/02/2015 0555   ALBUMIN 4.3 02/09/2015 1021   AST 42* 03/02/2015 0555   ALT 22 03/02/2015 0555   ALKPHOS 54 03/02/2015 0555   BILITOT 1.1 03/02/2015 0555   BILITOT 0.9 02/09/2015 1021   GFRNONAA >60 03/02/2015 0555   GFRAA >60 03/02/2015 0555          ASSESSMENT AND PLAN 80 y.o. year old female  has a past medical history of Stroke ([place]) (2002); Hypertension; Diabetes mellitus (2011); Headache(784.0); Anxiety; Depression; Seizure ([place]); History of echocardiogram; Dementia; and UTI (urinary tract infection) (02/2015). here with:  1. Memory disturbance 2. Insomnia  The patient's memory score has actually improved. She will continue on Namenda. Continue on Zoloft 75 mg daily. Patient advised that if her symptoms worsen or she develops any new symptoms she should let us know.Will follow-up in 6 months with Dr. [name].   Ward Givens, MSN, NP-C 01/24/2016, 4:02 PM [facility] [address] [place],  [place] [address] [phone]

## 2016-01-24 NOTE — Patient Instructions (Signed)
Continue Namenda Memory score is stable If your symptoms worsen or you develop new symptoms please let us know.   

## 2016-01-25 DIAGNOSIS — H25092 Other age-related incipient cataract, left eye: Secondary | ICD-10-CM | POA: Diagnosis not present

## 2016-01-25 DIAGNOSIS — E119 Type 2 diabetes mellitus without complications: Secondary | ICD-10-CM | POA: Diagnosis not present

## 2016-01-25 DIAGNOSIS — H26492 Other secondary cataract, left eye: Secondary | ICD-10-CM | POA: Diagnosis not present

## 2016-02-14 ENCOUNTER — Ambulatory Visit: Payer: Medicare Other | Admitting: Adult Health

## 2016-02-14 DIAGNOSIS — N3946 Mixed incontinence: Secondary | ICD-10-CM | POA: Diagnosis not present

## 2016-02-14 DIAGNOSIS — N3020 Other chronic cystitis without hematuria: Secondary | ICD-10-CM | POA: Diagnosis not present

## 2016-03-14 DIAGNOSIS — F411 Generalized anxiety disorder: Secondary | ICD-10-CM | POA: Diagnosis not present

## 2016-03-14 DIAGNOSIS — E7800 Pure hypercholesterolemia, unspecified: Secondary | ICD-10-CM | POA: Diagnosis not present

## 2016-03-14 DIAGNOSIS — E1142 Type 2 diabetes mellitus with diabetic polyneuropathy: Secondary | ICD-10-CM | POA: Diagnosis not present

## 2016-03-14 DIAGNOSIS — I10 Essential (primary) hypertension: Secondary | ICD-10-CM | POA: Diagnosis not present

## 2016-03-14 DIAGNOSIS — K219 Gastro-esophageal reflux disease without esophagitis: Secondary | ICD-10-CM | POA: Diagnosis not present

## 2016-03-14 DIAGNOSIS — Z7984 Long term (current) use of oral hypoglycemic drugs: Secondary | ICD-10-CM | POA: Diagnosis not present

## 2016-03-14 DIAGNOSIS — M150 Primary generalized (osteo)arthritis: Secondary | ICD-10-CM | POA: Diagnosis not present

## 2016-03-23 ENCOUNTER — Telehealth: Payer: Self-pay | Admitting: *Deleted

## 2016-03-23 NOTE — Telephone Encounter (Signed)
Lab results received from [facility] (collected 03/14/16):  Creatinine: 1.39  Lipid Panel: Cholesterol: 166 Triglyceride: 227 LCLc: 66  Hemoglobin A1C: EAG: 103 HGB A1C: 5.2

## 2016-04-11 DIAGNOSIS — F332 Major depressive disorder, recurrent severe without psychotic features: Secondary | ICD-10-CM | POA: Diagnosis not present

## 2016-04-12 DIAGNOSIS — F332 Major depressive disorder, recurrent severe without psychotic features: Secondary | ICD-10-CM | POA: Diagnosis not present

## 2016-04-21 DIAGNOSIS — Z23 Encounter for immunization: Secondary | ICD-10-CM | POA: Diagnosis not present

## 2016-05-02 DIAGNOSIS — F332 Major depressive disorder, recurrent severe without psychotic features: Secondary | ICD-10-CM | POA: Diagnosis not present

## 2016-05-08 ENCOUNTER — Encounter: Payer: Self-pay | Admitting: Neurology

## 2016-06-19 DIAGNOSIS — F332 Major depressive disorder, recurrent severe without psychotic features: Secondary | ICD-10-CM | POA: Diagnosis not present

## 2016-07-26 ENCOUNTER — Encounter: Payer: Self-pay | Admitting: Neurology

## 2016-07-26 ENCOUNTER — Ambulatory Visit (INDEPENDENT_AMBULATORY_CARE_PROVIDER_SITE_OTHER): Payer: Medicare Other | Admitting: Neurology

## 2016-07-26 VITALS — BP 148/61 | HR 84 | Ht 64.0 in | Wt 195.2 lb

## 2016-07-26 DIAGNOSIS — F0391 Unspecified dementia with behavioral disturbance: Secondary | ICD-10-CM

## 2016-07-26 DIAGNOSIS — I6300 Cerebral infarction due to thrombosis of unspecified precerebral artery: Secondary | ICD-10-CM

## 2016-07-26 DIAGNOSIS — G40909 Epilepsy, unspecified, not intractable, without status epilepticus: Secondary | ICD-10-CM | POA: Diagnosis not present

## 2016-07-26 MED ORDER — LEVETIRACETAM 500 MG PO TABS: ORAL_TABLET | ORAL | 4 refills | Status: DC

## 2016-07-26 NOTE — Progress Notes (Signed)
PATIENT: NELMA PHAGAN DOB: [birth year]  REASON FOR VISIT: follow up- memory, insomnia HISTORY FROM: patient  HISTORY OF PRESENT ILLNESS: HISTORY ([place] Grygiel): [name], seen in refer by her primary care physician Dr. Donavan Burnet for evaluation of memory loss.  She has past medical history of HTN, s/p CVA right basal ganglia 11/2001, dyslipidemia, DM, anxiety/depression, s/p appendectomy, s/p hysterectomy, DJD, s/p left hip replacement 03/2002, left knee replacement 01/2011, foot surgery and reverse left shoulder arthroplasty 11/2011.  She had 9 years of education, she was a Chartered certified accountant, she worked at [employer], [employer], retried at age [employer] care of her grandchildren. She denies family history of memory loss. Mother died at age 94 from stroke.  I saw her initially following her hospital discharge in March 2013,  In October 13 2011, she was at home, sitting at the rocking chair, was noted by her family fell over the chair, had generalized tonic-clonic seizure, patient has no warning signs, no collection of the event, EMS was called, she was taken by ambulance to the hospital, she could not remember waking up in the hospital confused, she was put on Keppra 500 mg twice a day, tolerating it well, there was no recurrent seizure.  EEG was normal. MRI of the brain in 2013 has demonstrated old basal ganglion, and coronal radiata stroke, no acute lesions,  She complains of headache everyday, right temporal regions, light noise is bothersome,   She has quit driving since 9563, husband retired, she is active at home, house work  She has no recurrent seizures, tolerating Keppra 500 mg twice a day without significant side effect, she has increased gait difficulty because of joints, knee pain, She has been complaining of a year history of worsening headaches, bilateral frontal, vortex region, she has to take ibuprofen 200 mg 2-3 tablets each time, sometimes two dosage each day  She was  given prescription of tramadol 50 mg as needed in December 2015, her headache has much improved, taking tramadol about twice each week, she continue has bilateral knee pain, low back pain, gait difficulty, she also has nocturia, frequent awakening, loss snoring, excessive daytime sleepiness, fatigue, sleep studies pending in August 25 2014  ESR, TSH was normal December 2015, mild elevated C-reactive protein 11.  UPDATE February 18 2015: She is accompanied by her husband, and daughter at today's clinical visit,  She had a urgent visit with Dr. Jaynee Eagles in February 09 2015 because of increased confusion, was found to have UTI, she was given prescription of Augmentin for 5 days, there was no significant change in her confusion  February 09 2015, EEG recording asymmetric slowing of the left hemisphere, with occasionally sharp transient  Patient's continue have slow worsening confusion, agitation, paranoid ideation, difficulty sleeping at nighttime, She had 10 years of education, worked two jobs most of her life, she retired at age 63, was very active until she had stroke in 2013, was noted to have declining functional status, increased memory trouble, especially since January 2016,  She also has worsening gait difficulty,worsening right knee pain, had a recent evaluation by orthopedic surgeon,she is not a candidate for right knee replacement anymore,because of worsening memory trouble, gradually increased confusion  She also complains of low back pain,worsening bowel and bladder incontinence Update May 11 2015: Her confusion continues with getting worse, she is more agitated, could not well at night time, paranoid, scared, seroquel has been helpful, she is now taking 25 mg 1 in the morning, 1 at  2:00, 2 tablets at night  She is on polypharmacy treatment including Paxil, BuSpar, Ativan as needed, She has no recurrent seizure, taking Keppra 500 mg/1000 mg  UPDATE Aug 11 2015: She is now taking seroquel '25mg'$   3 tabs qhs,ativan prn, but she still has trouble sleeping. She is seeing psychiatrist, taking BuSpar, she has been dealing with depression for a long time. She was previously treated as SSRI, which has been helpful  She has no recurrent seizure, taking Keppra 500/1000 mg daily  UPDATE 09/21/15: Ms. Gasior is a 81 year old female with a history of memory disorder and insomnia. She returns today for an evaluation. The patient did follow up with a psychiatrist. She was taken off of Seroquel. The patient states that she continues to have depression-- she has remained on Zoloft 50 mg daily. The patient has a hard time sleeping at night. However the Seroquel did not give her much benefit either. She was on Aricept for her memory but this caused itching and therefore the medication was discontinued by her psychiatrist. The patient is able to complete most ADLs independently. She lives at home with her husband. The patient is unable to get out of the house much because she does not drive and her husband is not able. She does feel that this may contribute to her depression. She denies any new neurological symptoms. She returns today for an evaluation.  UPDATE Jan 10th 2018: She is with her husband at today's clinical visit, she has been doing very well over the past 6 months, tolerating current medications, does not want to make any changes, no recurrent seizure, on Keppra 500/1000,  Reviewed laboratory evaluation from [facility] in September 2017 Cholesterol: 166 Triglyceride: 227 LCLc: 66  Hemoglobin A1C: EAG: 103 HGB A1C: 5.2   REVIEW OF SYSTEMS: Out of a complete 14 system review of symptoms, the patient complains only of the following symptoms, and all other reviewed systems are negative. Gait abnormality, depression, headache, joint pain, back pain  ALLERGIES: Allergies  Allergen Reactions  . Codeine Hives    HOME MEDICATIONS: Outpatient Medications Prior to Visit  Medication Sig Dispense  Refill  . aspirin 81 MG chewable tablet Chew 1 tablet (81 mg total) by mouth daily. 30 tablet 2  . atorvastatin (LIPITOR) 40 MG tablet Take 40 mg by mouth daily.    . busPIRone (BUSPAR) 15 MG tablet Take 15 mg by mouth 3 (three) times daily.    . carvedilol (COREG) 6.25 MG tablet Take 1 tablet (6.25 mg total) by mouth 2 (two) times daily with a meal. 60 tablet 11  . glimepiride (AMARYL) 2 MG tablet Take 2 mg by mouth daily before breakfast.    . levETIRAcetam (KEPPRA) 500 MG tablet 1 tablet every morning, 2 tablets every night 90 tablet 11  . lisinopril-hydrochlorothiazide (PRINZIDE,ZESTORETIC) 20-12.5 MG per tablet Take 1 tablet by mouth daily.    Marland Kitchen LORazepam (ATIVAN) 2 MG tablet Take 2 mg by mouth at bedtime.     . memantine (NAMENDA) 10 MG tablet Take 1 tablet (10 mg total) by mouth 2 (two) times daily. 60 tablet 11  . metFORMIN (GLUCOPHAGE) 1000 MG tablet Take 1,000 mg by mouth 2 (two) times daily with a meal.   1  . nitroGLYCERIN (NITROSTAT) 0.4 MG SL tablet Place 1 tablet (0.4 mg total) under the tongue every 5 (five) minutes as needed for chest pain. 25 tablet 3  . PROAIR HFA 108 (90 BASE) MCG/ACT inhaler Inhale 1-2 puffs into the lungs every  6 (six) hours as needed for wheezing or shortness of breath.     . ranitidine (ZANTAC) 150 MG tablet Take 150 mg by mouth 2 (two) times daily as needed for heartburn.    . sertraline (ZOLOFT) 50 MG tablet Take 1.5 tablets (75 mg total) by mouth daily. 45 tablet 11  . trimethoprim (TRIMPEX) 100 MG tablet Take 100 mg by mouth daily.  1   No facility-administered medications prior to visit.     PAST MEDICAL HISTORY: Past Medical History:  Diagnosis Date  . Anxiety   . Dementia   . Depression   . Diabetes mellitus 2011  . Headache(784.0)   . History of echocardiogram    Echo 5/16: EF 55-60%, normal wall motion, grade 1 diastolic dysfunction  . Hypertension   . Seizure ([place])   . Stroke [facility]) 2002   right side weakness  . UTI (urinary tract  infection) 02/2015    PAST SURGICAL HISTORY: Past Surgical History:  Procedure Laterality Date  . ABDOMINAL HYSTERECTOMY  1980's  . APPENDECTOMY  1980's  . CHOLECYSTECTOMY  1993  . EYE SURGERY  2009  . FOOT SURGERY    . JOINT REPLACEMENT  2012   Hip, Knee  . REVERSE SHOULDER ARTHROPLASTY  12/07/2011   Procedure: REVERSE SHOULDER ARTHROPLASTY;  Surgeon: Marin Shutter, MD;  Location: [facility];  Service: Orthopedics;  Laterality: Left;  left total reverse shoulder  . TONSILLECTOMY  1951    FAMILY HISTORY: Family History  Problem Relation Age of Onset  . Anesthesia problems Neg Hx   . CAD Father 56  . Stroke Mother 57  . Heart attack Father   . Hypertension Mother     SOCIAL HISTORY: Social History   Social History  . Marital status: Married    Spouse name: Denyse Amass  . Number of children: 4  . Years of education: 9th   Occupational History  . Chartered certified accountant     Retired   Social History Main Topics  . Smoking status: Never Smoker  . Smokeless tobacco: Never Used  . Alcohol use No  . Drug use: No  . Sexual activity: Not on file   Other Topics Concern  . Not on file   Social History Narrative   Son recently died of cancer.  Lives at home with husband Thomes Cake) .  She has 3 children .    Education 10 th grade.   Right handed.   Caffeine use: 1 cup coffee/ day               PHYSICAL EXAM  Vitals:   07/26/16 1055  BP: (!) 148/61  Pulse: 84  Weight: 195 lb 4 oz (88.6 kg)  Height: '5\' 4"'$  (1.626 m)   Body mass index is 33.51 kg/m.   MMSE - Mini Mental State Exam 07/26/2016 01/24/2016 09/21/2015  Orientation to time '5 5 3  '$ Orientation to Place '5 5 4  '$ Registration '3 3 3  '$ Attention/ Calculation 5 1 0  Recall '2 2 3  '$ Language- name 2 objects '2 2 2  '$ Language- repeat '1 1 1  '$ Language- follow 3 step command '3 3 3  '$ Language- read & follow direction '1 1 1  '$ Write a sentence '1 1 1  '$ Copy design 0 1 0  Total score '28 25 21   '$ PHYSICAL EXAMNIATION:  Gen: NAD, conversant,  well nourised, obese, well groomed  Cardiovascular: Regular rate rhythm, no peripheral edema, warm, nontender. Eyes: Conjunctivae clear without exudates or hemorrhage Neck: Supple, no carotid bruits. Pulmonary: Clear to auscultation bilaterally   NEUROLOGICAL EXAM:  MENTAL STATUS: Speech:    Speech is normal; fluent and spontaneous with normal comprehension.  Cognition: Mental status examination 28/30, animal naming 6     Orientation to time, place and person     Recent and remote memory: He missed one out of 3 recalls     Normal Attention span and concentration     Normal Language, naming, repeating,spontaneous speech, mild difficulty with copy     Fund of knowledge   CRANIAL NERVES: CN II: Visual fields are full to confrontation. Fundoscopic exam is normal with sharp discs and no vascular changes. Pupils are round equal and briskly reactive to light. CN III, IV, VI: extraocular movement are normal. No ptosis. CN V: Facial sensation is intact to pinprick in all 3 divisions bilaterally. Corneal responses are intact.  CN VII: Face is symmetric with normal eye closure and smile. CN VIII: Hearing is normal to rubbing fingers CN IX, X: Palate elevates symmetrically. Phonation is normal. CN XI: Head turning and shoulder shrug are intact CN XII: Tongue is midline with normal movements and no atrophy.  MOTOR: Limited range of motion of left shoulder, no significant weakness notice.  REFLEXES: Reflexes are 2+ and symmetric at the biceps, triceps, knees, and ankles. Plantar responses are flexor.  SENSORY: Intact to light touch, pinprick, positional and vibratory sensation are intact in fingers and toes.  COORDINATION: Rapid alternating movements and fine finger movements are intact. There is no dysmetria on finger-to-nose and heel-knee-shin.    GAIT/STANCE: She needs to hold on chair arm to get up from seated position, wide based, mildly unsteady   DIAGNOSTIC  DATA (LABS, IMAGING, TESTING) - I reviewed patient records, labs, notes, testing and imaging myself where available.  Lab Results  Component Value Date   WBC 4.0 03/02/2015   HGB 9.4 (L) 03/02/2015   HCT 29.7 (L) 03/02/2015   MCV 90.8 03/02/2015   PLT 146 (L) 03/02/2015      Component Value Date/Time   NA 138 03/02/2015 0555   NA 142 02/09/2015 1021   K 3.1 (L) 03/02/2015 0555   CL 106 03/02/2015 0555   CO2 24 03/02/2015 0555   GLUCOSE 110 (H) 03/02/2015 0555   BUN 5 (L) 03/02/2015 0555   BUN 17 02/09/2015 1021   CREATININE 0.56 03/02/2015 0555   CALCIUM 7.9 (L) 03/02/2015 0555   PROT 5.3 (L) 03/02/2015 0555   PROT 6.4 02/09/2015 1021   ALBUMIN 2.8 (L) 03/02/2015 0555   ALBUMIN 4.3 02/09/2015 1021   AST 42 (H) 03/02/2015 0555   ALT 22 03/02/2015 0555   ALKPHOS 54 03/02/2015 0555   BILITOT 1.1 03/02/2015 0555   BILITOT 0.9 02/09/2015 1021   GFRNONAA >60 03/02/2015 0555   GFRAA >60 03/02/2015 0555    ASSESSMENT AND PLAN 81 y.o. year old female   Dementia  Mini-Mental Status Examination 28/30 Seizure,  No recurrent seizures since tolerating Keppra 500 mg/1000 mg,  Marcial Pacas, M.D. Ph.D.  [facility] [place], [place] [address] Phone: [phone] Fax:      [phone]

## 2016-09-22 DIAGNOSIS — E7800 Pure hypercholesterolemia, unspecified: Secondary | ICD-10-CM | POA: Diagnosis not present

## 2016-09-22 DIAGNOSIS — E1142 Type 2 diabetes mellitus with diabetic polyneuropathy: Secondary | ICD-10-CM | POA: Diagnosis not present

## 2016-09-22 DIAGNOSIS — M150 Primary generalized (osteo)arthritis: Secondary | ICD-10-CM | POA: Diagnosis not present

## 2016-09-22 DIAGNOSIS — I10 Essential (primary) hypertension: Secondary | ICD-10-CM | POA: Diagnosis not present

## 2016-09-29 ENCOUNTER — Telehealth: Payer: Self-pay | Admitting: Neurology

## 2016-09-29 NOTE — Telephone Encounter (Signed)
Pt daughter Lelon Frohlich called to inform that her mothers husband has died and she wanted to speak with nurse re: anything she should be on the look out for such as changes in her mother as a result of the passing of her husband, she is asking to be called at [phone]

## 2016-10-02 NOTE — Telephone Encounter (Signed)
Her daughters are concerned that her mother's anxiety and depression my become worse due to her husband passing away.  They are making an appt to see her PCP to make sure this does not happen.  They will call here with any added concerns but wanted this big life change documented in her chart.

## 2016-10-12 ENCOUNTER — Telehealth: Payer: Self-pay | Admitting: Neurology

## 2016-10-12 ENCOUNTER — Other Ambulatory Visit: Payer: Self-pay | Admitting: *Deleted

## 2016-10-12 MED ORDER — CARVEDILOL 6.25 MG PO TABS: 6.2500 mg | ORAL_TABLET | Freq: Two times a day (BID) | ORAL | 3 refills | Status: DC

## 2016-10-12 MED ORDER — LEVETIRACETAM 500 MG PO TABS: ORAL_TABLET | ORAL | 3 refills | Status: DC

## 2016-10-12 MED ORDER — MEMANTINE HCL 10 MG PO TABS: 10.0000 mg | ORAL_TABLET | Freq: Two times a day (BID) | ORAL | 3 refills | Status: DC

## 2016-10-12 MED ORDER — SERTRALINE HCL 50 MG PO TABS
75.0000 mg | ORAL_TABLET | Freq: Every day | ORAL | 3 refills | Status: DC
Start: 2016-10-12 — End: 2017-09-12

## 2016-10-12 NOTE — Telephone Encounter (Signed)
Pt asked me to speak w/her daughter Marsa Aris re: her medication. Pt daughter is calling to provide the fax# for the mail order company for all medications prescribed by Dr [name]. The fax# is [phone] [facility] through [employer]

## 2016-10-12 NOTE — Telephone Encounter (Signed)
Dr. Rhea Belton prescriptions have been sent to [facility] mail order as requested.

## 2016-12-13 DIAGNOSIS — F332 Major depressive disorder, recurrent severe without psychotic features: Secondary | ICD-10-CM | POA: Diagnosis not present

## 2017-02-16 DIAGNOSIS — E119 Type 2 diabetes mellitus without complications: Secondary | ICD-10-CM | POA: Diagnosis not present

## 2017-03-15 DIAGNOSIS — F332 Major depressive disorder, recurrent severe without psychotic features: Secondary | ICD-10-CM | POA: Diagnosis not present

## 2017-03-26 DIAGNOSIS — I10 Essential (primary) hypertension: Secondary | ICD-10-CM | POA: Diagnosis not present

## 2017-03-26 DIAGNOSIS — E1142 Type 2 diabetes mellitus with diabetic polyneuropathy: Secondary | ICD-10-CM | POA: Diagnosis not present

## 2017-03-26 DIAGNOSIS — E7800 Pure hypercholesterolemia, unspecified: Secondary | ICD-10-CM | POA: Diagnosis not present

## 2017-03-26 DIAGNOSIS — M150 Primary generalized (osteo)arthritis: Secondary | ICD-10-CM | POA: Diagnosis not present

## 2017-04-02 DIAGNOSIS — E1142 Type 2 diabetes mellitus with diabetic polyneuropathy: Secondary | ICD-10-CM | POA: Diagnosis not present

## 2017-05-17 DIAGNOSIS — E1142 Type 2 diabetes mellitus with diabetic polyneuropathy: Secondary | ICD-10-CM | POA: Diagnosis not present

## 2017-06-11 DIAGNOSIS — F332 Major depressive disorder, recurrent severe without psychotic features: Secondary | ICD-10-CM | POA: Diagnosis not present

## 2017-07-12 DIAGNOSIS — E1142 Type 2 diabetes mellitus with diabetic polyneuropathy: Secondary | ICD-10-CM | POA: Diagnosis not present

## 2017-07-26 ENCOUNTER — Ambulatory Visit: Payer: Medicare Other | Admitting: Adult Health

## 2017-07-26 ENCOUNTER — Encounter: Payer: Self-pay | Admitting: Adult Health

## 2017-07-26 VITALS — BP 173/73 | HR 68 | Ht 64.0 in | Wt 194.2 lb

## 2017-07-26 DIAGNOSIS — R413 Other amnesia: Secondary | ICD-10-CM | POA: Diagnosis not present

## 2017-07-26 DIAGNOSIS — R569 Unspecified convulsions: Secondary | ICD-10-CM

## 2017-07-26 NOTE — Progress Notes (Signed)
PATIENT: Martha Olsen DOB: [birth year]  REASON FOR VISIT: follow up HISTORY FROM: patient  HISTORY OF PRESENT ILLNESS:  HISTORY (YAN): Martha Olsen, seen in refer by Martha Olsen primary care physician Dr. Donavan Burnet for evaluation of memory loss.  Martha Olsen has past medical history of HTN, s/p CVA right basal ganglia 11/2001, dyslipidemia, DM, anxiety/depression, s/p appendectomy, s/p hysterectomy, DJD, s/p left hip replacement 03/2002, left knee replacement 01/2011, foot surgery and reverse left shoulder arthroplasty 11/2011.  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 Martha Olsen denies family history of memory loss. Mother died at age 27 from stroke.  I saw Martha Olsen initially following Martha Olsen hospital discharge in March 2013,  In October 13 2011, Martha Olsen was at home, sitting at the rocking chair, was noted by Martha Olsen family fell over the chair, had generalized tonic-clonic seizure, patient has no warning signs, no collection of the event, EMS was called, Martha Olsen was taken by ambulance to the hospital, Martha Olsen could not remember waking up in the hospital confused, Martha Olsen was put on Keppra 500 mg twice a day, tolerating it well, there was no recurrent seizure.  EEG was normal. MRI of the brain in 2013 has demonstrated old basal ganglion, and coronal radiata stroke, no acute lesions,  Martha Olsen complains of headache everyday, right temporal regions, light noise is bothersome,   Martha Olsen has quit driving since 3499, husband retired, Martha Olsen is active at home, house work  Martha Olsen has no recurrent seizures, tolerating Keppra 500 mg twice a day without significant side effect, Martha Olsen has increased gait difficulty because of joints, knee pain, Martha Olsen has been complaining of a year history of worsening headaches, bilateral frontal, vortex region, Martha Olsen has to take ibuprofen 200 mg 2-3 tablets each time, sometimes two dosage each day  Martha Olsen was given  prescription of tramadol 50 mg as needed in December 2015, Martha Olsen headache has much improved, taking tramadol about twice each week, Martha Olsen continue has bilateral knee pain, low back pain, gait difficulty, Martha Olsen also has nocturia, frequent awakening, loss snoring, excessive daytime sleepiness, fatigue, sleep studies pending in August 25 2014  ESR, TSH was normal December 2015, mild elevated C-reactive protein 11.  UPDATE February 18 2015: Martha Olsen is accompanied by Martha Olsen husband, and daughter at today's clinical visit,  Martha Olsen had a urgent visit with Dr. Jaynee Eagles in February 09 2015 because of increased confusion, was found to have UTI, Martha Olsen was given prescription of Augmentin for 5 days, there was no significant change in Martha Olsen confusion  February 09 2015, EEG recording asymmetric slowing of the left hemisphere, with occasionally sharp transient  Patient's continue have slow worsening confusion, agitation, paranoid ideation, difficulty sleeping at nighttime, Martha Olsen had 10 years of education, worked two jobs most of Martha Olsen life, Martha Olsen retired at age 82, was very active until Martha Olsen had stroke in 2013, was noted to have declining functional status, increased memory trouble, especially since January 2016,  Martha Olsen also has worsening gait difficulty,worsening right knee pain, had a recent evaluation by orthopedic surgeon,Martha Olsen is not a candidate for right knee replacement anymore,because of worsening memory trouble, gradually increased confusion  Martha Olsen also complains of low back pain,worsening bowel and bladder incontinence Update May 11 2015: Martha Olsen confusion continues with getting worse, Martha Olsen is more agitated, could not well at night time, paranoid, scared, seroquel has been helpful, Martha Olsen is now taking 25 mg 1 in the morning, 1 at 2:00,  2 tablets at night  Martha Olsen is on polypharmacy treatment including Paxil, BuSpar, Ativan as needed, Martha Olsen has no recurrent seizure, taking Keppra 500 mg/1000 mg  UPDATE Aug 11 2015: Martha Olsen is now taking seroquel  55m 3 tabs qhs,ativan prn, but Martha Olsen still has trouble sleeping. Martha Olsen is seeing psychiatrist, taking BuSpar, Martha Olsen has been dealing with depression for a long time. Martha Olsen was previously treated as SSRI, which has been helpful  Martha Olsen has no recurrent seizure, taking Keppra 500/1000 mg daily  UPDATE 09/21/15: Martha Olsen a 82year old female with a history of memory disorder and insomnia. Martha Olsen returns today for an evaluation. The patient did follow up with a psychiatrist. Martha Olsen was taken off of Seroquel. The patient states that Martha Olsen continues to have depression-- Martha Olsen has remained on Zoloft 50 mg daily. The patient has a hard time sleeping at night. However the Seroquel did not give Martha Olsen much benefit either. Martha Olsen was on Aricept for Martha Olsen memory but this caused itching and therefore the medication was discontinued by Martha Olsen psychiatrist. The patient is able to complete most ADLs independently. Martha Olsen lives at home with Martha Olsen husband. The patient is unable to get out of the house much because Martha Olsen does not drive and Martha Olsen husband is not able. Martha Olsen does feel that this may contribute to Martha Olsen depression. Martha Olsen denies any new neurological symptoms. Martha Olsen returns today for an evaluation.  UPDATE Jan 10th 2018: Martha Olsen is with Martha Olsen husband at today's clinical visit, Martha Olsen has been doing very well over the past 6 months, tolerating current medications, does not want to make any changes, no recurrent seizure, on Keppra 500/1000  Today 07/26/17 Martha Olsen a 82year old female with a history of memory disturbance and seizures.  Martha Olsen returns today for follow-up.  Martha Olsen continues on Namenda for Martha Olsen memory.  Martha Olsen is now living with Martha Olsen daughter and.  Martha Olsen husband passed away last MMar 20, 2024  Martha Olsen is able to complete all ADLs independently.  Martha Olsen does need assistance getting in and out of the shower.  Martha Olsen daughter does all of Martha Olsen finances.  Martha Olsen reports that Martha Olsen has not had any additional seizure events.  Martha Olsen remains on Keppra.  Overall Martha Olsen feels that Martha Olsen is doing  well.  Martha Olsen returns today for evaluation.   REVIEW OF SYSTEMS: Out of a complete 14 system review of symptoms, the patient complains only of the following symptoms, and all other reviewed systems are negative.  See HPI  ALLERGIES: Allergies  Allergen Reactions  . Codeine Hives    HOME MEDICATIONS: Outpatient Medications Prior to Visit  Medication Sig Dispense Refill  . aspirin 81 MG chewable tablet Chew 1 tablet (81 mg total) by mouth daily. 30 tablet 2  . atorvastatin (LIPITOR) 40 MG tablet Take 40 mg by mouth daily.    . busPIRone (BUSPAR) 15 MG tablet Take 15 mg by mouth 3 (three) times daily.    . carvedilol (COREG) 6.25 MG tablet Take 1 tablet (6.25 mg total) by mouth 2 (two) times daily with a meal. 180 tablet 3  . glimepiride (AMARYL) 2 MG tablet Take 2 mg by mouth daily before breakfast.    . levETIRAcetam (KEPPRA) 500 MG tablet 1 tablet every morning, 2 tablets every night 270 tablet 3  . lisinopril-hydrochlorothiazide (PRINZIDE,ZESTORETIC) 20-12.5 MG per tablet Take 1 tablet by mouth daily.    .Marland KitchenLORazepam (ATIVAN) 2 MG tablet Take 2 mg by mouth at bedtime.     . memantine (NAMENDA) 10 MG tablet Take 1 tablet (  10 mg total) by mouth 2 (two) times daily. 180 tablet 3  . metFORMIN (GLUCOPHAGE) 1000 MG tablet Take 1,000 mg by mouth 2 (two) times daily with a meal.   1  . nitroGLYCERIN (NITROSTAT) 0.4 MG SL tablet Place 1 tablet (0.4 mg total) under the tongue every 5 (five) minutes as needed for chest pain. 25 tablet 3  . PROAIR HFA 108 (90 BASE) MCG/ACT inhaler Inhale 1-2 puffs into the lungs every 6 (six) hours as needed for wheezing or shortness of breath.     . ranitidine (ZANTAC) 150 MG tablet Take 150 mg by mouth 2 (two) times daily as needed for heartburn.    . sertraline (ZOLOFT) 50 MG tablet Take 1.5 tablets (75 mg total) by mouth daily. 135 tablet 3   No facility-administered medications prior to visit.     PAST MEDICAL HISTORY: Past Medical History:  Diagnosis Date   . Anxiety   . Dementia   . Depression   . Diabetes mellitus 2011  . Headache(784.0)   . History of echocardiogram    Echo 5/16: EF 55-60%, normal wall motion, grade 1 diastolic dysfunction  . Hypertension   . Seizure ([place])   . Stroke [facility]) 2002   right side weakness  . UTI (urinary tract infection) 02/2015    PAST SURGICAL HISTORY: Past Surgical History:  Procedure Laterality Date  . ABDOMINAL HYSTERECTOMY  1980's  . APPENDECTOMY  1980's  . CHOLECYSTECTOMY  1993  . EYE SURGERY  2009  . FOOT SURGERY    . JOINT REPLACEMENT  2012   Hip, Knee  . REVERSE SHOULDER ARTHROPLASTY  12/07/2011   Procedure: REVERSE SHOULDER ARTHROPLASTY;  Surgeon: Marin Shutter, MD;  Location: [place];  Service: Orthopedics;  Laterality: Left;  left total reverse shoulder  . TONSILLECTOMY  1951    FAMILY HISTORY: Family History  Problem Relation Age of Onset  . Anesthesia problems Neg Hx   . CAD Father 1  . Stroke Mother 29  . Heart attack Father   . Hypertension Mother     SOCIAL HISTORY: Social History   Socioeconomic History  . Marital status: Married    Spouse name: Denyse Amass  . Number of children: 4  . Years of education: 9th  . Highest education level: Not on file  Social Needs  . Financial resource strain: Not on file  . Food insecurity - worry: Not on file  . Food insecurity - inability: Not on file  . Transportation needs - medical: Not on file  . Transportation needs - non-medical: Not on file  Occupational History  . Occupation: Chartered certified accountant    Comment: Retired  Tobacco Use  . Smoking status: Never Smoker  . Smokeless tobacco: Never Used  Substance and Sexual Activity  . Alcohol use: No    Alcohol/week: 0.0 oz  . Drug use: No  . Sexual activity: Not on file  Other Topics Concern  . Not on file  Social History Narrative   Son recently died of cancer.  Lives at home with husband Thomes Cake) .  Martha Olsen has 3 children .    Education 10 th grade.   Right handed.   Caffeine use: 1  cup coffee/ day            PHYSICAL EXAM  Vitals:   07/26/17 0929  BP: (!) 173/73  Pulse: 68  Weight: 194 lb 3.2 oz (88.1 kg)  Height: _0  (1.626 m)   Body mass index is 33.33 kg/m.  MMSE - Mini Mental State Exam 07/26/2017 07/26/2016 01/24/2016  Orientation to time _0 Orientation to Place _1 Registration _2 Attention/ Calculation _3 Recall _4 Language- name 2 objects _5 Language- repeat _6 Language- follow 3 step command _7 Language- read & follow direction _8 Write a sentence _9 Copy design 0 0 1  Total score _10 Generalized: Well developed, in no acute distress   Neurological examination  Mentation: Alert oriented to time, place, history taking. Follows all commands speech and language fluent Cranial nerve II-XII: Pupils were equal round reactive to light. Extraocular movements were full, visual field were full on confrontational test. Facial sensation and strength were normal. Uvula tongue midline. Head turning and shoulder shrug  were normal and symmetric. Motor: The motor testing reveals 5 over 5 strength of all 4 extremities. Good symmetric motor tone is noted throughout.  Sensory: Sensory testing is intact to soft touch on all 4 extremities. No evidence of extinction is noted.  Coordination: Cerebellar testing reveals good finger-nose-finger and heel-to-shin bilaterally.  Gait and station: Patient's gait is slightly unsteady.  Martha Olsen uses a cane when ambulating.  DIAGNOSTIC DATA (LABS, IMAGING, TESTING) - I reviewed patient records, labs, notes, testing and imaging myself where available.  Lab Results  Component Value Date   WBC 4.0 03/02/2015   HGB 9.4 (L) 03/02/2015   HCT 29.7 (L) 03/02/2015   MCV 90.8 03/02/2015   PLT 146 (L) 03/02/2015      Component Value Date/Time   NA 138 03/02/2015 0555   NA 142 02/09/2015 1021   K 3.1 (L) 03/02/2015 0555   CL 106 03/02/2015 0555   CO2 24 03/02/2015 0555    GLUCOSE 110 (H) 03/02/2015 0555   BUN 5 (L) 03/02/2015 0555   BUN 17 02/09/2015 1021   CREATININE 0.56 03/02/2015 0555   CALCIUM 7.9 (L) 03/02/2015 0555   PROT 5.3 (L) 03/02/2015 0555   PROT 6.4 02/09/2015 1021   ALBUMIN 2.8 (L) 03/02/2015 0555   ALBUMIN 4.3 02/09/2015 1021   AST 42 (H) 03/02/2015 0555   ALT 22 03/02/2015 0555   ALKPHOS 54 03/02/2015 0555   BILITOT 1.1 03/02/2015 0555   BILITOT 0.9 02/09/2015 1021   GFRNONAA >60 03/02/2015 0555   GFRAA >60 03/02/2015 0555   Lab Results  Component Value Date   CHOL 117 10/14/2012   HDL 42 10/14/2012   LDLCALC 40 10/14/2012   TRIG 175 (H) 10/14/2012   CHOLHDL 2.8 10/14/2012        ASSESSMENT AND PLAN 82 y.o. year old female  has a past medical history of Anxiety, Dementia, Depression, Diabetes mellitus (2011), Headache(784.0), History of echocardiogram, Hypertension, Seizure ([place]), Stroke ([place]) (2002), and UTI (urinary tract infection) (02/2015). here with:  1.  Seizures 2.  Memory disturbance  The patient's memory score has declined since Martha Olsen visit 1 year ago.  Martha Olsen will continue on Namenda.  Martha Olsen has had any additional seizure events therefore Martha Olsen will continue on Keppra.  Martha Olsen is advised that if Martha Olsen symptoms worsen or Martha Olsen develops new symptoms Martha Olsen should let us know.  Martha Olsen will follow-up in 6 months or sooner if needed.   Ward Givens, MSN, NP-C 07/26/2017, 9:47 AM [facility] [address] [place], [place] [address] [phone]

## 2017-07-26 NOTE — Patient Instructions (Signed)
Your Plan:  Continue Keppra and namenda Memory score has declined slightly since the last visit If your symptoms worsen or you develop new symptoms please let us know.   Thank you for coming to see [place] at [facility]. I hope we have been able to provide you high quality care today.  You may receive a patient satisfaction survey over the next few weeks. We would appreciate your feedback and comments so that we may continue to improve ourselves and the health of our patients.

## 2017-07-26 NOTE — Progress Notes (Signed)
I have reviewed and agreed above plan. 

## 2017-08-21 DIAGNOSIS — F332 Major depressive disorder, recurrent severe without psychotic features: Secondary | ICD-10-CM | POA: Diagnosis not present

## 2017-09-12 ENCOUNTER — Other Ambulatory Visit: Payer: Self-pay | Admitting: *Deleted

## 2017-09-12 MED ORDER — SERTRALINE HCL 50 MG PO TABS: 75.0000 mg | ORAL_TABLET | Freq: Every day | ORAL | 1 refills | Status: DC

## 2017-09-26 DIAGNOSIS — I10 Essential (primary) hypertension: Secondary | ICD-10-CM | POA: Diagnosis not present

## 2017-09-26 DIAGNOSIS — E7800 Pure hypercholesterolemia, unspecified: Secondary | ICD-10-CM | POA: Diagnosis not present

## 2017-09-26 DIAGNOSIS — E1142 Type 2 diabetes mellitus with diabetic polyneuropathy: Secondary | ICD-10-CM | POA: Diagnosis not present

## 2017-09-26 DIAGNOSIS — M150 Primary generalized (osteo)arthritis: Secondary | ICD-10-CM | POA: Diagnosis not present

## 2017-09-28 ENCOUNTER — Other Ambulatory Visit: Payer: Self-pay | Admitting: *Deleted

## 2017-09-28 MED ORDER — CARVEDILOL 6.25 MG PO TABS: 6.2500 mg | ORAL_TABLET | Freq: Two times a day (BID) | ORAL | 3 refills | Status: DC

## 2017-09-28 MED ORDER — LEVETIRACETAM 500 MG PO TABS: ORAL_TABLET | ORAL | 3 refills | Status: DC

## 2017-10-22 ENCOUNTER — Other Ambulatory Visit: Payer: Self-pay | Admitting: Neurology

## 2017-11-03 ENCOUNTER — Other Ambulatory Visit: Payer: Self-pay | Admitting: Neurology

## 2017-11-03 DIAGNOSIS — E1142 Type 2 diabetes mellitus with diabetic polyneuropathy: Secondary | ICD-10-CM | POA: Diagnosis not present

## 2017-12-06 DIAGNOSIS — F332 Major depressive disorder, recurrent severe without psychotic features: Secondary | ICD-10-CM | POA: Diagnosis not present

## 2017-12-23 DIAGNOSIS — E1142 Type 2 diabetes mellitus with diabetic polyneuropathy: Secondary | ICD-10-CM | POA: Diagnosis not present

## 2018-01-15 ENCOUNTER — Ambulatory Visit
Admission: RE | Admit: 2018-01-15 | Discharge: 2018-01-15 | Disposition: A | Discharge status: Home / Self Care | Payer: Medicare Other | Source: Ambulatory Visit | Attending: Family Medicine | Admitting: Family Medicine

## 2018-01-15 ENCOUNTER — Other Ambulatory Visit: Payer: Self-pay | Admitting: Family Medicine

## 2018-01-15 DIAGNOSIS — M4184 Other forms of scoliosis, thoracic region: Secondary | ICD-10-CM | POA: Diagnosis not present

## 2018-01-15 DIAGNOSIS — B351 Tinea unguium: Secondary | ICD-10-CM | POA: Diagnosis not present

## 2018-01-15 DIAGNOSIS — M549 Dorsalgia, unspecified: Secondary | ICD-10-CM | POA: Diagnosis not present

## 2018-01-15 DIAGNOSIS — N3000 Acute cystitis without hematuria: Secondary | ICD-10-CM | POA: Diagnosis not present

## 2018-01-15 DIAGNOSIS — M47814 Spondylosis without myelopathy or radiculopathy, thoracic region: Secondary | ICD-10-CM | POA: Diagnosis not present

## 2018-01-28 ENCOUNTER — Encounter: Payer: Self-pay | Admitting: Adult Health

## 2018-01-28 ENCOUNTER — Ambulatory Visit: Payer: Medicare Other | Admitting: Adult Health

## 2018-01-28 VITALS — BP 177/70 | HR 84 | Ht 64.0 in | Wt 196.5 lb

## 2018-01-28 DIAGNOSIS — R413 Other amnesia: Secondary | ICD-10-CM | POA: Diagnosis not present

## 2018-01-28 DIAGNOSIS — R569 Unspecified convulsions: Secondary | ICD-10-CM | POA: Diagnosis not present

## 2018-01-28 NOTE — Patient Instructions (Signed)
Your Plan:  Continue Namenda-memory score is stable. Continue Keppra for seizures If you have any seizure events please let us know If your symptoms worsen or you develop new symptoms please let us know.   Thank you for coming to see [place] at [facility]. I hope we have been able to provide you high quality care today.  You may receive a patient satisfaction survey over the next few weeks. We would appreciate your feedback and comments so that we may continue to improve ourselves and the health of our patients.

## 2018-01-28 NOTE — Progress Notes (Signed)
PATIENT: Martha Olsen DOB: March 04, 1936  REASON FOR VISIT: follow up HISTORY FROM: patient  HISTORY OF PRESENT ILLNESS: HISTORY (YAN): Martha Olsen,seen in refer by her primary care physician Dr. Donavan Burnet for evaluation of memory loss.  She has past medical history of HTN, s/p CVA right basal ganglia 11/2001, dyslipidemia, DM, anxiety/depression, s/p appendectomy, s/p hysterectomy, DJD, s/p left hip replacement 03/2002, left knee replacement 01/2011, foot surgery and reverse left shoulder arthroplasty 11/2011.  She had 9 years of education, she was a Chartered certified accountant, she worked at [employer], [employer], retried at age [place] care of her grandchildren. She denies family history of memory loss. Mother died at age 46 from stroke.  I saw her initially following her hospital discharge in March 2013,  In October 13 2011, she was at home, sitting at the rocking chair, was noted by her family fell over the chair, had generalized tonic-clonic seizure, patient has no warning signs, no collection of the event, EMS was called, she was taken by ambulance to the hospital, she could not remember waking up in the hospital confused, she was put on Keppra 500 mg twice a day, tolerating it well, there was no recurrent seizure.  EEG was normal. MRI of the brainin 2013has demonstrated old basal ganglion, and coronal radiata stroke, no acute lesions,  She complains of headache everyday, right temporal regions, light noise is bothersome,   She has quit driving since 8250, husband retired, she is active at home, house work  She has no recurrent seizures, tolerating Keppra 500 mg twice a day without significant side effect, she has increased gait difficulty because of joints, knee pain, She has been complaining of a year history of worsening headaches, bilateral frontal, vortex region, she has to take ibuprofen 200 mg 2-3 tablets each time, sometimes two dosage each day  She was given  prescription of tramadol 50 mg as neededinDecember 2015, her headache has much improved, taking tramadol about twice each week, she continue has bilateral knee pain, low back pain, gait difficulty, she also has nocturia, frequent awakening, loss snoring, excessive daytime sleepiness, fatigue, sleep studies pending in August 25 2014  ESR, TSH was normal December 2015, mild elevated C-reactive protein 11.  UPDATE February 18 2015: She is accompanied by her husband, and daughter at today's clinical visit,  She had a urgent visit with Dr. Jaynee Eagles in February 09 2015 because of increased confusion, was found to have UTI,she was given prescription of Augmentin for 5 days, there was no significant change in her confusion  February 09 2015, EEG recording asymmetric slowing of the left hemisphere, with occasionally sharp transient  Patient's continue have slow worsening confusion, agitation, paranoid ideation, difficulty sleeping at nighttime, She had 10 years of education, worked two jobs most of her life, she retired at age 78, was very active until she had stroke in 2013, was noted to have declining functional status, increased memory trouble, especially since January 2016,  She also has worsening gait difficulty,worsening right knee pain, had a recent evaluation by orthopedic surgeon,she is not a candidate for right knee replacement anymore,because of worsening memory trouble, gradually increased confusion  She also complains of low back pain,worsening bowel and bladder incontinence Update May 11 2015: Her confusion continues with getting worse, she is more agitated, could not well at night time, paranoid, scared, seroquel has been helpful, she is now taking 25 mg 1 in the morning, 1 at 2:00, 2 tablets at night  She is  on polypharmacy treatment including Paxil, BuSpar, Ativan as needed, She has no recurrent seizure, taking Keppra 500 mg/1000 mg  UPDATE Aug 11 2015: She is now taking seroquel  '25mg'$  3 tabs qhs,ativan prn, but she still has trouble sleeping. She is seeing psychiatrist, taking BuSpar, she has been dealing with depression for a long time. She was previously treated as SSRI, which has been helpful  She has no recurrent seizure, taking Keppra 500/1000 mg daily  UPDATE 09/29/2015:Martha Olsen is a 82 year old female with a history of memory disorder and insomnia. She returns today for an evaluation. The patient did follow up with a psychiatrist. She was taken off of Seroquel. The patient states that she continues to have depression-- she has remained on Zoloft 50 mg daily. The patient has a hard time sleeping at night. However the Seroquel did not give her much benefit either. She was on Aricept for her memory but this caused itching and therefore the medication was discontinued by her psychiatrist. The patient is able to complete most ADLs independently. She lives at home with her husband. The patient is unable to get out of the house much because she does not drive and her husband is not able. She does feel that this may contribute to her depression. She denies any new neurological symptoms. She returns today for an evaluation.  UPDATE Jan 10th 2018: She is with her husband at today's clinical visit, she has been doing very well over the past 6 months, tolerating current medications, does not want to make any changes, no recurrent seizure, on Keppra 500/1000  UPDATE 07/26/17 Martha Olsen is a 82 year old female with a history of memory disturbance and seizures.  She returns today for follow-up.  She continues on Namenda for her memory.  She is now living with her daughter and.  Her husband passed away last 09/29/2022.  She is able to complete all ADLs independently.  She does need assistance getting in and out of the shower.  Her daughter does all of her finances.  She reports that she has not had any additional seizure events.  She remains on Keppra.  Overall she feels that she is doing  well.  She returns today for evaluation.    Today 01/28/18 :   Martha Olsen is a 82 year old female with a history of memory disturbance and seizures.  He remains on Keppra .  She denies any seizure events.  She continues to live with her daughter Lelon Frohlich. she feels that her memory has remained stable.  She continues on Namenda.  She is able to complete all ADLs with minimal assistance.  She occasionally needs help dressing..  Her daughter will sometimes assist her with getting in and out of the shower.  However the patient can bathe herself.  The patient denies any trouble sleeping.  Denies hallucinations or agitation.  Reports good appetite.  She returns today for an evaluation.   REVIEW OF SYSTEMS: Out of a complete 14 system review of symptoms, the patient complains only of the following symptoms, and all other reviewed systems are negative.  See HPI  ALLERGIES: Allergies  Allergen Reactions  . Codeine Hives    HOME MEDICATIONS: Outpatient Medications Prior to Visit  Medication Sig Dispense Refill  . aspirin 81 MG chewable tablet Chew 1 tablet (81 mg total) by mouth daily. 30 tablet 2  . atorvastatin (LIPITOR) 40 MG tablet Take 40 mg by mouth daily.    . busPIRone (BUSPAR) 15 MG tablet Take 15 mg by  mouth 3 (three) times daily.    . carvedilol (COREG) 6.25 MG tablet Take 1 tablet (6.25 mg total) by mouth 2 (two) times daily with a meal. 180 tablet 3  . glimepiride (AMARYL) 2 MG tablet Take 2 mg by mouth daily before breakfast.    . levETIRAcetam (KEPPRA) 500 MG tablet 1 tablet every morning, 2 tablets every night 270 tablet 3  . lisinopril-hydrochlorothiazide (PRINZIDE,ZESTORETIC) 20-12.5 MG per tablet Take 1 tablet by mouth daily.    Marland Kitchen LORazepam (ATIVAN) 2 MG tablet Take 2 mg by mouth at bedtime.     . memantine (NAMENDA) 10 MG tablet TAKE 1 TABLET BY MOUTH 2 TIMES DAILY 180 tablet 3  . metFORMIN (GLUCOPHAGE) 1000 MG tablet Take 1,000 mg by mouth 2 (two) times daily with a meal.   1    . nitroGLYCERIN (NITROSTAT) 0.4 MG SL tablet Place 1 tablet (0.4 mg total) under the tongue every 5 (five) minutes as needed for chest pain. 25 tablet 3  . PROAIR HFA 108 (90 BASE) MCG/ACT inhaler Inhale 1-2 puffs into the lungs every 6 (six) hours as needed for wheezing or shortness of breath.     . ranitidine (ZANTAC) 150 MG tablet Take 150 mg by mouth 2 (two) times daily as needed for heartburn.    . sertraline (ZOLOFT) 50 MG tablet Take 1.5 tablets (75 mg total) by mouth daily. 135 tablet 1   No facility-administered medications prior to visit.     PAST MEDICAL HISTORY: Past Medical History:  Diagnosis Date  . Anxiety   . Dementia   . Depression   . Diabetes mellitus 2011  . Headache(784.0)   . History of echocardiogram    Echo 5/16: EF 55-60%, normal wall motion, grade 1 diastolic dysfunction  . Hypertension   . Seizure ([place])   . Stroke [facility]) 2002   right side weakness  . UTI (urinary tract infection) 02/2015    PAST SURGICAL HISTORY: Past Surgical History:  Procedure Laterality Date  . ABDOMINAL HYSTERECTOMY  1980's  . APPENDECTOMY  1980's  . CHOLECYSTECTOMY  1993  . EYE SURGERY  2009  . FOOT SURGERY    . JOINT REPLACEMENT  2012   Hip, Knee  . REVERSE SHOULDER ARTHROPLASTY  12/07/2011   Procedure: REVERSE SHOULDER ARTHROPLASTY;  Surgeon: Marin Shutter, MD;  Location: [facility];  Service: Orthopedics;  Laterality: Left;  left total reverse shoulder  . TONSILLECTOMY  1951    FAMILY HISTORY: Family History  Problem Relation Age of Onset  . Anesthesia problems Neg Hx   . CAD Father 45  . Stroke Mother 34  . Heart attack Father   . Hypertension Mother     SOCIAL HISTORY: Social History   Socioeconomic History  . Marital status: Married    Spouse name: Denyse Amass  . Number of children: 4  . Years of education: 9th  . Highest education level: Not on file  Occupational History  . 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 Financial resource  strain: Not on file  . Food insecurity:    Worry: Not on file    Inability: Not on file  . Transportation needs:    Medical: Not on file    Non-medical: Not on file  Tobacco Use  . Smoking status: Never Smoker  . Smokeless tobacco: Never Used  Substance and Sexual Activity  . Alcohol use: No    Alcohol/week: 0.0 oz  . Drug use: No  .  Sexual activity: Not on file  Lifestyle  . Physical activity:    Days per week: Not on file    Minutes per session: Not on file  . Stress: Not on file  Relationships  . Social connections:    Talks on phone: Not on file    Gets together: Not on file    Attends religious service: Not on file    Active member of club or organization: Not on file    Attends meetings of clubs or organizations: Not on file    Relationship status: Not on file  . Intimate partner violence:    Fear of current or ex partner: Not on file    Emotionally abused: Not on file    Physically abused: Not on file    Forced sexual activity: Not on file  Other Topics Concern  . Not on file  Social History Narrative   Son recently died of cancer.  Lives at home with husband Thomes Cake) .  She has 3 children .    Education 10 th grade.   Right handed.   Caffeine use: 1 cup coffee/ day            PHYSICAL EXAM  Vitals:   01/28/18 0912  BP: (!) 177/70  Pulse: 84  Weight: 196 lb 8 oz (89.1 kg)  Height: '5\' 4"'$  (1.626 m)   Body mass index is 33.73 kg/m.   MMSE - Mini Mental State Exam 01/28/2018 07/26/2017 07/26/2016  Orientation to time '3 4 5  '$ Orientation to Place '5 4 5  '$ Registration '3 3 3  '$ Attention/ Calculation '5 2 5  '$ Recall '3 2 2  '$ Language- name 2 objects '2 1 2  '$ Language- repeat '1 1 1  '$ Language- follow 3 step command '3 3 3  '$ Language- read & follow direction '1 1 1  '$ Write a sentence '1 1 1  '$ Copy design 1 0 0  Total score '28 22 28    '$ Generalized: Well developed, in no acute distress   Neurological examination  Mentation: Alert oriented to time, place, history  taking. Follows all commands speech and language fluent Cranial nerve II-XII: Pupils were equal round reactive to light. Extraocular movements were full, visual field were full on confrontational test. Facial sensation and strength were normal. Uvula tongue midline. Head turning and shoulder shrug  were normal and symmetric. Motor: The motor testing reveals 5 over 5 strength of all 4 extremities. Good symmetric motor tone is noted throughout.  Sensory: Sensory testing is intact to soft touch on all 4 extremities. No evidence of extinction is noted.  Coordination: Cerebellar testing reveals good finger-nose-finger and heel-to-shin bilaterally.  Gait and station: Patient uses a walker when ambulating.  Gait is slightly unsteady.   Reflexes: Deep tendon reflexes are symmetric and normal bilaterally.   DIAGNOSTIC DATA (LABS, IMAGING, TESTING) - I reviewed patient records, labs, notes, testing and imaging myself where available.  Lab Results  Component Value Date   WBC 4.0 03/02/2015   HGB 9.4 (L) 03/02/2015   HCT 29.7 (L) 03/02/2015   MCV 90.8 03/02/2015   PLT 146 (L) 03/02/2015      Component Value Date/Time   NA 138 03/02/2015 0555   NA 142 02/09/2015 1021   K 3.1 (L) 03/02/2015 0555   CL 106 03/02/2015 0555   CO2 24 03/02/2015 0555   GLUCOSE 110 (H) 03/02/2015 0555   BUN 5 (L) 03/02/2015 0555   BUN 17 02/09/2015 1021   CREATININE 0.56 03/02/2015 0555  CALCIUM 7.9 (L) 03/02/2015 0555   PROT 5.3 (L) 03/02/2015 0555   PROT 6.4 02/09/2015 1021   ALBUMIN 2.8 (L) 03/02/2015 0555   ALBUMIN 4.3 02/09/2015 1021   AST 42 (H) 03/02/2015 0555   ALT 22 03/02/2015 0555   ALKPHOS 54 03/02/2015 0555   BILITOT 1.1 03/02/2015 0555   BILITOT 0.9 02/09/2015 1021   GFRNONAA >60 03/02/2015 0555   GFRAA >60 03/02/2015 0555   Lab Results  Component Value Date   CHOL 117 10/14/2012   HDL 42 10/14/2012   LDLCALC 40 10/14/2012   TRIG 175 (H) 10/14/2012   CHOLHDL 2.8 10/14/2012   Lab Results    Component Value Date   HGBA1C 7.0 (H) 10/12/2012   No results found for: [id number] Lab Results  Component Value Date   TSH 2.030 07/14/2014      ASSESSMENT AND PLAN 82 y.o. year old female  has a past medical history of Anxiety, Dementia, Depression, Diabetes mellitus (2011), Headache(784.0), History of echocardiogram, Hypertension, Seizure ([place]), Stroke ([place]) (2002), and UTI (urinary tract infection) (02/2015). here with:   1.  Memory disturbance 2.  Seizures   Overall the patient is doing well.  Memory score has remained stable.  She will continue on Namenda.  She will continue on Keppra for seizures.  She is advised that if her symptoms worsen or she develops new symptoms she should let us know.  She will follow-up in 6 months or sooner if needed.    Ward Givens, MSN, NP-C 01/28/2018, 9:28 AM [facility] [address] [place], [place] [address] [phone]

## 2018-02-01 ENCOUNTER — Ambulatory Visit: Payer: Medicare Other | Admitting: Podiatry

## 2018-02-01 ENCOUNTER — Ambulatory Visit: Payer: Medicare Other | Admitting: Sports Medicine

## 2018-02-01 ENCOUNTER — Encounter: Payer: Self-pay | Admitting: Sports Medicine

## 2018-02-01 VITALS — BP 146/63 | HR 81 | Temp 99.5°F | Resp 16

## 2018-02-01 DIAGNOSIS — M79674 Pain in right toe(s): Secondary | ICD-10-CM

## 2018-02-01 DIAGNOSIS — M79675 Pain in left toe(s): Secondary | ICD-10-CM | POA: Diagnosis not present

## 2018-02-01 DIAGNOSIS — E119 Type 2 diabetes mellitus without complications: Secondary | ICD-10-CM

## 2018-02-01 DIAGNOSIS — L603 Nail dystrophy: Secondary | ICD-10-CM | POA: Diagnosis not present

## 2018-02-01 NOTE — Progress Notes (Signed)
   Subjective:    Patient ID: Martha Olsen, female    DOB: 28-Oct-1935, 82 y.o.   MRN: [id number]  HPI    Review of Systems  All other systems reviewed and are negative.      Objective:   Physical Exam        Assessment & Plan:

## 2018-02-01 NOTE — Patient Instructions (Signed)

## 2018-02-01 NOTE — Progress Notes (Signed)
Subjective: Martha Olsen is a 82 y.o. female patient with history of diabetes who presents to office today complaining of long,mildly painful nails especially at right 3rd toe and left 2nd toe that is overgrown and thickened that is sore with shoes; unable to trim. Patient states that the glucose reading this morning was 123 mg/dl. Patient denies any new changes in medication or new problems. Patient denies any new cramping, numbness, burning or tingling in the legs. Admits to joint pain all over.  Patient is assisted by daughter.   Review of Systems  Musculoskeletal: Positive for joint pain.  All other systems reviewed and are negative.    Patient Active Problem List   Diagnosis Date Noted  . Fall 03/01/2015  . Pressure ulcer 03/01/2015  . Blood poisoning   . Dementia   . Essential hypertension 02/28/2015  . GERD (gastroesophageal reflux disease) 02/28/2015  . UTI (lower urinary tract infection) 02/28/2015  . Sepsis ([place]) 02/28/2015  . Acute encephalopathy 02/28/2015  . Excessive daytime sleepiness 09/03/2014  . Abnormality of gait 08/18/2014  . Urinary urgency 08/18/2014  . Sleep apnea 07/14/2014  . CAD (coronary artery disease) 12/11/2012  . Abnormal ECG 11/14/2012  . Stroke ([place])   . Hypertension   . Anxiety   . Depression   . Seizure ([place])   . Seizure disorder ([place]) 10/12/2012  . Elevated troponin I level 10/12/2012  . Diarrhea 10/12/2012  . Pneumonia 10/12/2012  . Hypokalemia 10/12/2012  . DM (diabetes mellitus) ([place]) 10/12/2012  . HTN (hypertension) 10/12/2012  . Dyslipidemia 10/12/2012  . Rotator cuff tear arthropathy, left 12/08/2011   Current Outpatient Medications on File Prior to Visit  Medication Sig Dispense Refill  . aspirin 81 MG chewable tablet Chew 1 tablet (81 mg total) by mouth daily. 30 tablet 2  . atorvastatin (LIPITOR) 40 MG tablet Take 40 mg by mouth daily.    . busPIRone (BUSPAR) 15 MG tablet Take 15 mg by mouth 3 (three) times daily.    .  carvedilol (COREG) 6.25 MG tablet Take 1 tablet (6.25 mg total) by mouth 2 (two) times daily with a meal. 180 tablet 3  . glimepiride (AMARYL) 2 MG tablet Take 2 mg by mouth daily before breakfast.    . levETIRAcetam (KEPPRA) 500 MG tablet 1 tablet every morning, 2 tablets every night 270 tablet 3  . lisinopril-hydrochlorothiazide (PRINZIDE,ZESTORETIC) 20-12.5 MG per tablet Take 1 tablet by mouth daily.    Marland Kitchen LORazepam (ATIVAN) 2 MG tablet Take 2 mg by mouth at bedtime.     . memantine (NAMENDA) 10 MG tablet TAKE 1 TABLET BY MOUTH 2 TIMES DAILY 180 tablet 3  . metFORMIN (GLUCOPHAGE) 1000 MG tablet Take 1,000 mg by mouth 2 (two) times daily with a meal.   1  . nitroGLYCERIN (NITROSTAT) 0.4 MG SL tablet Place 1 tablet (0.4 mg total) under the tongue every 5 (five) minutes as needed for chest pain. 25 tablet 3  . PROAIR HFA 108 (90 BASE) MCG/ACT inhaler Inhale 1-2 puffs into the lungs every 6 (six) hours as needed for wheezing or shortness of breath.     . ranitidine (ZANTAC) 150 MG tablet Take 150 mg by mouth 2 (two) times daily as needed for heartburn.    . sertraline (ZOLOFT) 50 MG tablet Take 1.5 tablets (75 mg total) by mouth daily. 135 tablet 1   No current facility-administered medications on file prior to visit.    Allergies  Allergen Reactions  . Codeine Hives  No results found for this or any previous visit (from the past 2160 hour(s)).  Objective: General: Patient is awake, alert, and oriented x 3 and in no acute distress.  Integument: Skin is warm, dry and supple bilateral. Nails are tender, long, thickened and dystrophic with subungual debris, consistent with onychomycosis, with right 3rd and left 2nd toenail with rams horn appearance. No signs of infection.+ callus sub met 1 on right. Remaining integument unremarkable.  Vasculature:  Dorsalis Pedis pulse 2/4 bilateral. Posterior Tibial pulse  1/4 bilateral. Capillary fill time <5 sec 1-5 on left and 1,3-5 on right. Scant hair  growth to the level of the digits. Temperature gradient within normal limits. Mild varicosities present bilateral. Trace ankle edema present bilateral.   Neurology: The patient has intact sensation measured with a 5.07/10g Semmes Weinstein Monofilament at all pedal sites bilateral .   Musculoskeletal: Asymptomatic hammertoe pedal deformities noted on left, s/p R 2nd toe amp on right with digital deformity. No tenderness with calf compression bilateral.  Assessment and Plan: Problem List Items Addressed This Visit    None    Visit Diagnoses    Nail dystrophy    -  Primary   Toe pain, bilateral       Diabetes mellitus without complication ([place])          -Examined patient. -Discussed and educated patient on diabetic foot care, especially with  regards to the vascular, neurological and musculoskeletal systems.  -Stressed the importance of good glycemic control and the detriment of not  controlling glucose levels in relation to the foot. -Discussed treatment alternatives and plan of care; Explained permanent Right 3rd and left 2nd toenail avulsion and post procedure course to patient. - After a verbal consent, injected 3 ml of a 50:50 mixture of 2% plain  lidocaine and 0.5% plain marcaine in a normal digtial block fashion. Next, a  betadine prep was performed. Anesthesia was tested and found to be appropriate.  The offending Left 2nd toe and right 3rd toenail was then incised from the hyponychium to the epinychium. The nails were removed and cleared from the field. The area was curretted for any remaining nail or spicules. Phenol application performed and the area was then flushed with alcohol and dressed with antibiotic cream and a dry sterile dressing. -Patient was instructed to leave the dressing intact for today and begin soaking  in a weak solution of epsom salt and water tomorrow. Patient was instructed to  soak for 15 minutes each day and apply neosporin and a gauze or bandaid dressing  each day. -Patient was instructed to monitor the toe for signs of infection and return to office if toe becomes red, hot or swollen. -Continue with current antibiotics that she is already on for UTI -Mechanically debrided callus using sterile chisel blade sub met 1 on right and all remaining nails using nail nipper without incident. -Answered all patient questions -Patient to return in 2 weeks for nail check -Patient advised to call the office if any problems or questions arise in the meantime.  Landis Martins, DPM

## 2018-02-02 ENCOUNTER — Encounter: Payer: Self-pay | Admitting: Sports Medicine

## 2018-02-05 NOTE — Progress Notes (Signed)
I have reviewed and agreed above plan. 

## 2018-02-08 DIAGNOSIS — E1142 Type 2 diabetes mellitus with diabetic polyneuropathy: Secondary | ICD-10-CM | POA: Diagnosis not present

## 2018-02-22 ENCOUNTER — Encounter: Payer: Self-pay | Admitting: Sports Medicine

## 2018-02-22 ENCOUNTER — Ambulatory Visit (INDEPENDENT_AMBULATORY_CARE_PROVIDER_SITE_OTHER): Payer: Medicare Other | Admitting: Sports Medicine

## 2018-02-22 DIAGNOSIS — Z9889 Other specified postprocedural states: Secondary | ICD-10-CM

## 2018-02-22 DIAGNOSIS — M79674 Pain in right toe(s): Secondary | ICD-10-CM

## 2018-02-22 DIAGNOSIS — M79675 Pain in left toe(s): Secondary | ICD-10-CM

## 2018-02-22 DIAGNOSIS — E119 Type 2 diabetes mellitus without complications: Secondary | ICD-10-CM

## 2018-02-22 NOTE — Progress Notes (Signed)
Subjective: Martha Olsen is a 82 y.o. female patient returns to office today for follow up evaluation after having right third and left second total nail avulsion performed last visit on 02/01/2018. Patient has been soaking using epsom salt and applying topical antibiotic covered with bandaid daily with assistance from daughter without any issues.  Patient is diabetic denies any changes with blood sugar or any changes with medications since last encounter. Patient deniesfever/chills/nausea/vomitting/any other related constitutional symptoms at this time.  Patient Active Problem List   Diagnosis Date Noted  . Fall 03/01/2015  . Pressure ulcer 03/01/2015  . Blood poisoning   . Dementia   . Essential hypertension 02/28/2015  . GERD (gastroesophageal reflux disease) 02/28/2015  . UTI (lower urinary tract infection) 02/28/2015  . Sepsis ([place]) 02/28/2015  . Acute encephalopathy 02/28/2015  . Excessive daytime sleepiness 09/03/2014  . Abnormality of gait 08/18/2014  . Urinary urgency 08/18/2014  . Sleep apnea 07/14/2014  . CAD (coronary artery disease) 12/11/2012  . Abnormal ECG 11/14/2012  . Stroke ([place])   . Hypertension   . Anxiety   . Depression   . Seizure ([place])   . Seizure disorder ([place]) 10/12/2012  . Elevated troponin I level 10/12/2012  . Diarrhea 10/12/2012  . Pneumonia 10/12/2012  . Hypokalemia 10/12/2012  . DM (diabetes mellitus) ([place]) 10/12/2012  . HTN (hypertension) 10/12/2012  . Dyslipidemia 10/12/2012  . Rotator cuff tear arthropathy, left 12/08/2011    Current Outpatient Medications on File Prior to Visit  Medication Sig Dispense Refill  . aspirin 81 MG chewable tablet Chew 1 tablet (81 mg total) by mouth daily. 30 tablet 2  . atorvastatin (LIPITOR) 40 MG tablet Take 40 mg by mouth daily.    . busPIRone (BUSPAR) 15 MG tablet Take 15 mg by mouth 3 (three) times daily.    . carvedilol (COREG) 6.25 MG tablet Take 1 tablet (6.25 mg total) by mouth 2 (two) times daily  with a meal. 180 tablet 3  . glimepiride (AMARYL) 2 MG tablet Take 2 mg by mouth daily before breakfast.    . levETIRAcetam (KEPPRA) 500 MG tablet 1 tablet every morning, 2 tablets every night 270 tablet 3  . lisinopril-hydrochlorothiazide (PRINZIDE,ZESTORETIC) 20-12.5 MG per tablet Take 1 tablet by mouth daily.    Marland Kitchen LORazepam (ATIVAN) 2 MG tablet Take 2 mg by mouth at bedtime.     . memantine (NAMENDA) 10 MG tablet TAKE 1 TABLET BY MOUTH 2 TIMES DAILY 180 tablet 3  . metFORMIN (GLUCOPHAGE) 1000 MG tablet Take 1,000 mg by mouth 2 (two) times daily with a meal.   1  . nitroGLYCERIN (NITROSTAT) 0.4 MG SL tablet Place 1 tablet (0.4 mg total) under the tongue every 5 (five) minutes as needed for chest pain. 25 tablet 3  . PROAIR HFA 108 (90 BASE) MCG/ACT inhaler Inhale 1-2 puffs into the lungs every 6 (six) hours as needed for wheezing or shortness of breath.     . ranitidine (ZANTAC) 150 MG tablet Take 150 mg by mouth 2 (two) times daily as needed for heartburn.    . sertraline (ZOLOFT) 50 MG tablet Take 1.5 tablets (75 mg total) by mouth daily. 135 tablet 1   No current facility-administered medications on file prior to visit.     Allergies  Allergen Reactions  . Codeine Hives    Objective:  General: Well developed, nourished, in no acute distress, alert and oriented x3   Dermatology: Skin is warm, dry and supple bilateral.  Left second  toe and right third toe nail bed appears to be clean, dry, with mild granular tissue and surrounding eschar/scab. (-) Erythema. (-) Edema. (-) serosanguous drainage present. The remaining nails appear unremarkable at this time or free from acute symptoms.  Mild reactive callus at the plantar tuft of the third toe and sub-met one on the right.  There are no other lesions or other signs of infection  present.  Neurovascular status: Unchanged.  No lower extremity swelling; No pain with calf compression bilateral.  Musculoskeletal: Minimal tenderness to  palpation of the right third and left second toenail beds. Muscular strength within normal limits bilateral for patient status unchanged from prior.  Multiple digital deformity.  Assesement and Plan: Problem List Items Addressed This Visit    None    Visit Diagnoses    S/P nail surgery    -  Primary   Toe pain, bilateral       Diabetes mellitus without complication ([place])          -Examined patient  -Cleansed nail folds and gently scrubbed with peroxide and q-tip/curetted away eschar at site and applied antibiotic cream covered with bandaid.  -Discussed plan of care with patient. -Patient to now begin soaking in a weak solution of Epsom salt and warm water once daily. Patient was instructed to soak for 15-20 minutes each day until the toe appears normal and there is no drainage, redness, tenderness, or swelling at the procedure site, and apply neosporin and a gauze or bandaid dressing each day as needed. May leave open to air at night. -Educated patient on long term care after nail surgery. -Patient was instructed to monitor the toe for reoccurrence and signs of infection; Patient advised to return to office or go to ER if toe becomes red, hot or swollen. -Patient is to return in 3 months for routine diabetic foot care or sooner if problems arise.  Landis Martins, DPM

## 2018-03-06 DIAGNOSIS — F332 Major depressive disorder, recurrent severe without psychotic features: Secondary | ICD-10-CM | POA: Diagnosis not present

## 2018-03-07 DIAGNOSIS — F0280 Dementia in other diseases classified elsewhere without behavioral disturbance: Secondary | ICD-10-CM | POA: Diagnosis not present

## 2018-03-07 DIAGNOSIS — F411 Generalized anxiety disorder: Secondary | ICD-10-CM | POA: Diagnosis not present

## 2018-03-07 DIAGNOSIS — F5101 Primary insomnia: Secondary | ICD-10-CM | POA: Diagnosis not present

## 2018-03-07 DIAGNOSIS — F331 Major depressive disorder, recurrent, moderate: Secondary | ICD-10-CM | POA: Diagnosis not present

## 2018-03-11 ENCOUNTER — Other Ambulatory Visit: Payer: Self-pay | Admitting: *Deleted

## 2018-03-11 MED ORDER — SERTRALINE HCL 50 MG PO TABS: 75.0000 mg | ORAL_TABLET | Freq: Every day | ORAL | 1 refills | Status: DC

## 2018-04-11 DIAGNOSIS — I10 Essential (primary) hypertension: Secondary | ICD-10-CM | POA: Diagnosis not present

## 2018-04-11 DIAGNOSIS — M150 Primary generalized (osteo)arthritis: Secondary | ICD-10-CM | POA: Diagnosis not present

## 2018-04-11 DIAGNOSIS — E1142 Type 2 diabetes mellitus with diabetic polyneuropathy: Secondary | ICD-10-CM | POA: Diagnosis not present

## 2018-04-11 DIAGNOSIS — E7800 Pure hypercholesterolemia, unspecified: Secondary | ICD-10-CM | POA: Diagnosis not present

## 2018-04-29 ENCOUNTER — Other Ambulatory Visit: Payer: Self-pay | Admitting: Family Medicine

## 2018-04-29 DIAGNOSIS — M8580 Other specified disorders of bone density and structure, unspecified site: Secondary | ICD-10-CM

## 2018-05-20 DIAGNOSIS — E1142 Type 2 diabetes mellitus with diabetic polyneuropathy: Secondary | ICD-10-CM | POA: Diagnosis not present

## 2018-05-24 ENCOUNTER — Ambulatory Visit: Payer: Medicare Other | Admitting: Sports Medicine

## 2018-06-03 DIAGNOSIS — F5101 Primary insomnia: Secondary | ICD-10-CM | POA: Diagnosis not present

## 2018-06-03 DIAGNOSIS — F411 Generalized anxiety disorder: Secondary | ICD-10-CM | POA: Diagnosis not present

## 2018-06-03 DIAGNOSIS — F0280 Dementia in other diseases classified elsewhere without behavioral disturbance: Secondary | ICD-10-CM | POA: Diagnosis not present

## 2018-06-03 DIAGNOSIS — F332 Major depressive disorder, recurrent severe without psychotic features: Secondary | ICD-10-CM | POA: Diagnosis not present

## 2018-06-03 DIAGNOSIS — F331 Major depressive disorder, recurrent, moderate: Secondary | ICD-10-CM | POA: Diagnosis not present

## 2018-06-12 DIAGNOSIS — H524 Presbyopia: Secondary | ICD-10-CM | POA: Diagnosis not present

## 2018-06-24 ENCOUNTER — Other Ambulatory Visit: Payer: Self-pay | Admitting: Neurology

## 2018-07-01 ENCOUNTER — Ambulatory Visit
Admission: RE | Admit: 2018-07-01 | Discharge: 2018-07-01 | Disposition: A | Discharge status: Home / Self Care | Payer: Medicare Other | Source: Ambulatory Visit | Attending: Family Medicine | Admitting: Family Medicine

## 2018-07-01 DIAGNOSIS — M85851 Other specified disorders of bone density and structure, right thigh: Secondary | ICD-10-CM | POA: Diagnosis not present

## 2018-07-01 DIAGNOSIS — Z780 Asymptomatic menopausal state: Secondary | ICD-10-CM | POA: Diagnosis not present

## 2018-07-01 DIAGNOSIS — M8580 Other specified disorders of bone density and structure, unspecified site: Secondary | ICD-10-CM

## 2018-07-16 DIAGNOSIS — E1142 Type 2 diabetes mellitus with diabetic polyneuropathy: Secondary | ICD-10-CM | POA: Diagnosis not present

## 2018-07-24 ENCOUNTER — Encounter: Payer: Self-pay | Admitting: Adult Health

## 2018-08-26 ENCOUNTER — Encounter: Payer: Self-pay | Admitting: Neurology

## 2018-08-28 ENCOUNTER — Ambulatory Visit: Payer: Medicare Other | Admitting: Adult Health

## 2018-08-29 ENCOUNTER — Encounter (HOSPITAL_COMMUNITY): Payer: Self-pay

## 2018-08-29 ENCOUNTER — Emergency Department (HOSPITAL_COMMUNITY): Payer: Medicare Other

## 2018-08-29 ENCOUNTER — Emergency Department (HOSPITAL_COMMUNITY)
Admission: EM | Admit: 2018-08-29 | Discharge: 2018-08-30 | Disposition: A | Discharge status: Home / Self Care | Payer: Medicare Other | Attending: Emergency Medicine | Admitting: Emergency Medicine

## 2018-08-29 DIAGNOSIS — F5101 Primary insomnia: Secondary | ICD-10-CM | POA: Diagnosis not present

## 2018-08-29 DIAGNOSIS — R531 Weakness: Secondary | ICD-10-CM | POA: Diagnosis not present

## 2018-08-29 DIAGNOSIS — R29898 Other symptoms and signs involving the musculoskeletal system: Secondary | ICD-10-CM | POA: Diagnosis not present

## 2018-08-29 DIAGNOSIS — M6281 Muscle weakness (generalized): Secondary | ICD-10-CM | POA: Diagnosis not present

## 2018-08-29 DIAGNOSIS — Z7982 Long term (current) use of aspirin: Secondary | ICD-10-CM | POA: Diagnosis not present

## 2018-08-29 DIAGNOSIS — R270 Ataxia, unspecified: Secondary | ICD-10-CM | POA: Diagnosis not present

## 2018-08-29 DIAGNOSIS — R0602 Shortness of breath: Secondary | ICD-10-CM | POA: Diagnosis not present

## 2018-08-29 DIAGNOSIS — E119 Type 2 diabetes mellitus without complications: Secondary | ICD-10-CM | POA: Diagnosis not present

## 2018-08-29 DIAGNOSIS — I10 Essential (primary) hypertension: Secondary | ICD-10-CM | POA: Diagnosis not present

## 2018-08-29 DIAGNOSIS — F0390 Unspecified dementia without behavioral disturbance: Secondary | ICD-10-CM | POA: Insufficient documentation

## 2018-08-29 DIAGNOSIS — Z79899 Other long term (current) drug therapy: Secondary | ICD-10-CM | POA: Diagnosis not present

## 2018-08-29 DIAGNOSIS — R079 Chest pain, unspecified: Secondary | ICD-10-CM | POA: Diagnosis not present

## 2018-08-29 DIAGNOSIS — I2510 Atherosclerotic heart disease of native coronary artery without angina pectoris: Secondary | ICD-10-CM | POA: Diagnosis not present

## 2018-08-29 DIAGNOSIS — F332 Major depressive disorder, recurrent severe without psychotic features: Secondary | ICD-10-CM | POA: Diagnosis not present

## 2018-08-29 DIAGNOSIS — Z8673 Personal history of transient ischemic attack (TIA), and cerebral infarction without residual deficits: Secondary | ICD-10-CM | POA: Diagnosis not present

## 2018-08-29 DIAGNOSIS — Z7984 Long term (current) use of oral hypoglycemic drugs: Secondary | ICD-10-CM | POA: Diagnosis not present

## 2018-08-29 DIAGNOSIS — Z96649 Presence of unspecified artificial hip joint: Secondary | ICD-10-CM | POA: Diagnosis not present

## 2018-08-29 DIAGNOSIS — F0280 Dementia in other diseases classified elsewhere without behavioral disturbance: Secondary | ICD-10-CM | POA: Diagnosis not present

## 2018-08-29 DIAGNOSIS — R202 Paresthesia of skin: Secondary | ICD-10-CM | POA: Diagnosis not present

## 2018-08-29 DIAGNOSIS — Z96659 Presence of unspecified artificial knee joint: Secondary | ICD-10-CM | POA: Diagnosis not present

## 2018-08-29 DIAGNOSIS — F411 Generalized anxiety disorder: Secondary | ICD-10-CM | POA: Diagnosis not present

## 2018-08-29 DIAGNOSIS — I639 Cerebral infarction, unspecified: Secondary | ICD-10-CM | POA: Diagnosis not present

## 2018-08-29 LAB — CBC
HCT: 41 % (ref 36.0–46.0)
Hemoglobin: 12.7 g/dL (ref 12.0–15.0)
MCH: 32.3 pg (ref 26.0–34.0)
MCHC: 31 g/dL (ref 30.0–36.0)
MCV: 104.3 fL — ABNORMAL HIGH (ref 80.0–100.0)
NRBC: 0 % (ref 0.0–0.2)
Platelets: 243 10*3/uL (ref 150–400)
RBC: 3.93 MIL/uL (ref 3.87–5.11)
RDW: 13.1 % (ref 11.5–15.5)
WBC: 9.4 10*3/uL (ref 4.0–10.5)

## 2018-08-29 LAB — APTT: aPTT: 24 seconds (ref 24–36)

## 2018-08-29 LAB — COMPREHENSIVE METABOLIC PANEL
ALBUMIN: 3.9 g/dL (ref 3.5–5.0)
ALT: 25 U/L (ref 0–44)
AST: 29 U/L (ref 15–41)
Alkaline Phosphatase: 61 U/L (ref 38–126)
Anion gap: 11 (ref 5–15)
BUN: 21 mg/dL (ref 8–23)
CO2: 25 mmol/L (ref 22–32)
Calcium: 8.9 mg/dL (ref 8.9–10.3)
Chloride: 105 mmol/L (ref 98–111)
Creatinine, Ser: 1.35 mg/dL — ABNORMAL HIGH (ref 0.44–1.00)
GFR calc Af Amer: 43 mL/min — ABNORMAL LOW (ref >60)
GFR calc non Af Amer: 37 mL/min — ABNORMAL LOW (ref >60)
Glucose, Bld: 121 mg/dL — ABNORMAL HIGH (ref 70–99)
POTASSIUM: 4.2 mmol/L (ref 3.5–5.1)
Sodium: 141 mmol/L (ref 135–145)
Total Bilirubin: 0.6 mg/dL (ref 0.3–1.2)
Total Protein: 7.2 g/dL (ref 6.5–8.1)

## 2018-08-29 LAB — DIFFERENTIAL
Abs Immature Granulocytes: 0.03 10*3/uL (ref 0.00–0.07)
Basophils Absolute: 0.1 10*3/uL (ref 0.0–0.1)
Basophils Relative: 1 %
Eosinophils Absolute: 0.3 10*3/uL (ref 0.0–0.5)
Eosinophils Relative: 3 %
Immature Granulocytes: 0 %
Lymphocytes Relative: 33 %
Lymphs Abs: 3.1 10*3/uL (ref 0.7–4.0)
Monocytes Absolute: 0.6 10*3/uL (ref 0.1–1.0)
Monocytes Relative: 7 %
NEUTROS PCT: 56 %
Neutro Abs: 5.4 10*3/uL (ref 1.7–7.7)

## 2018-08-29 LAB — PROTIME-INR
INR: 0.94
Prothrombin Time: 12.5 seconds (ref 11.4–15.2)

## 2018-08-29 NOTE — ED Notes (Signed)
Patient transported to MRI 

## 2018-08-29 NOTE — Discharge Instructions (Signed)
Return for worsening weakness, numbness, concern for being unsafe at home with ambulation, difficulty with urination or moving your bowels.    Please follow up with your neurologist and PCP.

## 2018-08-29 NOTE — ED Triage Notes (Signed)
Pt endorses left side weakness/numbness x 1 week. Also states that she has had difficulty swallowing x 2 weeks. Pt's daughter noticed the pt having difficulty with her left leg at 1000 this morning but pt states that she has had these problems for 1 week.  VSS, axox4. No drift, facial asymetry, grips feel equal, no aphasia or slurred speech.

## 2018-08-29 NOTE — ED Provider Notes (Signed)
[facility] EMERGENCY DEPARTMENT Provider Note   CSN: [id number] Arrival date & time: 08/29/18  1336     History   Chief Complaint Chief Complaint  Patient presents with  . Stroke Sx    HPI Martha Olsen is a 83 y.o. female with a hx of prior stroke w/ residual R sided weakness, DM, dementia, anxiety, depression, HTN, dyslipidemia, seizures, chronic back pain, and CAD who presents to the ED with her daughter for L sided weakness/numbness x 1 week. Patient states she has had issues with intermittent LUE/LLE numbness & weakness, states she notices this more at night, but no specific alleviating/aggravating factors. She had not mentioned this to anyone as she initially did not want to come to the hospital. Today she was with her daughter and was ambulating into the cracker barrel for breakfast with her cane which she uses at baseline when she felt she could not walk secondary to numbness/weakness in the LLE. States she felt like she had to drag her leg which daughter confirms is what it appeared she was trying to do. Family assisted her to a wheel chair & ultimately she has not been able to ambulate normally since. She notes some trouble swallowing which has been going on for awhile and may have been worse over the past 1 week, describes this as "needing to drink some water to wash down my food." Denies acute confusion, slurred speech, facial droop, chest pain, dyspnea, nausea, vomiting, or diarrhea. She does have chronic back pain which is unchanged.   HPI  Past Medical History:  Diagnosis Date  . Anxiety   . Dementia ([place])   . Depression   . Diabetes mellitus 2011  . Headache(784.0)   . History of echocardiogram    Echo 5/16: EF 55-60%, normal wall motion, grade 1 diastolic dysfunction  . Hypertension   . Seizure ([place])   . Stroke [facility]) 2002   right side weakness  . UTI (urinary tract infection) 02/2015    Patient Active Problem List   Diagnosis Date Noted    . Fall 03/01/2015  . Pressure ulcer 03/01/2015  . Blood poisoning   . Dementia ([place])   . Essential hypertension 02/28/2015  . GERD (gastroesophageal reflux disease) 02/28/2015  . UTI (lower urinary tract infection) 02/28/2015  . Sepsis ([place]) 02/28/2015  . Acute encephalopathy 02/28/2015  . Excessive daytime sleepiness 09/03/2014  . Abnormality of gait 08/18/2014  . Urinary urgency 08/18/2014  . Sleep apnea 07/14/2014  . CAD (coronary artery disease) 12/11/2012  . Abnormal ECG 11/14/2012  . Stroke ([place])   . Hypertension   . Anxiety   . Depression   . Seizure ([place])   . Seizure disorder ([place]) 10/12/2012  . Elevated troponin I level 10/12/2012  . Diarrhea 10/12/2012  . Pneumonia 10/12/2012  . Hypokalemia 10/12/2012  . DM (diabetes mellitus) ([place]) 10/12/2012  . HTN (hypertension) 10/12/2012  . Dyslipidemia 10/12/2012  . Rotator cuff tear arthropathy, left 12/08/2011    Past Surgical History:  Procedure Laterality Date  . ABDOMINAL HYSTERECTOMY  1980's  . APPENDECTOMY  1980's  . CHOLECYSTECTOMY  1993  . EYE SURGERY  2009  . FOOT SURGERY    . JOINT REPLACEMENT  2012   Hip, Knee  . REVERSE SHOULDER ARTHROPLASTY  12/07/2011   Procedure: REVERSE SHOULDER ARTHROPLASTY;  Surgeon: Marin Shutter, MD;  Location: [facility];  Service: Orthopedics;  Laterality: Left;  left total reverse shoulder  . TONSILLECTOMY  1951  OB History   No obstetric history on file.      Home Medications    Prior to Admission medications   Medication Sig Start Date End Date Taking? Authorizing Provider  aspirin 81 MG chewable tablet Chew 1 tablet (81 mg total) by mouth daily. 10/14/12   Barton Dubois, MD  atorvastatin (LIPITOR) 40 MG tablet Take 40 mg by mouth daily.    [provider]  busPIRone (BUSPAR) 15 MG tablet Take 15 mg by mouth 3 (three) times daily.    [provider]  carvedilol (COREG) 6.25 MG tablet Take 1 tablet (6.25 mg total) by mouth 2 (two) times daily with a  meal. 09/28/17   Marcial Pacas, MD  glimepiride (AMARYL) 2 MG tablet Take 2 mg by mouth daily before breakfast.    [provider]  levETIRAcetam (KEPPRA) 500 MG tablet 1 tablet every morning, 2 tablets every night 09/28/17   Marcial Pacas, MD  lisinopril-hydrochlorothiazide (PRINZIDE,ZESTORETIC) 20-12.5 MG per tablet Take 1 tablet by mouth daily.    [provider]  LORazepam (ATIVAN) 2 MG tablet Take 2 mg by mouth at bedtime.     [provider]  memantine (NAMENDA) 10 MG tablet TAKE 1 TABLET BY MOUTH 2 TIMES DAILY 06/24/18   Marcial Pacas, MD  metFORMIN (GLUCOPHAGE) 1000 MG tablet Take 1,000 mg by mouth 2 (two) times daily with a meal.  07/01/14   [provider]  nitroGLYCERIN (NITROSTAT) 0.4 MG SL tablet Place 1 tablet (0.4 mg total) under the tongue every 5 (five) minutes as needed for chest pain. 12/11/12   Josue Hector, MD  PROAIR HFA 108 [phone] BASE) MCG/ACT inhaler Inhale 1-2 puffs into the lungs every 6 (six) hours as needed for wheezing or shortness of breath.  10/04/12   [provider]  ranitidine (ZANTAC) 150 MG tablet Take 150 mg by mouth 2 (two) times daily as needed for heartburn.    [provider]  sertraline (ZOLOFT) 50 MG tablet Take 1.5 tablets (75 mg total) by mouth daily. 03/11/18   Marcial Pacas, MD    Family History Family History  Problem Relation Age of Onset  . Stroke Mother 35  . Hypertension Mother   . CAD Father 29  . Heart attack Father   . Anesthesia problems Neg Hx     Social History Social History   Tobacco Use  . Smoking status: Never Smoker  . Smokeless tobacco: Never Used  Substance Use Topics  . Alcohol use: No    Alcohol/week: 0.0 standard drinks  . Drug use: No     Allergies   Codeine   Review of Systems Review of Systems  Constitutional: Negative for chills and fever.  Respiratory: Negative for shortness of breath.   Cardiovascular: Negative for chest pain.  Gastrointestinal: Negative for  abdominal pain, diarrhea, nausea and vomiting.  Genitourinary: Negative for dysuria.  Musculoskeletal: Positive for back pain (chronic unchanged).  Neurological: Positive for weakness and numbness. Negative for dizziness, seizures, syncope, facial asymmetry and speech difficulty.  Psychiatric/Behavioral: Negative for confusion.  All other systems reviewed and are negative.    Physical Exam Updated Vital Signs BP (!) 145/68 (BP Location: Left Wrist)   Pulse 80   Temp 98.2 F (36.8 C) (Oral)   Resp 18   SpO2 97%   Physical Exam Vitals signs and nursing note reviewed.  Constitutional:      General: She is not in acute distress.    Appearance: She is well-developed. She  is not toxic-appearing.  HENT:     Head: Normocephalic and atraumatic.  Eyes:     General:        Right eye: No discharge.        Left eye: No discharge.     Conjunctiva/sclera: Conjunctivae normal.  Neck:     Musculoskeletal: Neck supple.  Cardiovascular:     Rate and Rhythm: Normal rate and regular rhythm.  Pulmonary:     Effort: Pulmonary effort is normal. No respiratory distress.     Breath sounds: Normal breath sounds. No wheezing, rhonchi or rales.  Abdominal:     General: There is no distension.     Palpations: Abdomen is soft.     Tenderness: There is no abdominal tenderness.  Musculoskeletal:     Comments: No obvious deformity, appreciable swelling, erythema, or ecchymosis.  Back: no point/focal vertebral tenderness.   Skin:    General: Skin is warm and dry.     Findings: No rash.  Neurological:     Mental Status: She is alert.     Comments: Clear speech. CN III-XII grossly intact. Normal finger to nose. Negative pronator drift. Sensation grossly intact x 4. 5/5 symmetric grip strength. Decreased strength to LLE muscle groups in comparison to RLE muscle groups. Attempted to ambulate patient with assistance and she was dragging the LLE somewhat.   Psychiatric:        Behavior: Behavior normal.       ED Treatments / Results  Labs (all labs ordered are listed, but only abnormal results are displayed) Labs Reviewed  CBC - Abnormal; Notable for the following components:      Result Value   MCV 104.3 (*)    All other components within normal limits  COMPREHENSIVE METABOLIC PANEL - Abnormal; Notable for the following components:   Glucose, Bld 121 (*)    Creatinine, Ser 1.35 (*)    GFR calc non Af Amer 37 (*)    GFR calc Af Amer 43 (*)    All other components within normal limits  PROTIME-INR  APTT  DIFFERENTIAL    EKG EKG Interpretation  Date/Time:  Thursday August 29 2018 13:58:03 EST Ventricular Rate:  70 PR Interval:  174 QRS Duration: 86 QT Interval:  414 QTC Calculation: 447 R Axis:   52 Text Interpretation:  Normal sinus rhythm with sinus arrhythmia Low voltage QRS Borderline ECG No significant change since last tracing Confirmed by Deno Etienne [phone]) on 08/29/2018 6:04:42 PM   Radiology Dg Chest 2 View  Result Date: 08/29/2018 CLINICAL DATA:  Cp and sob x1 week,along with generalized numbness. EXAM: CHEST - 2 VIEW COMPARISON:  02/28/2015 FINDINGS: The heart size and mediastinal contours are within normal limits. Both lungs are clear. Remote LEFT shoulder arthroplasty. IMPRESSION: No active cardiopulmonary disease. Electronically Signed   By: Nolon Nations M.D.   On: 08/29/2018 20:16   Ct Head Wo Contrast  Result Date: 08/29/2018 CLINICAL DATA:  Left-sided weakness and numbness, possible stroke EXAM: CT HEAD WITHOUT CONTRAST TECHNIQUE: Contiguous axial images were obtained from the base of the skull through the vertex without intravenous contrast. COMPARISON:  02/28/2015 FINDINGS: Brain: No evidence of acute infarction, hemorrhage, hydrocephalus, extra-axial collection or mass lesion/mass effect. Unchanged nonacute infarction and encephalomalacia of the right basal ganglia. Vascular: No hyperdense vessel or unexpected calcification. Skull: Normal. Negative  for fracture or focal lesion. Sinuses/Orbits: No acute finding. Other: None. IMPRESSION: No acute intracranial pathology. No non-contrast CT evidence of acute stroke or hemorrhage. Unchanged  nonacute infarction of the right basal ganglia. Electronically Signed   By: Eddie Candle M.D.   On: 08/29/2018 14:56    Procedures Procedures (including critical care time)  Medications Ordered in ED Medications - No data to display   Initial Impression / Assessment and Plan / ED Course  I have reviewed the triage vital signs and the nursing notes.  Pertinent labs & imaging results that were available during my care of the patient were reviewed by me and considered in my medical decision making (see chart for details).   Patient presents w/ intermittent LUE/LLE numbness/weakness x 1 week with difficulty w/ ambulation today. Nontoxic appearing, in no apparent distress, vitals without significant abnormality. Her neurologic exam is notable for some decreased strength in the LLE and some difficult with ambulation secondary to LLE weakness/numbness per patient report.   Work-up per triage:  CBC: No anemia or leukocytosis.  CMP: creatinine elevated at 1.35, last on record 3 years prior. No significant electrolyte disturbance.  PT/INR/APTT: WNL EKG: NSR CT head wo contrast: No acute intracranial pathology. No non-contrast CT evidence of acute stroke or hemorrhage. Unchanged nonacute infarction of the right basal ganglia  Patient signs/sxs concerning for ischemic CVA. Discussed with supervising physician Dr. Tyrone Nine who has personally evaluated & examined patient- recommendation for neurology consultation which I am in agreement with.   19:25: CONSULT: Discussed case with neurologist Dr. Rory Percy- relays that symptoms could be an exacerbation of prior stroke she has had but could also be acute stroke. Recommends MRI brain wo contrast, urinalysis, & CXR- UA/CXR for possible exacerbating factors of prior stroke. If  MRI negative for acute process- can likely be discharged home w/ follow up with her neurologist Dr. [name]. If positive for acute stroke- in agreement with admission. Requesting call back w/ MRI results.   CXR negative.   Patient care transitioned to Dr. Tyrone Nine at change of shift pending remaining results w/ appropriate disposition.   Final Clinical Impressions(s) / ED Diagnoses   Final diagnoses:  None    ED Discharge Orders    None       Amaryllis Dyke, PA-C 08/29/18 2044    Deno Etienne, DO 08/29/18 2317

## 2018-08-29 NOTE — ED Notes (Signed)
Pt remains in MRI 

## 2018-08-30 NOTE — ED Notes (Signed)
Patient verbalizes understanding of discharge instructions. Opportunity for questioning and answers were provided. Armband removed by staff, pt discharged from ED in wheelchair with family.  

## 2018-09-05 DIAGNOSIS — F411 Generalized anxiety disorder: Secondary | ICD-10-CM | POA: Diagnosis not present

## 2018-09-05 DIAGNOSIS — F0280 Dementia in other diseases classified elsewhere without behavioral disturbance: Secondary | ICD-10-CM | POA: Diagnosis not present

## 2018-09-05 DIAGNOSIS — F5101 Primary insomnia: Secondary | ICD-10-CM | POA: Diagnosis not present

## 2018-09-05 DIAGNOSIS — F332 Major depressive disorder, recurrent severe without psychotic features: Secondary | ICD-10-CM | POA: Diagnosis not present

## 2018-09-16 ENCOUNTER — Ambulatory Visit: Payer: Medicare Other | Admitting: Neurology

## 2018-09-18 NOTE — Progress Notes (Signed)
PATIENT: Martha Olsen DOB: 03/10/1936  REASON FOR VISIT: follow up HISTORY FROM: patient  HISTORY OF PRESENT ILLNESS: Today 09/19/18  HISTORY  HISTORY (YAN): Martha Olsen,seen in refer by her primary care physician Dr. Donavan Burnet for evaluation of memory loss.  She has past medical history of HTN, s/p CVA right basal ganglia 11/2001, dyslipidemia, DM, anxiety/depression, s/p appendectomy, s/p hysterectomy, DJD, s/p left hip replacement 03/2002, left knee replacement 01/2011, foot surgery and reverse left shoulder arthroplasty 11/2011.  She had 9 years of education, she was a Chartered certified accountant, she worked at [employer], [employer], retried at age [employer] care of her grandchildren. She denies family history of memory loss. Mother died at age 94 from stroke.  I saw her initially following her hospital discharge in March 2013,  In October 13 2011, she was at home, sitting at the rocking chair, was noted by her family fell over the chair, had generalized tonic-clonic seizure, patient has no warning signs, no collection of the event, EMS was called, she was taken by ambulance to the hospital, she could not remember waking up in the hospital confused, she was put on Keppra 500 mg twice a day, tolerating it well, there was no recurrent seizure.  EEG was normal. MRI of the brainin 2013has demonstrated old basal ganglion, and coronal radiata stroke, no acute lesions,  She complains of headache everyday, right temporal regions, light noise is bothersome,   She has quit driving since 3419, husband retired, she is active at home, house work  She has no recurrent seizures, tolerating Keppra 500 mg twice a day without significant side effect, she has increased gait difficulty because of joints, knee pain, She has been complaining of a year history of worsening headaches, bilateral frontal, vortex region, she has to take ibuprofen 200 mg 2-3 tablets each time, sometimes two dosage  each day  She was given prescription of tramadol 50 mg as neededinDecember 2015, her headache has much improved, taking tramadol about twice each week, she continue has bilateral knee pain, low back pain, gait difficulty, she also has nocturia, frequent awakening, loss snoring, excessive daytime sleepiness, fatigue, sleep studies pending in August 25 2014  ESR, TSH was normal December 2015, mild elevated C-reactive protein 11.  UPDATE February 18 2015: She is accompanied by her husband, and daughter at today's clinical visit,  She had a urgent visit with Dr. Jaynee Eagles in February 09 2015 because of increased confusion, was found to have UTI,she was given prescription of Augmentin for 5 days, there was no significant change in her confusion  February 09 2015, EEG recording asymmetric slowing of the left hemisphere, with occasionally sharp transient  Patient's continue have slow worsening confusion, agitation, paranoid ideation, difficulty sleeping at nighttime, She had 10 years of education, worked two jobs most of her life, she retired at age 41, was very active until she had stroke in 2013, was noted to have declining functional status, increased memory trouble, especially since January 2016,  She also has worsening gait difficulty,worsening right knee pain, had a recent evaluation by orthopedic surgeon,she is not a candidate for right knee replacement anymore,because of worsening memory trouble, gradually increased confusion  She also complains of low back pain,worsening bowel and bladder incontinence Update May 11 2015: Her confusion continues with getting worse, she is more agitated, could not well at night time, paranoid, scared, seroquel has been helpful, she is now taking 25 mg 1 in the morning, 1 at 2:00, 2 tablets  at night  She is on polypharmacy treatment including Paxil, BuSpar, Ativan as needed, She has no recurrent seizure, taking Keppra 500 mg/1000 mg  UPDATE Aug 11 2015: She is now taking seroquel '25mg'$  3 tabs qhs,ativan prn, but she still has trouble sleeping. She is seeing psychiatrist, taking BuSpar, she has been dealing with depression for a long time. She was previously treated as SSRI, which has been helpful  She has no recurrent seizure, taking Keppra 500/1000 mg daily  UPDATE 09/23/2015:Martha Olsen is a 83 year old female with a history of memory disorder and insomnia. She returns today for an evaluation. The patient did follow up with a psychiatrist. She was taken off of Seroquel. The patient states that she continues to have depression-- she has remained on Zoloft 50 mg daily. The patient has a hard time sleeping at night. However the Seroquel did not give her much benefit either. She was on Aricept for her memory but this caused itching and therefore the medication was discontinued by her psychiatrist. The patient is able to complete most ADLs independently. She lives at home with her husband. The patient is unable to get out of the house much because she does not drive and her husband is not able. She does feel that this may contribute to her depression. She denies any new neurological symptoms. She returns today for an evaluation.  UPDATE Jan 10th 2018: She is with her husband at today's clinical visit, she has been doing very well over the past 6 months, tolerating current medications, does not want to make any changes, no recurrent seizure, on Keppra 500/1000  UPDATE01/10/19 Martha Olsen a 83 year old female with a history of memory disturbance and seizures. She returns today for follow-up. She continues on Namenda for her memory. She is now living with her daughter and. Her husband passed away last 09/23/22. She is able to complete all ADLs independently. She does need assistance getting in and out of the shower. Her daughter does all of her finances. She reports that she has nothad any additional seizure events. She remains on Keppra.  Overall she feels that she is doing well. She returns today for evaluation.   01/28/18 MM : Martha Olsen is a 83 year old female with a history of memory disturbance and seizures.  He remains on Keppra .  She denies any seizure events.  She continues to live with her daughter Lelon Frohlich. she feels that her memory has remained stable.  She continues on Namenda.  She is able to complete all ADLs with minimal assistance.  She occasionally needs help dressing..  Her daughter will sometimes assist her with getting in and out of the shower.  However the patient can bathe herself.  The patient denies any trouble sleeping.  Denies hallucinations or agitation.  Reports good appetite.  She returns today for an evaluation.  Update October 04, 2018 SS: She was in the emergency department on August 29, 2022 left-sided weakness/numbness over 1 week. On February 13 she was at [facility] and had difficulty walking, transported to the emergency department.  She was noted to have mild left-sided weakness she had an MRI of the brain that was negative for acute pathology.  She was not admitted.  She has history of a prior stroke with residual right-sided weakness.  In July 2019 her MMSE was 28/30.  She is currently taking Aricept 10 mg at bedtime, Keppra 500 mg 1 tablet morning, 2 tablets at night, Namenda 10 mg 2 times daily.   She  is at today's appointment with her daughter and granddaughter.  She reports continued problems using her left leg and that when she walks long distances she reports she will drag her leg.  She denies any numbness or tingling.  She reports this is not constant and is intermittent.  She always uses a walker, she denies any falls.  She thinks her left leg is weaker than normal.  She denies any changes in the bowels or bladder but does report she sometimes will have accidents.  She denies any episodes of seizures.  She continues to take Keppra and is tolerating medication well.  She lives with her  daughter and requires help with bathing and dressing.  She does not do any cooking but she reports a good appetite.  She enjoys watching TV, doing puzzles, playing games.  She is not very active.  She reports problems with her short-term memory.  There have been no episodes of wandering or getting lost.  She does not drive. she reports some back pain and was told she had arthritis in her back.  She has done physical therapy in the past however it was not beneficial.  REVIEW OF SYSTEMS: Out of a complete 14 system review of symptoms, the patient complains only of the following symptoms, and all other reviewed systems are negative.    ALLERGIES: Allergies  Allergen Reactions  . Codeine Hives    HOME MEDICATIONS: Outpatient Medications Prior to Visit  Medication Sig Dispense Refill  . aspirin 81 MG chewable tablet Chew 1 tablet (81 mg total) by mouth daily. 30 tablet 2  . atorvastatin (LIPITOR) 40 MG tablet Take 40 mg by mouth daily.    . busPIRone (BUSPAR) 15 MG tablet Take 15 mg by mouth 3 (three) times daily.    . carvedilol (COREG) 6.25 MG tablet Take 1 tablet (6.25 mg total) by mouth 2 (two) times daily with a meal. 180 tablet 3  . gabapentin (NEURONTIN) 300 MG capsule Take 600 mg by mouth 2 (two) times daily.    Marland Kitchen glimepiride (AMARYL) 2 MG tablet Take 2 mg by mouth daily before breakfast.    . lisinopril-hydrochlorothiazide (PRINZIDE,ZESTORETIC) 20-12.5 MG per tablet Take 1 tablet by mouth daily.    Marland Kitchen LORazepam (ATIVAN) 2 MG tablet Take 2 mg by mouth at bedtime.     . metFORMIN (GLUCOPHAGE) 1000 MG tablet Take 1,000 mg by mouth daily with breakfast.   1  . mirtazapine (REMERON) 15 MG tablet Take 15 mg by mouth at bedtime.    . nitroGLYCERIN (NITROSTAT) 0.4 MG SL tablet Place 1 tablet (0.4 mg total) under the tongue every 5 (five) minutes as needed for chest pain. 25 tablet 3  . ranitidine (ZANTAC) 150 MG tablet Take 150 mg by mouth 2 (two) times daily as needed for heartburn.    .  sertraline (ZOLOFT) 50 MG tablet Take 1.5 tablets (75 mg total) by mouth daily. 135 tablet 1  . trimethoprim (TRIMPEX) 100 MG tablet Take 100 mg by mouth daily.    Marland Kitchen donepezil (ARICEPT) 10 MG tablet Take 10 mg by mouth at bedtime.    . levETIRAcetam (KEPPRA) 500 MG tablet 1 tablet every morning, 2 tablets every night (Patient taking differently: Take 500-1,000 mg by mouth See admin instructions. Take 500 mg by mouth in the morning and 1,000 mg at bedtime) 270 tablet 3  . memantine (NAMENDA) 10 MG tablet TAKE 1 TABLET BY MOUTH 2 TIMES DAILY (Patient taking differently: Take 10 mg by mouth  2 (two) times daily. ) 180 tablet 3   No facility-administered medications prior to visit.     PAST MEDICAL HISTORY: Past Medical History:  Diagnosis Date  . Anxiety   . Dementia ([place])   . Depression   . Diabetes mellitus 2011  . Headache(784.0)   . History of echocardiogram    Echo 5/16: EF 55-60%, normal wall motion, grade 1 diastolic dysfunction  . Hypertension   . Seizure ([place])   . Stroke [facility]) 2002   right side weakness  . UTI (urinary tract infection) 02/2015    PAST SURGICAL HISTORY: Past Surgical History:  Procedure Laterality Date  . ABDOMINAL HYSTERECTOMY  1980's  . APPENDECTOMY  1980's  . CHOLECYSTECTOMY  1993  . EYE SURGERY  2009  . FOOT SURGERY    . JOINT REPLACEMENT  2012   Hip, Knee  . REVERSE SHOULDER ARTHROPLASTY  12/07/2011   Procedure: REVERSE SHOULDER ARTHROPLASTY;  Surgeon: Marin Shutter, MD;  Location: [facility];  Service: Orthopedics;  Laterality: Left;  left total reverse shoulder  . TONSILLECTOMY  1951    FAMILY HISTORY: Family History  Problem Relation Age of Onset  . Stroke Mother 13  . Hypertension Mother   . CAD Father 102  . Heart attack Father   . Anesthesia problems Neg Hx     SOCIAL HISTORY: Social History   Socioeconomic History  . Marital status: Married    Spouse name: Denyse Amass  . Number of children: 4  . Years of education: 9th  . Highest  education level: Not on file  Occupational History  . 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 Financial resource strain: Not on file  . Food insecurity:    Worry: Not on file    Inability: Not on file  . Transportation needs:    Medical: Not on file    Non-medical: Not on file  Tobacco Use  . Smoking status: Never Smoker  . Smokeless tobacco: Never Used  Substance and Sexual Activity  . Alcohol use: No    Alcohol/week: 0.0 standard drinks  . Drug use: No  . Sexual activity: Not on file  Lifestyle  . Physical activity:    Days per week: Not on file    Minutes per session: Not on file  . Stress: Not on file  Relationships  . Social connections:    Talks on phone: Not on file    Gets together: Not on file    Attends religious service: Not on file    Active member of club or organization: Not on file    Attends meetings of clubs or organizations: Not on file    Relationship status: Not on file  . Intimate partner violence:    Fear of current or ex partner: Not on file    Emotionally abused: Not on file    Physically abused: Not on file    Forced sexual activity: Not on file  Other Topics Concern  . Not on file  Social History Narrative   Son recently died of cancer.  Lives at home with husband Thomes Cake) .  She has 3 children .    Education 10 th grade.   Right handed.   Caffeine use: 1 cup coffee/ day            PHYSICAL EXAM  Vitals:   09/19/18 1249  BP: (!) 160/67  Pulse: 79  Weight: 202 lb 9.6 oz (91.9 kg)  Height: 5'  4" (1.626 m)   Body mass index is 34.78 kg/m.  Generalized: Well developed, in no acute distress  MMSE - Mini Mental State Exam 09/19/2018 01/28/2018 07/26/2017  Not completed: (No Data) - -  Orientation to time '5 3 4  '$ Orientation to Place '2 5 4  '$ Registration '3 3 3  '$ Attention/ Calculation 0 5 2  Recall '3 3 2  '$ Language- name 2 objects '2 2 1  '$ Language- repeat 0 1 1  Language- follow 3 step command '3 3 3  '$ Language-  read & follow direction '1 1 1  '$ Write a sentence '1 1 1  '$ Copy design 0 1 0  Total score '20 28 22   '$ Neurological examination  Mentation: Alert oriented to time, place, history taking. Follows all commands speech and language fluent Cranial nerve II-XII: Pupils were equal round reactive to light. Extraocular movements were full, visual field were full on confrontational test. Facial sensation and strength were normal. Uvula tongue midline. Head turning and shoulder shrug  were normal and symmetric. Motor: The motor testing reveals 5 over 5 strength of all 4 extremities, with the exception of weakness to left leg 4/5.  Sensory: Sensory testing is intact to soft touch on all 4 extremities. No evidence of extinction is noted.  Coordination: Cerebellar testing reveals good finger-nose-finger, but difficulty performing heel-to-shin bilaterally Gait and station: Gait is moderately unsteady, using walker, unable to step up to the exam table. Reflexes: Deep tendon reflexes are symmetric and normal bilaterally.   DIAGNOSTIC DATA (LABS, IMAGING, TESTING) - I reviewed patient records, labs, notes, testing and imaging myself where available.  Lab Results  Component Value Date   WBC 9.4 08/29/2018   HGB 12.7 08/29/2018   HCT 41.0 08/29/2018   MCV 104.3 (H) 08/29/2018   PLT 243 08/29/2018      Component Value Date/Time   NA 141 08/29/2018 1533   NA 142 02/09/2015 1021   K 4.2 08/29/2018 1533   CL 105 08/29/2018 1533   CO2 25 08/29/2018 1533   GLUCOSE 121 (H) 08/29/2018 1533   BUN 21 08/29/2018 1533   BUN 17 02/09/2015 1021   CREATININE 1.35 (H) 08/29/2018 1533   CALCIUM 8.9 08/29/2018 1533   PROT 7.2 08/29/2018 1533   PROT 6.4 02/09/2015 1021   ALBUMIN 3.9 08/29/2018 1533   ALBUMIN 4.3 02/09/2015 1021   AST 29 08/29/2018 1533   ALT 25 08/29/2018 1533   ALKPHOS 61 08/29/2018 1533   BILITOT 0.6 08/29/2018 1533   BILITOT 0.9 02/09/2015 1021   GFRNONAA 37 (L) 08/29/2018 1533   GFRAA 43 (L)  08/29/2018 1533   Lab Results  Component Value Date   CHOL 117 10/14/2012   HDL 42 10/14/2012   LDLCALC 40 10/14/2012   TRIG 175 (H) 10/14/2012   CHOLHDL 2.8 10/14/2012   Lab Results  Component Value Date   HGBA1C 7.0 (H) 10/12/2012   No results found for: [id number] Lab Results  Component Value Date   TSH 2.030 07/14/2014      ASSESSMENT AND PLAN 83 y.o. year old female  has a past medical history of Anxiety, Dementia ([place]), Depression, Diabetes mellitus (2011), Headache(784.0), History of echocardiogram, Hypertension, Seizure ([place]), Stroke ([place]) (2002), and UTI (urinary tract infection) (02/2015). here with:  1.  Seizures 2.  Memory disturbance 3.  Gait abnormality  She will continue taking Keppra and a refill was sent to her pharmacy.  She has not had any seizure episodes.  Her memory score has declined  to 20/30.  She is currently taking Aricept and Namenda.  She had MRI of the brain in February 2020 that did not show any acute abnormality.  She continues to have difficulty with her left leg mainly ambulating and some mild weakness.  We discussed possibly doing further imaging to include a MRI of her lumbar spine.  She does report chronic back pain and that a x-ray was done of her back that showed she had arthritis.  I did offer physical therapy however she reports this was not beneficial.  We could potentially get an MRI of her lumbar spine if the problem with her left leg continues.  I discussed this with her daughter and is agreeable to the plan.  I advised him that if her symptoms worsen or if she develops any new symptoms she should let us know.  She will follow-up in 6 months or sooner if needed.   Butler Denmark, AGNP-C, DNP 09/19/2018, 3:33 PM [facility] [address], [place], [place] [address] [phone]

## 2018-09-19 ENCOUNTER — Ambulatory Visit: Payer: Medicare Other | Admitting: Neurology

## 2018-09-19 ENCOUNTER — Encounter: Payer: Self-pay | Admitting: Neurology

## 2018-09-19 VITALS — BP 160/67 | HR 79 | Ht 64.0 in | Wt 202.6 lb

## 2018-09-19 DIAGNOSIS — F0391 Unspecified dementia with behavioral disturbance: Secondary | ICD-10-CM | POA: Diagnosis not present

## 2018-09-19 DIAGNOSIS — E1142 Type 2 diabetes mellitus with diabetic polyneuropathy: Secondary | ICD-10-CM | POA: Diagnosis not present

## 2018-09-19 DIAGNOSIS — G40909 Epilepsy, unspecified, not intractable, without status epilepticus: Secondary | ICD-10-CM

## 2018-09-19 DIAGNOSIS — Z7984 Long term (current) use of oral hypoglycemic drugs: Secondary | ICD-10-CM | POA: Diagnosis not present

## 2018-09-19 DIAGNOSIS — R531 Weakness: Secondary | ICD-10-CM | POA: Diagnosis not present

## 2018-09-19 DIAGNOSIS — M150 Primary generalized (osteo)arthritis: Secondary | ICD-10-CM | POA: Diagnosis not present

## 2018-09-19 MED ORDER — DONEPEZIL HCL 10 MG PO TABS: 10.0000 mg | ORAL_TABLET | Freq: Every day | ORAL | 1 refills | Status: DC

## 2018-09-19 MED ORDER — LEVETIRACETAM 500 MG PO TABS: ORAL_TABLET | ORAL | 3 refills | Status: DC

## 2018-09-19 MED ORDER — MEMANTINE HCL 10 MG PO TABS: 10.0000 mg | ORAL_TABLET | Freq: Two times a day (BID) | ORAL | 3 refills | Status: DC

## 2018-09-19 NOTE — Addendum Note (Signed)
Addended by: Suzzanne Cloud on: 09/19/2018 03:35 PM   Modules accepted: Level of Service

## 2018-09-25 NOTE — Progress Notes (Signed)
I have reviewed and agreed above plan. 

## 2018-10-09 ENCOUNTER — Other Ambulatory Visit: Payer: Self-pay | Admitting: Neurology

## 2018-10-09 MED ORDER — SERTRALINE HCL 50 MG PO TABS: 75.0000 mg | ORAL_TABLET | Freq: Every day | ORAL | 1 refills | Status: DC

## 2018-10-09 NOTE — Telephone Encounter (Signed)
Pt daughter has called for a refill on sertraline (ZOLOFT) 50 MG tablet  [facility] - [place] [place] - [place]

## 2018-10-09 NOTE — Telephone Encounter (Signed)
Ok to send in refill. Dr. [name] appears to be filling this for her. Zoloft 50 mg tablets, take 1.5 (75 mg) by mouth daily. I'll send the refill to [facility].

## 2018-10-09 NOTE — Addendum Note (Signed)
Addended by: Suzzanne Cloud on: 10/09/2018 01:52 PM   Modules accepted: Orders

## 2018-10-18 DIAGNOSIS — Z0000 Encounter for general adult medical examination without abnormal findings: Secondary | ICD-10-CM | POA: Diagnosis not present

## 2018-10-18 DIAGNOSIS — E1142 Type 2 diabetes mellitus with diabetic polyneuropathy: Secondary | ICD-10-CM | POA: Diagnosis not present

## 2018-10-18 DIAGNOSIS — I10 Essential (primary) hypertension: Secondary | ICD-10-CM | POA: Diagnosis not present

## 2018-10-18 DIAGNOSIS — E7800 Pure hypercholesterolemia, unspecified: Secondary | ICD-10-CM | POA: Diagnosis not present

## 2018-11-12 ENCOUNTER — Telehealth: Payer: Self-pay | Admitting: Neurology

## 2018-11-12 MED ORDER — CARVEDILOL 6.25 MG PO TABS: 6.2500 mg | ORAL_TABLET | Freq: Two times a day (BID) | ORAL | 3 refills | Status: AC

## 2018-11-12 NOTE — Telephone Encounter (Signed)
Medication has been sent to [facility]

## 2018-11-12 NOTE — Telephone Encounter (Signed)
Pts daughter called in stating that her mother is needing a refill on her carvedilol (COREG) 6.25 MG tablet sent to [facility] on [address]

## 2018-11-21 DIAGNOSIS — F411 Generalized anxiety disorder: Secondary | ICD-10-CM | POA: Diagnosis not present

## 2018-11-21 DIAGNOSIS — F0280 Dementia in other diseases classified elsewhere without behavioral disturbance: Secondary | ICD-10-CM | POA: Diagnosis not present

## 2018-11-21 DIAGNOSIS — F331 Major depressive disorder, recurrent, moderate: Secondary | ICD-10-CM | POA: Diagnosis not present

## 2018-11-21 DIAGNOSIS — F5101 Primary insomnia: Secondary | ICD-10-CM | POA: Diagnosis not present

## 2018-12-19 DIAGNOSIS — F411 Generalized anxiety disorder: Secondary | ICD-10-CM | POA: Diagnosis not present

## 2018-12-19 DIAGNOSIS — F5101 Primary insomnia: Secondary | ICD-10-CM | POA: Diagnosis not present

## 2018-12-19 DIAGNOSIS — F0280 Dementia in other diseases classified elsewhere without behavioral disturbance: Secondary | ICD-10-CM | POA: Diagnosis not present

## 2018-12-19 DIAGNOSIS — F331 Major depressive disorder, recurrent, moderate: Secondary | ICD-10-CM | POA: Diagnosis not present

## 2019-01-22 DIAGNOSIS — F0280 Dementia in other diseases classified elsewhere without behavioral disturbance: Secondary | ICD-10-CM | POA: Diagnosis not present

## 2019-01-22 DIAGNOSIS — F411 Generalized anxiety disorder: Secondary | ICD-10-CM | POA: Diagnosis not present

## 2019-01-22 DIAGNOSIS — F5101 Primary insomnia: Secondary | ICD-10-CM | POA: Diagnosis not present

## 2019-01-22 DIAGNOSIS — F331 Major depressive disorder, recurrent, moderate: Secondary | ICD-10-CM | POA: Diagnosis not present

## 2019-03-24 NOTE — Progress Notes (Signed)
PATIENT: Martha Olsen DOB: February 10, 1936  REASON FOR VISIT: follow up HISTORY FROM: patient  HISTORY OF PRESENT ILLNESS: Today 03/25/19  HISTORY  HISTORY (YAN): Martha Olsen,seen in refer by her primary care physician Dr. Donavan Burnet for evaluation of memory loss.  She has past medical history of HTN, s/p CVA right basal ganglia 11/2001, dyslipidemia, DM, anxiety/depression, s/p appendectomy, s/p hysterectomy, DJD, s/p left hip replacement 03/2002, left knee replacement 01/2011, foot surgery and reverse left shoulder arthroplasty 11/2011.  She had 9 years of education, she was a Chartered certified accountant, she worked at [employer], [employer], retried at age [place] care of her grandchildren. She denies family history of memory loss. Mother died at age 45 from stroke.  I saw her initially following her hospital discharge in March 2013,  In October 13 2011, she was at home, sitting at the rocking chair, was noted by her family fell over the chair, had generalized tonic-clonic seizure, patient has no warning signs, no collection of the event, EMS was called, she was taken by ambulance to the hospital, she could not remember waking up in the hospital confused, she was put on Keppra 500 mg twice a day, tolerating it well, there was no recurrent seizure.  EEG was normal. MRI of the brainin 2013has demonstrated old basal ganglion, and coronal radiata stroke, no acute lesions,  She complains of headache everyday, right temporal regions, light noise is bothersome,   She has quit driving since 5089, husband retired, she is active at home, house work  She has no recurrent seizures, tolerating Keppra 500 mg twice a day without significant side effect, she has increased gait difficulty because of joints, knee pain, She has been complaining of a year history of worsening headaches, bilateral frontal, vortex region, she has to take ibuprofen 200 mg 2-3 tablets each time, sometimes two dosage  each day  She was given prescription of tramadol 50 mg as neededinDecember 2015, her headache has much improved, taking tramadol about twice each week, she continue has bilateral knee pain, low back pain, gait difficulty, she also has nocturia, frequent awakening, loss snoring, excessive daytime sleepiness, fatigue, sleep studies pending in August 25 2014  ESR, TSH was normal December 2015, mild elevated C-reactive protein 11.  UPDATE February 18 2015: She is accompanied by her husband, and daughter at today's clinical visit,  She had a urgent visit with Dr. Jaynee Eagles in February 09 2015 because of increased confusion, was found to have UTI,she was given prescription of Augmentin for 5 days, there was no significant change in her confusion  February 09 2015, EEG recording asymmetric slowing of the left hemisphere, with occasionally sharp transient  Patient's continue have slow worsening confusion, agitation, paranoid ideation, difficulty sleeping at nighttime, She had 10 years of education, worked two jobs most of her life, she retired at age 82, was very active until she had stroke in 2013, was noted to have declining functional status, increased memory trouble, especially since January 2016,  She also has worsening gait difficulty,worsening right knee pain, had a recent evaluation by orthopedic surgeon,she is not a candidate for right knee replacement anymore,because of worsening memory trouble, gradually increased confusion  She also complains of low back pain,worsening bowel and bladder incontinence Update May 11 2015: Her confusion continues with getting worse, she is more agitated, could not well at night time, paranoid, scared, seroquel has been helpful, she is now taking 25 mg 1 in the morning, 1 at 2:00, 2 tablets  at night  She is on polypharmacy treatment including Paxil, BuSpar, Ativan as needed, She has no recurrent seizure, taking Keppra 500 mg/1000 mg  UPDATE Aug 11 2015:  She is now taking seroquel 43m 3 tabs qhs,ativan prn, but she still has trouble sleeping. She is seeing psychiatrist, taking BuSpar, she has been dealing with depression for a long time. She was previously treated as SSRI, which has been helpful  She has no recurrent seizure, taking Keppra 500/1000 mg daily  UPDATE 09/21/15:[name] a 83year old female with a history of memory disorder and insomnia. She returns today for an evaluation. The patient did follow up with a psychiatrist. She was taken off of Seroquel. The patient states that she continues to have depression-- she has remained on Zoloft 50 mg daily. The patient has a hard time sleeping at night. However the Seroquel did not give her much benefit either. She was on Aricept for her memory but this caused itching and therefore the medication was discontinued by her psychiatrist. The patient is able to complete most ADLs independently. She lives at home with her husband. The patient is unable to get out of the house much because she does not drive and her husband is not able. She does feel that this may contribute to her depression. She denies any new neurological symptoms. She returns today for an evaluation.  UPDATE Jan 10th 2018: She is with her husband at today's clinical visit, she has been doing very well over the past 6 months, tolerating current medications, does not want to make any changes, no recurrent seizure, on Keppra 500/1000  UPDATE01/10/19 Ms. Albertyis a 83year old female with a history of memory disturbance and seizures. She returns today for follow-up. She continues on Namenda for her memory. She is now living with her daughter and. Her husband passed away last MMar 28, 2024 She is able to complete all ADLs independently. She does need assistance getting in and out of the shower. Her daughter does all of her finances. She reports that she has nothad any additional seizure events. She remains on Keppra. Overall  she feels that she is doing well. She returns today for evaluation.  01/28/18 MM: [name] a 83year old female with a history of memory disturbance and seizures.Heremains on Keppra .She denies any seizure events. She continues to live with her daughter [name].she feels that her memory has remained stable. She continues on Namenda. She is able to complete all ADLs with minimal assistance. She occasionally needs help dressing.. Her daughter will sometimes assist her with getting in and out of the shower. However the patient can bathe herself. The patient denies any trouble sleeping. Denies hallucinations or agitation. Reports good appetite. She returns today for an evaluation.  Update October 04, 2018 SS: She was in the emergency department on August 29, 2022 left-sided weakness/numbness over 1 week. [facility] to [facility] department.  She was noted to have mild left-sided weakness she had an MRI of the brain that was negative for acute pathology.  She was not admitted.  She has history of a prior stroke with residual right-sided weakness.  In July 2019 her MMSE was 28/30.  She is currently taking Aricept 10 mg at bedtime, Keppra 500 mg 1 tablet morning, 2 tablets at night, Namenda 10 mg 2 times daily.   She is at today's appointment with her daughter and granddaughter.  She reports continued problems using her left leg  and that when she walks long distances she reports she will drag her leg.  She denies any numbness or tingling.  She reports this is not constant and is intermittent.  She always uses a walker, she denies any falls.  She thinks her left leg is weaker than normal.  She denies any changes in the bowels or bladder but does report she sometimes will have accidents.  She denies any episodes of seizures.  She continues to take Keppra and is tolerating medication well.  She lives with her daughter and  requires help with bathing and dressing.  She does not do any cooking but she reports a good appetite.  She enjoys watching TV, doing puzzles, playing games.  She is not very active.  She reports problems with her short-term memory.  There have been no episodes of wandering or getting lost.  She does not drive. she reports some back pain and was told she had arthritis in her back.  She has done physical therapy in the past however it was not beneficial.  Update March 25, 2019 SS: Here with her daughter, reports her memory is stable.  Her memory score today is 27/30.  She lives with her daughter, needs assistance with showering and dressing.  She has a good appetite.  Her daughter manages her medications.  She is using a home exercise bike.  She has not had recurrent seizure.  She uses a walker to ambulate.  She will use a wheelchair if she goes out.  She has not had any recent falls.  She feels that the weakness/numbness in her left leg has improved, she says she does have chronic left sided weakness from her previous stroke.  She does take a daily aspirin.  She denies any new problems or concerns. She is tolerating meds without problem.  REVIEW OF SYSTEMS: Out of a complete 14 system review of symptoms, the patient complains only of the following symptoms, and all other reviewed systems are negative.  Memory loss, seizures   ALLERGIES: Allergies  Allergen Reactions  . Codeine Hives    HOME MEDICATIONS: Outpatient Medications Prior to Visit  Medication Sig Dispense Refill  . ARIPiprazole (ABILIFY) 5 MG tablet     . aspirin 81 MG chewable tablet Chew 1 tablet (81 mg total) by mouth daily. 30 tablet 2  . atorvastatin (LIPITOR) 40 MG tablet Take 40 mg by mouth daily.    . busPIRone (BUSPAR) 15 MG tablet Take 15 mg by mouth 3 (three) times daily.    . carvedilol (COREG) 6.25 MG tablet Take 1 tablet (6.25 mg total) by mouth 2 (two) times daily with a meal. 180 tablet 3  . gabapentin (NEURONTIN)  300 MG capsule Take 600 mg by mouth 2 (two) times daily.    Marland Kitchen glimepiride (AMARYL) 2 MG tablet Take 2 mg by mouth daily before breakfast.    . lisinopril-hydrochlorothiazide (PRINZIDE,ZESTORETIC) 20-12.5 MG per tablet Take 1 tablet by mouth daily.    Marland Kitchen LORazepam (ATIVAN) 2 MG tablet Take 2 mg by mouth at bedtime.     . memantine (NAMENDA) 10 MG tablet Take 1 tablet (10 mg total) by mouth 2 (two) times daily. 180 tablet 3  . metFORMIN (GLUCOPHAGE) 1000 MG tablet Take 1,000 mg by mouth daily with breakfast.   1  . mirtazapine (REMERON) 15 MG tablet Take 15 mg by mouth at bedtime.    . nitroGLYCERIN (NITROSTAT) 0.4 MG SL tablet Place 1 tablet (0.4 mg total) under the tongue  every 5 (five) minutes as needed for chest pain. 25 tablet 3  . ranitidine (ZANTAC) 150 MG tablet Take 150 mg by mouth 2 (two) times daily as needed for heartburn.    . repaglinide (PRANDIN) 0.5 MG tablet     . sertraline (ZOLOFT) 50 MG tablet Take 1.5 tablets (75 mg total) by mouth daily. 135 tablet 1  . trimethoprim (TRIMPEX) 100 MG tablet Take 100 mg by mouth daily.    Marland Kitchen VIIBRYD 40 MG TABS     . zaleplon (SONATA) 10 MG capsule     . donepezil (ARICEPT) 10 MG tablet Take 1 tablet (10 mg total) by mouth at bedtime. 90 tablet 1  . levETIRAcetam (KEPPRA) 500 MG tablet 1 tablet every morning, 2 tablets every night 270 tablet 3   No facility-administered medications prior to visit.     PAST MEDICAL HISTORY: Past Medical History:  Diagnosis Date  . Anxiety   . Dementia ([place])   . Depression   . Diabetes mellitus 2011  . Headache(784.0)   . History of echocardiogram    Echo 5/16: EF 55-60%, normal wall motion, grade 1 diastolic dysfunction  . Hypertension   . Seizure ([place])   . Stroke [facility]) 2002   right side weakness  . UTI (urinary tract infection) 02/2015    PAST SURGICAL HISTORY: Past Surgical History:  Procedure Laterality Date  . ABDOMINAL HYSTERECTOMY  1980's  . APPENDECTOMY  1980's  . CHOLECYSTECTOMY  1993   . EYE SURGERY  2009  . FOOT SURGERY    . JOINT REPLACEMENT  2012   Hip, Knee  . REVERSE SHOULDER ARTHROPLASTY  12/07/2011   Procedure: REVERSE SHOULDER ARTHROPLASTY;  Surgeon: Marin Shutter, MD;  Location: [facility];  Service: Orthopedics;  Laterality: Left;  left total reverse shoulder  . TONSILLECTOMY  1951    FAMILY HISTORY: Family History  Problem Relation Age of Onset  . Stroke Mother 54  . Hypertension Mother   . CAD Father 58  . Heart attack Father   . Anesthesia problems Neg Hx     SOCIAL HISTORY: Social History   Socioeconomic History  . Marital status: Married    Spouse name: Denyse Amass  . Number of children: 4  . Years of education: 9th  . Highest education level: Not on file  Occupational History  . 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 Financial resource strain: Not on file  . Food insecurity    Worry: Not on file    Inability: Not on file  . Transportation needs    Medical: Not on file    Non-medical: Not on file  Tobacco Use  . Smoking status: Never Smoker  . Smokeless tobacco: Never Used  Substance and Sexual Activity  . Alcohol use: No    Alcohol/week: 0.0 standard drinks  . Drug use: No  . Sexual activity: Not on file  Lifestyle  . Physical activity    Days per week: Not on file    Minutes per session: Not on file  . Stress: Not on file  Relationships  . Social Herbalist on phone: Not on file    Gets together: Not on file    Attends religious service: Not on file    Active member of club or organization: Not on file    Attends meetings of clubs or organizations: Not on file    Relationship status: Not on file  . Intimate partner violence  Fear of current or ex partner: Not on file    Emotionally abused: Not on file    Physically abused: Not on file    Forced sexual activity: Not on file  Other Topics Concern  . Not on file  Social History Narrative   Son recently died of cancer.  Lives at home with  husband Thomes Cake) .  She has 3 children .    Education 10 th grade.   Right handed.   Caffeine use: 1 cup coffee/ day          PHYSICAL EXAM  Vitals:   03/25/19 0839  BP: (!) 146/72  Pulse: 74  Temp: 97.7 F (36.5 C)  Weight: 200 lb 6.4 oz (90.9 kg)  Height: _0  (1.6 m)   Body mass index is 35.5 kg/m.  Generalized: Well developed, in no acute distress  MMSE - Mini Mental State Exam 03/25/2019 09/19/2018 01/28/2018  Not completed: - (No Data) -  Orientation to time _1 Orientation to Place _2 Registration _3 Attention/ Calculation 5 0 5  Recall _4 Language- name 2 objects _5 Language- repeat 1 0 1  Language- follow 3 step command _6 Language- read & follow direction _7 Write a sentence _8 Copy design 0 0 1  Copy design-comments named 5 animals - -  Total score _9 Neurological examination  Mentation: Alert oriented to time, place, history taking. Follows all commands speech and language fluent Cranial nerve II-XII: Pupils were equal round reactive to light. Extraocular movements were full, visual field were full on confrontational test. Facial sensation and strength were normal.  Head turning and shoulder shrug  were normal and symmetric. Motor: The motor testing reveals 5 over 5 strength of all 4 extremities. Good symmetric motor tone is noted throughout.  Sensory: Sensory testing is intact to soft touch on all 4 extremities. No evidence of extinction is noted.  Coordination: Cerebellar testing reveals good finger-nose-finger and heel-to-shin bilaterally.  Gait and station: In a wheelchair, able to stand by pushing off from wheelchair, requires 2 person assist to ambulate, unsteady, usually uses a walker Reflexes: Deep tendon reflexes are symmetric and normal bilaterally.   DIAGNOSTIC DATA (LABS, IMAGING, TESTING) - I reviewed patient records, labs, notes, testing and imaging myself where available.  Lab Results  Component Value  Date   WBC 9.4 08/29/2018   HGB 12.7 08/29/2018   HCT 41.0 08/29/2018   MCV 104.3 (H) 08/29/2018   PLT 243 08/29/2018      Component Value Date/Time   NA 141 08/29/2018 1533   NA 142 02/09/2015 1021   K 4.2 08/29/2018 1533   CL 105 08/29/2018 1533   CO2 25 08/29/2018 1533   GLUCOSE 121 (H) 08/29/2018 1533   BUN 21 08/29/2018 1533   BUN 17 02/09/2015 1021   CREATININE 1.35 (H) 08/29/2018 1533   CALCIUM 8.9 08/29/2018 1533   PROT 7.2 08/29/2018 1533   PROT 6.4 02/09/2015 1021   ALBUMIN 3.9 08/29/2018 1533   ALBUMIN 4.3 02/09/2015 1021   AST 29 08/29/2018 1533   ALT 25 08/29/2018 1533   ALKPHOS 61 08/29/2018 1533   BILITOT 0.6 08/29/2018 1533   BILITOT 0.9 02/09/2015 1021   GFRNONAA 37 (L) 08/29/2018 1533   GFRAA 43 (L) 08/29/2018 1533   Lab Results  Component Value Date   CHOL  117 10/14/2012   HDL 42 10/14/2012   LDLCALC 40 10/14/2012   TRIG 175 (H) 10/14/2012   CHOLHDL 2.8 10/14/2012   Lab Results  Component Value Date   HGBA1C 7.0 (H) 10/12/2012   No results found for: [id number] Lab Results  Component Value Date   TSH 2.030 07/14/2014   MRI of the brain 08/29/2018 IMPRESSION: 1. No acute intracranial abnormality identified. 2. Progression of mild for age chronic microvascular ischemic changes and volume loss of the brain from 2014. Stable chronic infarction of the right basal ganglia. 3. Partial right mastoid opacification.  ASSESSMENT AND PLAN 83 y.o. year old female  has a past medical history of Anxiety, Dementia ([place]), Depression, Diabetes mellitus (2011), Headache(784.0), History of echocardiogram, Hypertension, Seizure ([place]), Stroke ([place]) (2002), and UTI (urinary tract infection) (02/2015). here with:  1.  Memory loss -Continue Aricept 10 mg at bedtime, Namenda 10 mg twice a day -Memory score improved 27/30  2.  Seizures -She has not had recurrent seizure -Continue Keppra 500 mg in the morning, 1000 mg in the evening  3.  Gait abnormality  -Continue walker use for safe ambulation -Continue exercise with bike at home  -ER 08/29/2018 for report of transient left leg weakness/numbness, MRI of the brain was negative for acute pathology, She remains on daily aspirin, the weakness/numbness improved per patient with rest and exercise -She will follow-up in 6 months with Dr. [name] or sooner if needed.  I did advise that if her symptoms worsen or she develops any new symptoms she should let us know. Continue follow-up with PCP, appointment scheduled for next month.   I spent 25 minutes with the patient. 50% of this time was spent discussing her plan of care.   Butler Denmark, AGNP-C, DNP 03/25/2019, 9:14 AM [facility] [address] [place], [place] [address] [phone]

## 2019-03-25 ENCOUNTER — Ambulatory Visit: Payer: Medicare Other | Admitting: Neurology

## 2019-03-25 ENCOUNTER — Other Ambulatory Visit: Payer: Self-pay

## 2019-03-25 ENCOUNTER — Encounter: Payer: Self-pay | Admitting: Neurology

## 2019-03-25 VITALS — BP 146/72 | HR 74 | Temp 97.7°F | Ht 63.0 in | Wt 200.4 lb

## 2019-03-25 DIAGNOSIS — F0391 Unspecified dementia with behavioral disturbance: Secondary | ICD-10-CM | POA: Diagnosis not present

## 2019-03-25 DIAGNOSIS — G40909 Epilepsy, unspecified, not intractable, without status epilepticus: Secondary | ICD-10-CM

## 2019-03-25 DIAGNOSIS — R269 Unspecified abnormalities of gait and mobility: Secondary | ICD-10-CM | POA: Diagnosis not present

## 2019-03-25 MED ORDER — DONEPEZIL HCL 10 MG PO TABS: 10.0000 mg | ORAL_TABLET | Freq: Every day | ORAL | 1 refills | Status: DC

## 2019-03-25 MED ORDER — LEVETIRACETAM 500 MG PO TABS: ORAL_TABLET | ORAL | 3 refills | Status: DC

## 2019-03-25 NOTE — Patient Instructions (Signed)
Your memory score was 27/30, improved from 20/30. Please continue Namenda and Aricept, Keppra for seizures.

## 2019-03-31 NOTE — Progress Notes (Signed)
I have reviewed and agreed above plan. 

## 2019-04-09 ENCOUNTER — Other Ambulatory Visit: Payer: Self-pay | Admitting: Neurology

## 2019-04-21 DIAGNOSIS — E7800 Pure hypercholesterolemia, unspecified: Secondary | ICD-10-CM | POA: Diagnosis not present

## 2019-04-21 DIAGNOSIS — E1142 Type 2 diabetes mellitus with diabetic polyneuropathy: Secondary | ICD-10-CM | POA: Diagnosis not present

## 2019-04-21 DIAGNOSIS — I10 Essential (primary) hypertension: Secondary | ICD-10-CM | POA: Diagnosis not present

## 2019-04-21 DIAGNOSIS — M150 Primary generalized (osteo)arthritis: Secondary | ICD-10-CM | POA: Diagnosis not present

## 2019-04-23 DIAGNOSIS — F411 Generalized anxiety disorder: Secondary | ICD-10-CM | POA: Diagnosis not present

## 2019-04-23 DIAGNOSIS — F331 Major depressive disorder, recurrent, moderate: Secondary | ICD-10-CM | POA: Diagnosis not present

## 2019-04-23 DIAGNOSIS — F5101 Primary insomnia: Secondary | ICD-10-CM | POA: Diagnosis not present

## 2019-04-23 DIAGNOSIS — F0280 Dementia in other diseases classified elsewhere without behavioral disturbance: Secondary | ICD-10-CM | POA: Diagnosis not present

## 2019-04-24 DIAGNOSIS — Z23 Encounter for immunization: Secondary | ICD-10-CM | POA: Diagnosis not present

## 2019-04-24 DIAGNOSIS — E1142 Type 2 diabetes mellitus with diabetic polyneuropathy: Secondary | ICD-10-CM | POA: Diagnosis not present

## 2019-04-24 DIAGNOSIS — E7800 Pure hypercholesterolemia, unspecified: Secondary | ICD-10-CM | POA: Diagnosis not present

## 2019-05-07 DIAGNOSIS — M549 Dorsalgia, unspecified: Secondary | ICD-10-CM | POA: Diagnosis not present

## 2019-05-07 DIAGNOSIS — M150 Primary generalized (osteo)arthritis: Secondary | ICD-10-CM | POA: Diagnosis not present

## 2019-05-07 DIAGNOSIS — E1142 Type 2 diabetes mellitus with diabetic polyneuropathy: Secondary | ICD-10-CM | POA: Diagnosis not present

## 2019-06-07 DIAGNOSIS — M150 Primary generalized (osteo)arthritis: Secondary | ICD-10-CM | POA: Diagnosis not present

## 2019-06-07 DIAGNOSIS — M549 Dorsalgia, unspecified: Secondary | ICD-10-CM | POA: Diagnosis not present

## 2019-06-07 DIAGNOSIS — E1142 Type 2 diabetes mellitus with diabetic polyneuropathy: Secondary | ICD-10-CM | POA: Diagnosis not present

## 2019-06-17 ENCOUNTER — Emergency Department (HOSPITAL_COMMUNITY)
Admission: EM | Admit: 2019-06-17 | Discharge: 2019-06-17 | Disposition: A | Discharge status: Home / Self Care | Payer: Medicare Other | Attending: Emergency Medicine | Admitting: Emergency Medicine

## 2019-06-17 ENCOUNTER — Encounter (HOSPITAL_COMMUNITY): Payer: Self-pay | Admitting: Emergency Medicine

## 2019-06-17 DIAGNOSIS — Z79899 Other long term (current) drug therapy: Secondary | ICD-10-CM | POA: Diagnosis not present

## 2019-06-17 DIAGNOSIS — Z7982 Long term (current) use of aspirin: Secondary | ICD-10-CM | POA: Diagnosis not present

## 2019-06-17 DIAGNOSIS — R531 Weakness: Secondary | ICD-10-CM | POA: Diagnosis present

## 2019-06-17 DIAGNOSIS — Y999 Unspecified external cause status: Secondary | ICD-10-CM | POA: Diagnosis not present

## 2019-06-17 DIAGNOSIS — R0902 Hypoxemia: Secondary | ICD-10-CM | POA: Diagnosis not present

## 2019-06-17 DIAGNOSIS — I10 Essential (primary) hypertension: Secondary | ICD-10-CM | POA: Diagnosis not present

## 2019-06-17 DIAGNOSIS — I2510 Atherosclerotic heart disease of native coronary artery without angina pectoris: Secondary | ICD-10-CM | POA: Insufficient documentation

## 2019-06-17 DIAGNOSIS — R0602 Shortness of breath: Secondary | ICD-10-CM | POA: Diagnosis not present

## 2019-06-17 DIAGNOSIS — Z7984 Long term (current) use of oral hypoglycemic drugs: Secondary | ICD-10-CM | POA: Diagnosis not present

## 2019-06-17 DIAGNOSIS — Y92009 Unspecified place in unspecified non-institutional (private) residence as the place of occurrence of the external cause: Secondary | ICD-10-CM | POA: Diagnosis not present

## 2019-06-17 DIAGNOSIS — Y9389 Activity, other specified: Secondary | ICD-10-CM | POA: Insufficient documentation

## 2019-06-17 DIAGNOSIS — W010XXA Fall on same level from slipping, tripping and stumbling without subsequent striking against object, initial encounter: Secondary | ICD-10-CM | POA: Diagnosis not present

## 2019-06-17 DIAGNOSIS — E119 Type 2 diabetes mellitus without complications: Secondary | ICD-10-CM | POA: Diagnosis not present

## 2019-06-17 DIAGNOSIS — R069 Unspecified abnormalities of breathing: Secondary | ICD-10-CM | POA: Diagnosis not present

## 2019-06-17 DIAGNOSIS — N3000 Acute cystitis without hematuria: Secondary | ICD-10-CM | POA: Diagnosis not present

## 2019-06-17 LAB — URINALYSIS, ROUTINE W REFLEX MICROSCOPIC
Bilirubin Urine: NEGATIVE
Glucose, UA: NEGATIVE mg/dL
Hgb urine dipstick: NEGATIVE
Ketones, ur: NEGATIVE mg/dL
Nitrite: POSITIVE — AB
Protein, ur: NEGATIVE mg/dL
Specific Gravity, Urine: 1.017 (ref 1.005–1.030)
pH: 7 (ref 5.0–8.0)

## 2019-06-17 LAB — BASIC METABOLIC PANEL
Anion gap: 16 — ABNORMAL HIGH (ref 5–15)
BUN: 18 mg/dL (ref 8–23)
CO2: 24 mmol/L (ref 22–32)
Calcium: 9.7 mg/dL (ref 8.9–10.3)
Chloride: 101 mmol/L (ref 98–111)
Creatinine, Ser: 1.35 mg/dL — ABNORMAL HIGH (ref 0.44–1.00)
GFR calc Af Amer: 43 mL/min — ABNORMAL LOW (ref >60)
GFR calc non Af Amer: 37 mL/min — ABNORMAL LOW (ref >60)
Glucose, Bld: 91 mg/dL (ref 70–99)
Potassium: 4.8 mmol/L (ref 3.5–5.1)
Sodium: 141 mmol/L (ref 135–145)

## 2019-06-17 LAB — CBC
HCT: 42.7 % (ref 36.0–46.0)
Hemoglobin: 13.3 g/dL (ref 12.0–15.0)
MCH: 31.7 pg (ref 26.0–34.0)
MCHC: 31.1 g/dL (ref 30.0–36.0)
MCV: 101.7 fL — ABNORMAL HIGH (ref 80.0–100.0)
Platelets: 227 10*3/uL (ref 150–400)
RBC: 4.2 MIL/uL (ref 3.87–5.11)
RDW: 12.6 % (ref 11.5–15.5)
WBC: 9.6 10*3/uL (ref 4.0–10.5)
nRBC: 0 % (ref 0.0–0.2)

## 2019-06-17 MED ORDER — SODIUM CHLORIDE 0.9 % IV BOLUS
500.0000 mL | Freq: Once | INTRAVENOUS | Status: AC
  Administered 2019-06-17: 500 mL via INTRAVENOUS

## 2019-06-17 MED ORDER — SODIUM CHLORIDE 0.9 % IV SOLN
1.0000 g | Freq: Once | INTRAVENOUS | Status: AC
  Administered 2019-06-17: 1 g via INTRAVENOUS
  Filled 2019-06-17: qty 10

## 2019-06-17 MED ORDER — CEPHALEXIN 500 MG PO CAPS: 500.0000 mg | ORAL_CAPSULE | Freq: Four times a day (QID) | ORAL | 0 refills | Status: DC

## 2019-06-17 NOTE — ED Triage Notes (Signed)
Patient coming from home reports falling this am. Unwitnessed fall but patient states she fell onto her butt. Denies any LOC or hitting head. C/o weakness all over and worse in the legs. Family adds patient has starting coughing over the weekend. Right sided deficits from previous stroke.

## 2019-06-17 NOTE — ED Notes (Signed)
Pt ambulated inside the room. Pt stable on feet. Steady gait with O2 of 93% RA.

## 2019-06-17 NOTE — ED Triage Notes (Signed)
PY  Reports she has been coughing a whole lot. Pt reports a dry cough.

## 2019-06-17 NOTE — Discharge Instructions (Signed)
Begin taking Keflex as prescribed.  Return to the emergency department for worsening weakness, high fever, difficulty breathing, or other new and concerning symptoms.

## 2019-06-17 NOTE — ED Notes (Signed)
Martha Olsen (Daughter#[phone]) called/will be outside when ready for her to come in.

## 2019-06-17 NOTE — ED Triage Notes (Signed)
Pt reports she uses a walker and was up to the walker to BR once with out problem. Pt reported she fell this morning when going to BR with her walker. When Pt arrived to room  46 she needed 2 person assist to transfer from [facility] to BED.

## 2019-06-17 NOTE — ED Provider Notes (Signed)
[facility] EMERGENCY DEPARTMENT Provider Note   CSN: [id number] Arrival date & time: 06/17/19  1050     History   Chief Complaint Chief Complaint  Patient presents with  . Weakness  . Fall    HPI Martha Olsen is a 83 y.o. female.     Patient is an 83 year old female with past medical history of hypertension, dementia, diabetes, prior CVA.  She presents today for evaluation of weakness.  Patient went to bed yesterday evening and woke this morning feeling well.  When she attempted to stand and ambulate, she states that her legs "gave out on her" and she fell to the floor.  She denies having injured herself.  She denies fevers or chills.  She denies chest pain or difficulty breathing.  The history is provided by the patient.  Weakness Severity:  Moderate Onset quality:  Sudden Timing:  Constant Progression:  Unchanged Chronicity:  New Relieved by:  Nothing Worsened by:  Nothing Ineffective treatments:  None tried Associated symptoms: no abdominal pain, no cough and no dysuria   Fall Pertinent negatives include no abdominal pain.    Past Medical History:  Diagnosis Date  . Anxiety   . Dementia ([place])   . Depression   . Diabetes mellitus 2011  . Headache(784.0)   . History of echocardiogram    Echo 5/16: EF 55-60%, normal wall motion, grade 1 diastolic dysfunction  . Hypertension   . Seizure ([place])   . Stroke [facility]) 2002   right side weakness  . UTI (urinary tract infection) 02/2015    Patient Active Problem List   Diagnosis Date Noted  . Fall 03/01/2015  . Pressure ulcer 03/01/2015  . Blood poisoning   . Dementia ([place])   . Essential hypertension 02/28/2015  . GERD (gastroesophageal reflux disease) 02/28/2015  . UTI (lower urinary tract infection) 02/28/2015  . Sepsis ([place]) 02/28/2015  . Acute encephalopathy 02/28/2015  . Excessive daytime sleepiness 09/03/2014  . Abnormality of gait 08/18/2014  . Urinary urgency 08/18/2014  . Sleep  apnea 07/14/2014  . CAD (coronary artery disease) 12/11/2012  . Abnormal ECG 11/14/2012  . Stroke ([place])   . Hypertension   . Anxiety   . Depression   . Seizure ([place])   . Seizure disorder ([place]) 10/12/2012  . Elevated troponin I level 10/12/2012  . Diarrhea 10/12/2012  . Pneumonia 10/12/2012  . Hypokalemia 10/12/2012  . DM (diabetes mellitus) ([place]) 10/12/2012  . HTN (hypertension) 10/12/2012  . Dyslipidemia 10/12/2012  . Rotator cuff tear arthropathy, left 12/08/2011    Past Surgical History:  Procedure Laterality Date  . ABDOMINAL HYSTERECTOMY  1980's  . APPENDECTOMY  1980's  . CHOLECYSTECTOMY  1993  . EYE SURGERY  2009  . FOOT SURGERY    . JOINT REPLACEMENT  2012   Hip, Knee  . REVERSE SHOULDER ARTHROPLASTY  12/07/2011   Procedure: REVERSE SHOULDER ARTHROPLASTY;  Surgeon: Marin Shutter, MD;  Location: [place];  Service: Orthopedics;  Laterality: Left;  left total reverse shoulder  . TONSILLECTOMY  1951     OB History   No obstetric history on file.      Home Medications    Prior to Admission medications   Medication Sig Start Date End Date Taking? Authorizing Provider  ARIPiprazole (ABILIFY) 5 MG tablet  02/13/19   [provider]  aspirin 81 MG chewable tablet Chew 1 tablet (81 mg total) by mouth daily. 10/14/12   Barton Dubois, MD  atorvastatin (LIPITOR) 40 MG  tablet Take 40 mg by mouth daily.    [provider]  busPIRone (BUSPAR) 15 MG tablet Take 15 mg by mouth 3 (three) times daily.    [provider]  carvedilol (COREG) 6.25 MG tablet Take 1 tablet (6.25 mg total) by mouth 2 (two) times daily with a meal. 11/12/18   Suzzanne Cloud, NP  donepezil (ARICEPT) 10 MG tablet Take 1 tablet (10 mg total) by mouth at bedtime. 03/25/19   Suzzanne Cloud, NP  gabapentin (NEURONTIN) 300 MG capsule Take 600 mg by mouth 2 (two) times daily. 06/27/18   [provider]  glimepiride (AMARYL) 2 MG tablet Take 2 mg by mouth daily before breakfast.     [provider]  levETIRAcetam (KEPPRA) 500 MG tablet 1 tablet every morning, 2 tablets every night 03/25/19   Suzzanne Cloud, NP  lisinopril-hydrochlorothiazide (PRINZIDE,ZESTORETIC) 20-12.5 MG per tablet Take 1 tablet by mouth daily.    [provider]  LORazepam (ATIVAN) 2 MG tablet Take 2 mg by mouth at bedtime.     [provider]  memantine (NAMENDA) 10 MG tablet Take 1 tablet (10 mg total) by mouth 2 (two) times daily. 09/19/18   Suzzanne Cloud, NP  metFORMIN (GLUCOPHAGE) 1000 MG tablet Take 1,000 mg by mouth daily with breakfast.  07/01/14   [provider]  mirtazapine (REMERON) 15 MG tablet Take 15 mg by mouth at bedtime. 08/02/18   [provider]  nitroGLYCERIN (NITROSTAT) 0.4 MG SL tablet Place 1 tablet (0.4 mg total) under the tongue every 5 (five) minutes as needed for chest pain. 12/11/12   Josue Hector, MD  ranitidine (ZANTAC) 150 MG tablet Take 150 mg by mouth 2 (two) times daily as needed for heartburn.    [provider]  repaglinide (PRANDIN) 0.5 MG tablet  03/05/19   [provider]  sertraline (ZOLOFT) 50 MG tablet TAKE 1&1/2 TABLETS BY MOUTH DAILY 04/09/19   Suzzanne Cloud, NP  trimethoprim (TRIMPEX) 100 MG tablet Take 100 mg by mouth daily. 07/30/18   [provider]  VIIBRYD 40 MG TABS  02/13/19   [provider]  zaleplon (SONATA) 10 MG capsule  03/10/19   [provider]    Family History Family History  Problem Relation Age of Onset  . Stroke Mother 66  . Hypertension Mother   . CAD Father 67  . Heart attack Father   . Anesthesia problems Neg Hx     Social History Social History   Tobacco Use  . Smoking status: Never Smoker  . Smokeless tobacco: Never Used  Substance Use Topics  . Alcohol use: No    Alcohol/week: 0.0 standard drinks  . Drug use: No     Allergies   Codeine   Review of Systems Review of Systems  Respiratory: Negative for cough.   Gastrointestinal:  Negative for abdominal pain.  Genitourinary: Negative for dysuria.  Neurological: Positive for weakness.  All other systems reviewed and are negative.    Physical Exam Updated Vital Signs BP (!) 161/74 (BP Location: Left Wrist)   Pulse 82   Temp 99.2 F (37.3 C) (Oral)   Resp 16   SpO2 94%   Physical Exam Vitals signs and nursing note reviewed.  Constitutional:      General: She is not in acute distress.    Appearance: She is well-developed. She is not diaphoretic.  HENT:     Head: Normocephalic and atraumatic.  Neck:  Musculoskeletal: Normal range of motion and neck supple.  Cardiovascular:     Rate and Rhythm: Normal rate and regular rhythm.     Heart sounds: No murmur. No friction rub. No gallop.   Pulmonary:     Effort: Pulmonary effort is normal. No respiratory distress.     Breath sounds: Normal breath sounds. No wheezing.  Abdominal:     General: Bowel sounds are normal. There is no distension.     Palpations: Abdomen is soft.     Tenderness: There is no abdominal tenderness.  Musculoskeletal: Normal range of motion.  Skin:    General: Skin is warm and dry.  Neurological:     Mental Status: She is alert and oriented to person, place, and time.     Cranial Nerves: No cranial nerve deficit.     Sensory: No sensory deficit.     Motor: Weakness present.     Coordination: Coordination normal.     Comments: Strength is 4 out of 5 in both lower extremities.      ED Treatments / Results  Labs (all labs ordered are listed, but only abnormal results are displayed) Labs Reviewed  BASIC METABOLIC PANEL  CBC  URINALYSIS, ROUTINE W REFLEX MICROSCOPIC    EKG EKG Interpretation  Date/Time:  Tuesday June 17 2019 10:53:44 EST Ventricular Rate:  86 PR Interval:    QRS Duration: 92 QT Interval:  386 QTC Calculation: 461 R Axis:   51 Text Interpretation: Sinus rhythm Confirmed by Veryl Speak [phone]) on 06/17/2019 11:41:47 AM   Radiology No results  found.  Procedures Procedures (including critical care time)  Medications Ordered in ED Medications  sodium chloride 0.9 % bolus 500 mL (has no administration in time range)     Initial Impression / Assessment and Plan / ED Course  I have reviewed the triage vital signs and the nursing notes.  Pertinent labs & imaging results that were available during my care of the patient were reviewed by me and considered in my medical decision making (see chart for details).  Patient brought by EMS for evaluation of weakness.  She attempted to ambulate today and her legs gave out on her causing her to slide to the floor.  She was uninjured during the fall.  Patient's work-up shows unremarkable laboratory studies, but does show evidence for a UTI.  Patient will be given IV antibiotics.  She was ambulated in the emergency department and tolerated this well.  I spoke with the patient's daughter who is comfortable with her going home.  Patient will be discharged with Keflex and as needed return.  Final Clinical Impressions(s) / ED Diagnoses   Final diagnoses:  None    ED Discharge Orders    None       Veryl Speak, MD 06/18/19 [phone]

## 2019-06-19 LAB — URINE CULTURE: Culture: >=100000 — AB

## 2019-06-20 ENCOUNTER — Telehealth: Payer: Self-pay | Admitting: *Deleted

## 2019-06-20 NOTE — Telephone Encounter (Signed)
Post ED Visit - Positive Culture Follow-up  Culture report reviewed by antimicrobial stewardship pharmacist: [place] Team []  Elenor Quinones, Pharm.D. []  Heide Guile, Pharm.D., BCPS AQ-ID []  Parks Neptune, Pharm.D., BCPS []  Alycia Rossetti, Pharm.D., BCPS []  [place], Pharm.D., BCPS, AAHIVP []  Legrand Como, Pharm.D., BCPS, AAHIVP []  Salome Arnt, PharmD, BCPS []  Johnnette Gourd, PharmD, BCPS []  Hughes Better, PharmD, BCPS []  Leeroy Cha, PharmD []  Laqueta Linden, PharmD, BCPS []  Albertina Parr, PharmD Gorden Harms, PharmD  [place] Team []  Leodis Sias, PharmD []  Lindell Spar, PharmD []  Royetta Asal, PharmD []  Graylin Shiver, Rph []  Rema Fendt) Glennon Mac, PharmD []  Arlyn Dunning, PharmD []  Netta Cedars, PharmD []  Dia Sitter, PharmD []  Leone Haven, PharmD []  Gretta Arab, PharmD []  Theodis Shove, PharmD []  Peggyann Juba, PharmD []  Reuel Boom, PharmD   Positive urine culture Treated with Cephalexin, organism sensitive to the same and no further patient follow-up is required at this time.  Harlon Flor [facility] 06/20/2019, 10:27 AM

## 2019-07-07 DIAGNOSIS — M150 Primary generalized (osteo)arthritis: Secondary | ICD-10-CM | POA: Diagnosis not present

## 2019-07-07 DIAGNOSIS — E1142 Type 2 diabetes mellitus with diabetic polyneuropathy: Secondary | ICD-10-CM | POA: Diagnosis not present

## 2019-07-07 DIAGNOSIS — M549 Dorsalgia, unspecified: Secondary | ICD-10-CM | POA: Diagnosis not present

## 2019-07-22 DIAGNOSIS — F0280 Dementia in other diseases classified elsewhere without behavioral disturbance: Secondary | ICD-10-CM | POA: Diagnosis not present

## 2019-07-22 DIAGNOSIS — F5101 Primary insomnia: Secondary | ICD-10-CM | POA: Diagnosis not present

## 2019-07-22 DIAGNOSIS — F411 Generalized anxiety disorder: Secondary | ICD-10-CM | POA: Diagnosis not present

## 2019-07-22 DIAGNOSIS — F331 Major depressive disorder, recurrent, moderate: Secondary | ICD-10-CM | POA: Diagnosis not present

## 2019-07-31 ENCOUNTER — Other Ambulatory Visit: Payer: Self-pay | Admitting: Neurology

## 2019-08-07 DIAGNOSIS — M549 Dorsalgia, unspecified: Secondary | ICD-10-CM | POA: Diagnosis not present

## 2019-08-07 DIAGNOSIS — M150 Primary generalized (osteo)arthritis: Secondary | ICD-10-CM | POA: Diagnosis not present

## 2019-08-07 DIAGNOSIS — E1142 Type 2 diabetes mellitus with diabetic polyneuropathy: Secondary | ICD-10-CM | POA: Diagnosis not present

## 2019-08-26 DIAGNOSIS — N390 Urinary tract infection, site not specified: Secondary | ICD-10-CM | POA: Diagnosis not present

## 2019-08-26 DIAGNOSIS — E860 Dehydration: Secondary | ICD-10-CM | POA: Diagnosis not present

## 2019-09-07 DIAGNOSIS — E1142 Type 2 diabetes mellitus with diabetic polyneuropathy: Secondary | ICD-10-CM | POA: Diagnosis not present

## 2019-09-07 DIAGNOSIS — M549 Dorsalgia, unspecified: Secondary | ICD-10-CM | POA: Diagnosis not present

## 2019-09-07 DIAGNOSIS — M150 Primary generalized (osteo)arthritis: Secondary | ICD-10-CM | POA: Diagnosis not present

## 2019-09-10 DIAGNOSIS — Z9181 History of falling: Secondary | ICD-10-CM | POA: Diagnosis not present

## 2019-09-10 DIAGNOSIS — Z87440 Personal history of urinary (tract) infections: Secondary | ICD-10-CM | POA: Diagnosis not present

## 2019-09-10 DIAGNOSIS — F0390 Unspecified dementia without behavioral disturbance: Secondary | ICD-10-CM | POA: Diagnosis not present

## 2019-09-10 DIAGNOSIS — E1122 Type 2 diabetes mellitus with diabetic chronic kidney disease: Secondary | ICD-10-CM | POA: Diagnosis not present

## 2019-09-10 DIAGNOSIS — R32 Unspecified urinary incontinence: Secondary | ICD-10-CM | POA: Diagnosis not present

## 2019-09-10 DIAGNOSIS — E1142 Type 2 diabetes mellitus with diabetic polyneuropathy: Secondary | ICD-10-CM | POA: Diagnosis not present

## 2019-09-10 DIAGNOSIS — I509 Heart failure, unspecified: Secondary | ICD-10-CM | POA: Diagnosis not present

## 2019-09-10 DIAGNOSIS — I130 Hypertensive heart and chronic kidney disease with heart failure and stage 1 through stage 4 chronic kidney disease, or unspecified chronic kidney disease: Secondary | ICD-10-CM | POA: Diagnosis not present

## 2019-09-10 DIAGNOSIS — Z7982 Long term (current) use of aspirin: Secondary | ICD-10-CM | POA: Diagnosis not present

## 2019-09-10 DIAGNOSIS — N183 Chronic kidney disease, stage 3 unspecified: Secondary | ICD-10-CM | POA: Diagnosis not present

## 2019-09-10 DIAGNOSIS — E1151 Type 2 diabetes mellitus with diabetic peripheral angiopathy without gangrene: Secondary | ICD-10-CM | POA: Diagnosis not present

## 2019-09-10 DIAGNOSIS — F329 Major depressive disorder, single episode, unspecified: Secondary | ICD-10-CM | POA: Diagnosis not present

## 2019-09-10 DIAGNOSIS — M8580 Other specified disorders of bone density and structure, unspecified site: Secondary | ICD-10-CM | POA: Diagnosis not present

## 2019-09-10 DIAGNOSIS — Z7984 Long term (current) use of oral hypoglycemic drugs: Secondary | ICD-10-CM | POA: Diagnosis not present

## 2019-09-10 DIAGNOSIS — G40909 Epilepsy, unspecified, not intractable, without status epilepticus: Secondary | ICD-10-CM | POA: Diagnosis not present

## 2019-09-22 ENCOUNTER — Ambulatory Visit: Payer: Medicare Other | Admitting: Neurology

## 2019-09-24 DIAGNOSIS — F0280 Dementia in other diseases classified elsewhere without behavioral disturbance: Secondary | ICD-10-CM | POA: Diagnosis not present

## 2019-09-24 DIAGNOSIS — F331 Major depressive disorder, recurrent, moderate: Secondary | ICD-10-CM | POA: Diagnosis not present

## 2019-09-24 DIAGNOSIS — F411 Generalized anxiety disorder: Secondary | ICD-10-CM | POA: Diagnosis not present

## 2019-09-24 DIAGNOSIS — F5101 Primary insomnia: Secondary | ICD-10-CM | POA: Diagnosis not present

## 2019-09-29 ENCOUNTER — Other Ambulatory Visit: Payer: Self-pay | Admitting: Neurology

## 2019-10-05 DIAGNOSIS — M150 Primary generalized (osteo)arthritis: Secondary | ICD-10-CM | POA: Diagnosis not present

## 2019-10-05 DIAGNOSIS — E1142 Type 2 diabetes mellitus with diabetic polyneuropathy: Secondary | ICD-10-CM | POA: Diagnosis not present

## 2019-10-05 DIAGNOSIS — M549 Dorsalgia, unspecified: Secondary | ICD-10-CM | POA: Diagnosis not present

## 2019-10-10 DIAGNOSIS — E1142 Type 2 diabetes mellitus with diabetic polyneuropathy: Secondary | ICD-10-CM | POA: Diagnosis not present

## 2019-10-10 DIAGNOSIS — I509 Heart failure, unspecified: Secondary | ICD-10-CM | POA: Diagnosis not present

## 2019-10-10 DIAGNOSIS — E1122 Type 2 diabetes mellitus with diabetic chronic kidney disease: Secondary | ICD-10-CM | POA: Diagnosis not present

## 2019-10-10 DIAGNOSIS — E1151 Type 2 diabetes mellitus with diabetic peripheral angiopathy without gangrene: Secondary | ICD-10-CM | POA: Diagnosis not present

## 2019-10-10 DIAGNOSIS — F0390 Unspecified dementia without behavioral disturbance: Secondary | ICD-10-CM | POA: Diagnosis not present

## 2019-10-10 DIAGNOSIS — Z87440 Personal history of urinary (tract) infections: Secondary | ICD-10-CM | POA: Diagnosis not present

## 2019-10-10 DIAGNOSIS — F329 Major depressive disorder, single episode, unspecified: Secondary | ICD-10-CM | POA: Diagnosis not present

## 2019-10-10 DIAGNOSIS — N183 Chronic kidney disease, stage 3 unspecified: Secondary | ICD-10-CM | POA: Diagnosis not present

## 2019-10-10 DIAGNOSIS — I130 Hypertensive heart and chronic kidney disease with heart failure and stage 1 through stage 4 chronic kidney disease, or unspecified chronic kidney disease: Secondary | ICD-10-CM | POA: Diagnosis not present

## 2019-10-10 DIAGNOSIS — Z9181 History of falling: Secondary | ICD-10-CM | POA: Diagnosis not present

## 2019-10-10 DIAGNOSIS — R32 Unspecified urinary incontinence: Secondary | ICD-10-CM | POA: Diagnosis not present

## 2019-10-10 DIAGNOSIS — Z7984 Long term (current) use of oral hypoglycemic drugs: Secondary | ICD-10-CM | POA: Diagnosis not present

## 2019-10-10 DIAGNOSIS — Z7982 Long term (current) use of aspirin: Secondary | ICD-10-CM | POA: Diagnosis not present

## 2019-10-10 DIAGNOSIS — M8580 Other specified disorders of bone density and structure, unspecified site: Secondary | ICD-10-CM | POA: Diagnosis not present

## 2019-10-10 DIAGNOSIS — G40909 Epilepsy, unspecified, not intractable, without status epilepticus: Secondary | ICD-10-CM | POA: Diagnosis not present

## 2019-10-20 ENCOUNTER — Other Ambulatory Visit: Payer: Self-pay | Admitting: Neurology

## 2019-10-21 DIAGNOSIS — E7800 Pure hypercholesterolemia, unspecified: Secondary | ICD-10-CM | POA: Diagnosis not present

## 2019-10-21 DIAGNOSIS — I10 Essential (primary) hypertension: Secondary | ICD-10-CM | POA: Diagnosis not present

## 2019-10-21 DIAGNOSIS — E1142 Type 2 diabetes mellitus with diabetic polyneuropathy: Secondary | ICD-10-CM | POA: Diagnosis not present

## 2019-10-21 DIAGNOSIS — M150 Primary generalized (osteo)arthritis: Secondary | ICD-10-CM | POA: Diagnosis not present

## 2019-11-03 ENCOUNTER — Other Ambulatory Visit: Payer: Self-pay | Admitting: Neurology

## 2019-11-05 DIAGNOSIS — M549 Dorsalgia, unspecified: Secondary | ICD-10-CM | POA: Diagnosis not present

## 2019-11-05 DIAGNOSIS — E1142 Type 2 diabetes mellitus with diabetic polyneuropathy: Secondary | ICD-10-CM | POA: Diagnosis not present

## 2019-11-05 DIAGNOSIS — M150 Primary generalized (osteo)arthritis: Secondary | ICD-10-CM | POA: Diagnosis not present

## 2019-11-06 ENCOUNTER — Other Ambulatory Visit: Payer: Self-pay | Admitting: Neurology

## 2019-11-13 ENCOUNTER — Other Ambulatory Visit: Payer: Self-pay

## 2019-11-13 ENCOUNTER — Ambulatory Visit: Payer: Medicare Other | Admitting: Neurology

## 2019-11-13 ENCOUNTER — Encounter: Payer: Self-pay | Admitting: Neurology

## 2019-11-13 VITALS — BP 123/58 | HR 72 | Temp 97.6°F

## 2019-11-13 DIAGNOSIS — G40909 Epilepsy, unspecified, not intractable, without status epilepticus: Secondary | ICD-10-CM

## 2019-11-13 DIAGNOSIS — R269 Unspecified abnormalities of gait and mobility: Secondary | ICD-10-CM | POA: Diagnosis not present

## 2019-11-13 DIAGNOSIS — F0391 Unspecified dementia with behavioral disturbance: Secondary | ICD-10-CM | POA: Diagnosis not present

## 2019-11-13 NOTE — Progress Notes (Signed)
PATIENT: Martha Olsen DOB: 03-Nov-1935  REASON FOR VISIT: follow up HISTORY FROM: patient  HISTORY OF PRESENT ILLNESS: Martha Olsen,seen in refer by her primary care physician Dr. Donavan Olsen for evaluation of memory loss.  She has past medical history of HTN, s/p CVA right basal ganglia 11/2001, dyslipidemia, DM, anxiety/depression, s/p appendectomy, s/p hysterectomy, DJD, s/p left hip replacement 03/2002, left knee replacement 01/2011, foot surgery and reverse left shoulder arthroplasty 11/2011.  She had 9 years of education, she was a Chartered certified accountant, she worked at [employer], [employer], retried at age [place] care of her grandchildren. She denies family history of memory loss. Mother died at age 3 from stroke.  I saw her initially following her hospital discharge in March 2013,  In October 13 2011, she was at home, sitting at the rocking chair, was noted by her family fell over the chair, had generalized tonic-clonic seizure, patient has no warning signs, no collection of the event, EMS was called, she was taken by ambulance to the hospital, she could not remember waking up in the hospital confused, she was put on Keppra 500 mg twice a day, tolerating it well, there was no recurrent seizure.  EEG was normal. MRI of the brainin 2013has demonstrated old basal ganglion, and coronal radiata stroke, no acute lesions,  She complains of headache everyday, right temporal regions, light noise is bothersome,   She has quit driving since [age], husband retired, she is active at home, house work  She has no recurrent seizures, tolerating Keppra 500 mg twice a day without significant side effect, she has increased gait difficulty because of joints, knee pain, She has been complaining of a year history of worsening headaches, bilateral frontal, vortex region, she has to take ibuprofen 200 mg 2-3 tablets each time, sometimes two dosage each day  She was given prescription of  tramadol 50 mg as neededinDecember 2015, her headache has much improved, taking tramadol about twice each week, she continue has bilateral knee pain, low back pain, gait difficulty, she also has nocturia, frequent awakening, loss snoring, excessive daytime sleepiness, fatigue, sleep studies pending in August 25 2014  ESR, TSH was normal December 2015, mild elevated C-reactive protein 11.  UPDATE February 18 2015: She is accompanied by her husband, and daughter at today's clinical visit,  She had a urgent visit with Dr. Jaynee Olsen in February 09 2015 because of increased confusion, was found to have UTI,she was given prescription of Augmentin for 5 days, there was no significant change in her confusion  February 09 2015, EEG recording asymmetric slowing of the left hemisphere, with occasionally sharp transient  Patient's continue have slow worsening confusion, agitation, paranoid ideation, difficulty sleeping at nighttime, She had 10 years of education, worked two jobs most of her life, she retired at age 7, was very active until she had stroke in 2013, was noted to have declining functional status, increased memory trouble, especially since January 2016,  She also has worsening gait difficulty,worsening right knee pain, had a recent evaluation by orthopedic surgeon,she is not a candidate for right knee replacement anymore,because of worsening memory trouble, gradually increased confusion  She also complains of low back pain,worsening bowel and bladder incontinence Update May 11 2015: Her confusion continues with getting worse, she is more agitated, could not well at night time, paranoid, scared, seroquel has been helpful, she is now taking 25 mg 1 in the morning, 1 at 2:00, 2 tablets at night  She is on polypharmacy  treatment including Paxil, BuSpar, Ativan as needed, She has no recurrent seizure, taking Keppra 500 mg/1000 mg  UPDATE Aug 11 2015: She is now taking seroquel '25mg'$  3 tabs  qhs,ativan prn, but she still has trouble sleeping. She is seeing psychiatrist, taking BuSpar, she has been dealing with depression for a long time. She was previously treated as SSRI, which has been helpful  She has no recurrent seizure, taking Keppra 500/1000 mg daily  UPDATE 09/21/15:Martha Olsen is a 84 year old female with a history of memory disorder and insomnia. She returns today for an evaluation. The patient did follow up with a psychiatrist. She was taken off of Seroquel. The patient states that she continues to have depression-- she has remained on Zoloft 50 mg daily. The patient has a hard time sleeping at night. However the Seroquel did not give her much benefit either. She was on Aricept for her memory but this caused itching and therefore the medication was discontinued by her psychiatrist. The patient is able to complete most ADLs independently. She lives at home with her husband. The patient is unable to get out of the house much because she does not drive and her husband is not able. She does feel that this may contribute to her depression. She denies any new neurological symptoms. She returns today for an evaluation.  UPDATE Jan 10th 2018: She is with her husband at today's clinical visit, she has been doing very well over the past 6 months, tolerating current medications, does not want to make any changes, no recurrent seizure, on Keppra 500/1000  UPDATE November 14 2019: She is with her daughter at today's clinical visit, since her husband passed away 3 years ago, she has moved in with her daughter, overall she is stable, was noted to have increased gait abnormality, rely on her walker for short distance at home, recently received physical therapy, she is happy about her current medications, no recurrent seizure, is calm most of the time, sleeps well, has good appetite,   REVIEW OF SYSTEMS: Out of a complete 14 system review of symptoms, the patient complains only of the  following symptoms, and all other reviewed systems are negative.  Memory loss, seizures   ALLERGIES: Allergies  Allergen Reactions  . Codeine Hives    HOME MEDICATIONS: Outpatient Medications Prior to Visit  Medication Sig Dispense Refill  . ARIPiprazole (ABILIFY) 5 MG tablet     . aspirin 81 MG chewable tablet Chew 1 tablet (81 mg total) by mouth daily. 30 tablet 2  . atorvastatin (LIPITOR) 40 MG tablet Take 40 mg by mouth daily.    . carvedilol (COREG) 6.25 MG tablet Take 1 tablet (6.25 mg total) by mouth 2 (two) times daily with a meal. 180 tablet 3  . donepezil (ARICEPT) 10 MG tablet Take 1 tablet (10 mg total) by mouth at bedtime. 90 tablet 1  . gabapentin (NEURONTIN) 300 MG capsule Take 600 mg by mouth 2 (two) times daily.    Marland Kitchen glimepiride (AMARYL) 2 MG tablet Take 2 mg by mouth daily before breakfast.    . levETIRAcetam (KEPPRA) 500 MG tablet 1 tablet every morning, 2 tablets every night 270 tablet 3  . lisinopril-hydrochlorothiazide (PRINZIDE,ZESTORETIC) 20-12.5 MG per tablet Take 1 tablet by mouth daily.    Marland Kitchen LORazepam (ATIVAN) 2 MG tablet Take 2 mg by mouth at bedtime.     . memantine (NAMENDA) 10 MG tablet TAKE 1 TABLET BY MOUTH TWICE DAILY 180 tablet 3  . metFORMIN (GLUCOPHAGE)  1000 MG tablet Take 1,000 mg by mouth daily with breakfast.   1  . mirtazapine (REMERON) 15 MG tablet Take 15 mg by mouth at bedtime.    . nitroGLYCERIN (NITROSTAT) 0.4 MG SL tablet Place 1 tablet (0.4 mg total) under the tongue every 5 (five) minutes as needed for chest pain. 25 tablet 3  . ranitidine (ZANTAC) 150 MG tablet Take 150 mg by mouth 2 (two) times daily as needed for heartburn.    . sertraline (ZOLOFT) 50 MG tablet TAKE 1&1/2 TABLETS BY MOUTH DAILY 135 tablet 1  . trimethoprim (TRIMPEX) 100 MG tablet Take 100 mg by mouth daily.    Marland Kitchen VIIBRYD 40 MG TABS     . zaleplon (SONATA) 10 MG capsule     . busPIRone (BUSPAR) 15 MG tablet Take 15 mg by mouth 3 (three) times daily.    . cephALEXin  (KEFLEX) 500 MG capsule Take 1 capsule (500 mg total) by mouth 4 (four) times daily. 28 capsule 0  . repaglinide (PRANDIN) 0.5 MG tablet      No facility-administered medications prior to visit.    PAST MEDICAL HISTORY: Past Medical History:  Diagnosis Date  . Anxiety   . Dementia ([place])   . Depression   . Diabetes mellitus 2011  . Headache(784.0)   . History of echocardiogram    Echo 5/16: EF 55-60%, normal wall motion, grade 1 diastolic dysfunction  . Hypertension   . Seizure ([place])   . Stroke [facility]) 2002   right side weakness  . UTI (urinary tract infection) 02/2015    PAST SURGICAL HISTORY: Past Surgical History:  Procedure Laterality Date  . ABDOMINAL HYSTERECTOMY  1980's  . APPENDECTOMY  1980's  . CHOLECYSTECTOMY  1993  . EYE SURGERY  2009  . FOOT SURGERY    . JOINT REPLACEMENT  2012   Hip, Knee  . REVERSE SHOULDER ARTHROPLASTY  12/07/2011   Procedure: REVERSE SHOULDER ARTHROPLASTY;  Surgeon: Marin Shutter, MD;  Location: [place];  Service: Orthopedics;  Laterality: Left;  left total reverse shoulder  . TONSILLECTOMY  1951    FAMILY HISTORY: Family History  Problem Relation Age of Onset  . Stroke Mother 51  . Hypertension Mother   . CAD Father 70  . Heart attack Father   . Anesthesia problems Neg Hx     SOCIAL HISTORY: Social History   Socioeconomic History  . Marital status: Married    Spouse name: Denyse Amass  . Number of children: 4  . Years of education: 9th  . Highest education level: Not on file  Occupational History  . Occupation: Chartered certified accountant    Comment: Retired  Tobacco Use  . Smoking status: Never Smoker  . Smokeless tobacco: Never Used  Substance and Sexual Activity  . Alcohol use: No    Alcohol/week: 0.0 standard drinks  . Drug use: No  . Sexual activity: Not on file  Other Topics Concern  . Not on file  Social History Narrative   Son recently died of cancer.  Lives at home with husband Thomes Cake) .  She has 3 children .    Education 10 th  grade.   Right handed.   Caffeine use: 1 cup coffee/ day         Social Determinants of Health   Financial Resource Strain:   . Difficulty of Paying Living Expenses:   Food Insecurity:   . Worried About Charity fundraiser in the Last Year:   . [employer]  of Food in the Last Year:   Transportation Needs:   . Film/video editor (Medical):   Marland Kitchen Lack of Transportation (Non-Medical):   Physical Activity:   . Days of Exercise per Week:   . Minutes of Exercise per Session:   Stress:   . Feeling of Stress :   Social Connections:   . Frequency of Communication with Friends and Family:   . Frequency of Social Gatherings with Friends and Family:   . Attends Religious Services:   . Active Member of Clubs or Organizations:   . Attends Archivist Meetings:   Marland Kitchen Marital Status:   Intimate Partner Violence:   . Fear of Current or Ex-Partner:   . Emotionally Abused:   Marland Kitchen Physically Abused:   . Sexually Abused:     PHYSICAL EXAM  Vitals:   11/13/19 1538  BP: (!) 123/58  Pulse: 72  Temp: 97.6 F (36.4 C)   There is no height or weight on file to calculate BMI.  Generalized: Well developed, in no acute distress  MMSE - Mini Mental State Exam 11/13/2019 03/25/2019 09/19/2018  Not completed: - - (No Data)  Orientation to time '5 4 5  '$ Orientation to Place '5 5 2  '$ Registration '3 3 3  '$ Attention/ Calculation 3 5 0  Recall '3 3 3  '$ Language- name 2 objects '2 2 2  '$ Language- repeat 1 1 0  Language- follow 3 step command '3 2 3  '$ Language- read & follow direction '1 1 1  '$ Write a sentence '1 1 1  '$ Copy design 0 0 0  Copy design-comments - named 5 animals -  Total score '27 27 20    '$ Neurological examination  Mentation: Alert oriented to time, place, history taking. Follows all commands speech and language fluent Cranial nerve II-XII: Pupils were equal round reactive to light. Extraocular movements were full, visual field were full on confrontational test. Facial sensation and strength  were normal.  Head turning and shoulder shrug  were normal and symmetric. Motor: Normal strength, mild rigidity, bradykinesia, most noticeable during foot tapping more on the left side Sensory:intact to light touch, vibratory sensation Reflexes: hypoactive and symmetric bilaterally Coordination: no trunk and limb dysmetria Gait and station: In a wheelchair, need assistant to getup, severe bilateral knee valgrus, difficulty initiate gait, unsteady   DIAGNOSTIC DATA (LABS, IMAGING, TESTING) - I reviewed patient records, labs, notes, testing and imaging myself where available.  Lab Results  Component Value Date   WBC 9.6 06/17/2019   HGB 13.3 06/17/2019   HCT 42.7 06/17/2019   MCV 101.7 (H) 06/17/2019   PLT 227 06/17/2019      Component Value Date/Time   NA 141 06/17/2019 1135   NA 142 02/09/2015 1021   K 4.8 06/17/2019 1135   CL 101 06/17/2019 1135   CO2 24 06/17/2019 1135   GLUCOSE 91 06/17/2019 1135   BUN 18 06/17/2019 1135   BUN 17 02/09/2015 1021   CREATININE 1.35 (H) 06/17/2019 1135   CALCIUM 9.7 06/17/2019 1135   PROT 7.2 08/29/2018 1533   PROT 6.4 02/09/2015 1021   ALBUMIN 3.9 08/29/2018 1533   ALBUMIN 4.3 02/09/2015 1021   AST 29 08/29/2018 1533   ALT 25 08/29/2018 1533   ALKPHOS 61 08/29/2018 1533   BILITOT 0.6 08/29/2018 1533   BILITOT 0.9 02/09/2015 1021   GFRNONAA 37 (L) 06/17/2019 1135   GFRAA 43 (L) 06/17/2019 1135   Lab Results  Component Value Date   CHOL 117  10/14/2012   HDL 42 10/14/2012   LDLCALC 40 10/14/2012   TRIG 175 (H) 10/14/2012   CHOLHDL 2.8 10/14/2012   Lab Results  Component Value Date   HGBA1C 7.0 (H) 10/12/2012   No results found for: VITAMINB12 Lab Results  Component Value Date   TSH 2.030 07/14/2014      ASSESSMENT AND PLAN 84 y.o. year old female    Mild cognitive impairment   Aricept and Namenda was stopped, there was no difficult change, to simplify her medication regimen, we will not restart Aricept and Namenda at  this point  Seizures  She has not had recurrent seizure  Continue Keppra 500 mg in the morning, 1000 mg in the evening  Gait abnormality  Multifactorial, deconditioning, aging, bilateral knee pain, deformity,  There was mild parkinsonian features on today's examination, will give her a trial of Sinemet 25/100 mg 3 times a day  Home physical therapy  Marcial Pacas, M.D. Ph.D.  [facility] [place], [place] [address] Phone: [phone] Fax:      [phone]

## 2019-11-14 ENCOUNTER — Encounter: Payer: Self-pay | Admitting: Neurology

## 2019-11-14 MED ORDER — LEVETIRACETAM 500 MG PO TABS: ORAL_TABLET | ORAL | 4 refills | Status: DC

## 2019-11-14 MED ORDER — CARBIDOPA-LEVODOPA 25-100 MG PO TABS: 1.0000 | ORAL_TABLET | Freq: Three times a day (TID) | ORAL | 11 refills | Status: DC

## 2019-11-24 ENCOUNTER — Telehealth: Payer: Self-pay | Admitting: Neurology

## 2019-11-24 NOTE — Telephone Encounter (Signed)
I called pt's daughter, Lelon Frohlich, per [facility]. I advised her to check with the [facility] agency regarding insurance coverage for therapy. Lelon Frohlich reports that they will be in touch with the agency this PM. She reports that pt did have recent PT and she is concerned that insurance will not cover another round.

## 2019-11-24 NOTE — Telephone Encounter (Signed)
Pt's daughter Lelon Frohlich on [place] called wanting to discuss the pt's PT with the RN. Please advise.

## 2019-11-24 NOTE — Telephone Encounter (Signed)
I called pt's daughter, Lelon Frohlich, per [facility]. No answer, left a message asking her to call me back. It appears that Hinton Dyer is waiting to see if [facility] Means can accept this pt (note on 11/18/19 by Hinton Dyer). Please inquire as to what the question is regarding PT.

## 2019-11-24 NOTE — Telephone Encounter (Signed)
Pt's daughter called back stating that the pt has just stopped receiving therapy about 3 weeks ago and she is not sure if the insurance will pay for more and is needing to discuss this with RN. Please call back when available.

## 2019-11-25 NOTE — Telephone Encounter (Signed)
States pt was seen by another agency for about 10 weeks and was just discharged 3 weeks ago. States insurance will not cover for PT visits as nothing much has changed. States pt can review PT orders at next visit as insurance could possibly cover then.   Lennette Bihari [facility] cb[phone]

## 2019-12-02 NOTE — Telephone Encounter (Signed)
Called Lennette Bihari back and left him a message to call me back . Thanks Hinton Dyer

## 2019-12-03 NOTE — Telephone Encounter (Signed)
Patient has already did 10 weeks of Home PT . Patient's insurance is not going to let her pick back up to July . Patient's sister states she will have to wait because they can't afford at this time. Thanks Hinton Dyer

## 2019-12-05 DIAGNOSIS — M150 Primary generalized (osteo)arthritis: Secondary | ICD-10-CM | POA: Diagnosis not present

## 2019-12-05 DIAGNOSIS — M549 Dorsalgia, unspecified: Secondary | ICD-10-CM | POA: Diagnosis not present

## 2019-12-05 DIAGNOSIS — E1142 Type 2 diabetes mellitus with diabetic polyneuropathy: Secondary | ICD-10-CM | POA: Diagnosis not present

## 2019-12-10 DIAGNOSIS — F0280 Dementia in other diseases classified elsewhere without behavioral disturbance: Secondary | ICD-10-CM | POA: Diagnosis not present

## 2019-12-10 DIAGNOSIS — F5101 Primary insomnia: Secondary | ICD-10-CM | POA: Diagnosis not present

## 2019-12-10 DIAGNOSIS — F411 Generalized anxiety disorder: Secondary | ICD-10-CM | POA: Diagnosis not present

## 2019-12-10 DIAGNOSIS — F331 Major depressive disorder, recurrent, moderate: Secondary | ICD-10-CM | POA: Diagnosis not present

## 2020-01-05 DIAGNOSIS — M549 Dorsalgia, unspecified: Secondary | ICD-10-CM | POA: Diagnosis not present

## 2020-01-05 DIAGNOSIS — M150 Primary generalized (osteo)arthritis: Secondary | ICD-10-CM | POA: Diagnosis not present

## 2020-01-05 DIAGNOSIS — E1142 Type 2 diabetes mellitus with diabetic polyneuropathy: Secondary | ICD-10-CM | POA: Diagnosis not present

## 2020-01-23 DIAGNOSIS — G9389 Other specified disorders of brain: Secondary | ICD-10-CM | POA: Diagnosis not present

## 2020-01-23 DIAGNOSIS — I129 Hypertensive chronic kidney disease with stage 1 through stage 4 chronic kidney disease, or unspecified chronic kidney disease: Secondary | ICD-10-CM | POA: Diagnosis not present

## 2020-01-23 DIAGNOSIS — G40909 Epilepsy, unspecified, not intractable, without status epilepticus: Secondary | ICD-10-CM | POA: Diagnosis not present

## 2020-01-23 DIAGNOSIS — E1122 Type 2 diabetes mellitus with diabetic chronic kidney disease: Secondary | ICD-10-CM | POA: Diagnosis not present

## 2020-01-23 DIAGNOSIS — F0390 Unspecified dementia without behavioral disturbance: Secondary | ICD-10-CM | POA: Diagnosis not present

## 2020-01-23 DIAGNOSIS — E119 Type 2 diabetes mellitus without complications: Secondary | ICD-10-CM | POA: Diagnosis not present

## 2020-01-23 DIAGNOSIS — Z96611 Presence of right artificial shoulder joint: Secondary | ICD-10-CM | POA: Diagnosis not present

## 2020-01-23 DIAGNOSIS — N1832 Chronic kidney disease, stage 3b: Secondary | ICD-10-CM | POA: Diagnosis not present

## 2020-01-23 DIAGNOSIS — I639 Cerebral infarction, unspecified: Secondary | ICD-10-CM | POA: Diagnosis not present

## 2020-01-23 DIAGNOSIS — E785 Hyperlipidemia, unspecified: Secondary | ICD-10-CM | POA: Diagnosis not present

## 2020-01-23 DIAGNOSIS — E669 Obesity, unspecified: Secondary | ICD-10-CM | POA: Diagnosis not present

## 2020-01-23 DIAGNOSIS — E041 Nontoxic single thyroid nodule: Secondary | ICD-10-CM | POA: Diagnosis not present

## 2020-01-23 DIAGNOSIS — F329 Major depressive disorder, single episode, unspecified: Secondary | ICD-10-CM | POA: Diagnosis not present

## 2020-01-23 DIAGNOSIS — Z96651 Presence of right artificial knee joint: Secondary | ICD-10-CM | POA: Diagnosis not present

## 2020-01-23 DIAGNOSIS — I69351 Hemiplegia and hemiparesis following cerebral infarction affecting right dominant side: Secondary | ICD-10-CM | POA: Diagnosis not present

## 2020-01-23 DIAGNOSIS — R29810 Facial weakness: Secondary | ICD-10-CM | POA: Diagnosis not present

## 2020-01-23 DIAGNOSIS — I6522 Occlusion and stenosis of left carotid artery: Secondary | ICD-10-CM | POA: Diagnosis not present

## 2020-01-23 DIAGNOSIS — I63233 Cerebral infarction due to unspecified occlusion or stenosis of bilateral carotid arteries: Secondary | ICD-10-CM | POA: Diagnosis not present

## 2020-01-23 DIAGNOSIS — Z6834 Body mass index (BMI) 34.0-34.9, adult: Secondary | ICD-10-CM | POA: Diagnosis not present

## 2020-01-23 DIAGNOSIS — Z96641 Presence of right artificial hip joint: Secondary | ICD-10-CM | POA: Diagnosis not present

## 2020-01-24 DIAGNOSIS — I129 Hypertensive chronic kidney disease with stage 1 through stage 4 chronic kidney disease, or unspecified chronic kidney disease: Secondary | ICD-10-CM | POA: Diagnosis not present

## 2020-01-24 DIAGNOSIS — I6522 Occlusion and stenosis of left carotid artery: Secondary | ICD-10-CM | POA: Diagnosis not present

## 2020-01-24 DIAGNOSIS — R29810 Facial weakness: Secondary | ICD-10-CM | POA: Diagnosis not present

## 2020-01-24 DIAGNOSIS — I69351 Hemiplegia and hemiparesis following cerebral infarction affecting right dominant side: Secondary | ICD-10-CM | POA: Diagnosis not present

## 2020-01-26 ENCOUNTER — Ambulatory Visit: Payer: Medicare Other | Admitting: Neurology

## 2020-01-26 ENCOUNTER — Encounter: Payer: Self-pay | Admitting: Neurology

## 2020-01-26 ENCOUNTER — Other Ambulatory Visit: Payer: Self-pay

## 2020-01-26 ENCOUNTER — Telehealth: Payer: Self-pay | Admitting: Neurology

## 2020-01-26 VITALS — BP 138/60 | HR 76 | Ht 63.0 in | Wt 186.0 lb

## 2020-01-26 DIAGNOSIS — F0391 Unspecified dementia with behavioral disturbance: Secondary | ICD-10-CM

## 2020-01-26 DIAGNOSIS — R269 Unspecified abnormalities of gait and mobility: Secondary | ICD-10-CM | POA: Diagnosis not present

## 2020-01-26 DIAGNOSIS — G40909 Epilepsy, unspecified, not intractable, without status epilepticus: Secondary | ICD-10-CM

## 2020-01-26 DIAGNOSIS — F03918 Unspecified dementia, unspecified severity, with other behavioral disturbance: Secondary | ICD-10-CM | POA: Insufficient documentation

## 2020-01-26 MED ORDER — LEVETIRACETAM 500 MG PO TABS: 1000.0000 mg | ORAL_TABLET | Freq: Two times a day (BID) | ORAL | 4 refills | Status: DC

## 2020-01-26 NOTE — Telephone Encounter (Signed)
Opening was available today 7/12 at 2:30 pm. Was able to have them worked in sooner. They were appreciative.

## 2020-01-26 NOTE — Progress Notes (Signed)
HISTORY OF PRESENT ILLNESS: Martha Olsen, mauch in refer by her primary care physician Dr. Donavan Burnet for evaluation of memory loss.    She has past medical history of HTN, s/p CVA right basal ganglia 11/2001, dyslipidemia, DM, anxiety/depression, s/p appendectomy, s/p hysterectomy, DJD, s/p left hip replacement 03/2002, left knee replacement 01/2011, foot surgery and reverse left shoulder arthroplasty 11/2011.  She had 9 years of education, she was a Chartered certified accountant, she worked at [employer], [employer], retried at age [place] care of her grandchildren. She denies family history of memory loss. Mother died at age 17 from stroke.  I saw her initially following her hospital discharge in March 2013,  In October 13 2011, she was at home, sitting at the rocking chair, was noted by her family fell over the chair, had generalized tonic-clonic seizure, patient has no warning signs, no collection of the event, EMS was called, she was taken by ambulance to the hospital, she could not remember waking up in the hospital confused, she was put on Keppra 500 mg twice a day, tolerating it well, there was no recurrent seizure.  EEG was normal. MRI of the brainin 2013has demonstrated old basal ganglion, and coronal radiata stroke, no acute lesions,  She complains of headache everyday, right temporal regions, light noise is bothersome,   She has quit driving since 2094, husband retired, she is active at home, house work  She has no recurrent seizures, tolerating Keppra 500 mg twice a day without significant side effect, she has increased gait difficulty because of joints, knee pain, She has been complaining of a year history of worsening headaches, bilateral frontal, vortex region, she has to take ibuprofen 200 mg 2-3 tablets each time, sometimes two dosage each day  She was given prescription of tramadol 50 mg as neededinDecember 2015, her headache has much improved, taking tramadol about twice  each week, she continue has bilateral knee pain, low back pain, gait difficulty, she also has nocturia, frequent awakening, loss snoring, excessive daytime sleepiness, fatigue, sleep studies pending in August 25 2014  ESR, TSH was normal December 2015, mild elevated C-reactive protein 11.  UPDATE February 18 2015: She is accompanied by her husband, and daughter at today's clinical visit,  She had a urgent visit with Dr. Jaynee Eagles in February 09 2015 because of increased confusion, was found to have UTI,she was given prescription of Augmentin for 5 days, there was no significant change in her confusion  February 09 2015, EEG recording asymmetric slowing of the left hemisphere, with occasionally sharp transient  Patient's continue have slow worsening confusion, agitation, paranoid ideation, difficulty sleeping at nighttime, She had 10 years of education, worked two jobs most of her life, she retired at age 64, was very active until she had stroke in 2013, was noted to have declining functional status, increased memory trouble, especially since January 2016,  She also has worsening gait difficulty,worsening right knee pain, had a recent evaluation by orthopedic surgeon,she is not a candidate for right knee replacement anymore,because of worsening memory trouble, gradually increased confusion  She also complains of low back pain,worsening bowel and bladder incontinence Update May 11 2015: Her confusion continues with getting worse, she is more agitated, could not well at night time, paranoid, scared, seroquel has been helpful, she is now taking 25 mg 1 in the morning, 1 at 2:00, 2 tablets at night  She is on polypharmacy treatment including Paxil, BuSpar, Ativan as needed, She has no recurrent seizure, taking Keppra  500 mg/1000 mg  UPDATE Aug 11 2015: She is now taking seroquel 4m 3 tabs qhs,ativan prn, but she still has trouble sleeping. She is seeing psychiatrist, taking BuSpar, she has  been dealing with depression for a long time. She was previously treated as SSRI, which has been helpful  She has no recurrent seizure, taking Keppra 500/1000 mg daily   UPDATE Jan 10th 2018: She is with her husband at today's clinical visit, she has been doing very well over the past 6 months, tolerating current medications, does not want to make any changes, no recurrent seizure, on Keppra 500/1000  UPDATE November 14 2019: She is with her daughter at today's clinical visit, since her husband passed away 3 years ago, she has moved in with her daughter, overall she is stable, was noted to have increased gait abnormality, rely on her walker for short distance at home, recently received physical therapy, she is happy about her current medications, no recurrent seizure, is calm most of the time, sleeps well, has good appetite,  UPDATE January 26 2020: This is an early than expected visit, she and her family went on vacation on January 22, 2019, next morning she had a recurrent seizure, weakness by her family, after a bite of hamburger, she was noted to have body shaking, foaming, out of her mouth, lasting for few minutes,  [facility] reviewed hospital discharge on January 24, 2020, CT of the head without contrast no acute abnormality, MRI of the brain without contrast, no evidence of acute infarction, supratentorium small vessel disease, chronic lacunar infarction at the right basal ganglion, mild generalized atrophy Ultrasound of carotid artery, incidental finding of left thyroid nodule measuring 1.7 x 1.1 x 1.8 cm, left internal carotid artery 50 to 69% stenosis, no significant right internal carotid artery stenosis, bilateral vertebral artery with antegrade flow  Echocardiogram technically difficult study, no significant abnormality, ejection fraction 60 to 65%  EKG, sinus rhythm with premature atrial complex,  Keppra was increased from 500/1000milligram  to 1000 mg twice a day, she tolerated the medication well, but does noted to have excessive fatigue, sleepiness, increased gait abnormality, she was started on sentiment at last visit in April 2021, she tolerated the medication well, mildly mild improvement in her gait, both patient [facility] her [facility] older son lives in the [facility] facility, paperwork was completed Address: [address] [place]  [id number]: [phone] REVIEW OF SYSTEMS: Out of a complete 14 system review of symptoms, the patient complains only of the following symptoms, and all other reviewed systems are negative.   ALLERGIES: Allergies  Allergen Reactions  . Codeine Hives    HOME MEDICATIONS: Outpatient Medications Prior to Visit  Medication Sig Dispense Refill  . ARIPiprazole (ABILIFY) 5 MG tablet     . aspirin 81 MG chewable tablet Chew 1 tablet (81 mg total) by mouth daily. 30 tablet 2  . atorvastatin (LIPITOR) 40 MG tablet Take 40 mg by mouth daily.    . carbidopa-levodopa (SINEMET IR) 25-100 MG tablet Take 1 tablet by mouth 3 (three) times daily. 90 tablet 11  . carvedilol (COREG) 6.25 MG tablet Take 1 tablet (6.25 mg total) by mouth 2 (two) times daily with a meal. (Patient taking differently: Take 6.25 mg by mouth in the morning, at noon, and at bedtime. ) 180 tablet 3  . donepezil (ARICEPT)  10 MG tablet Take 10 mg by mouth daily.    Marland Kitchen gabapentin (NEURONTIN) 300 MG capsule Take 600 mg by mouth 2 (two) times daily.    Marland Kitchen glimepiride (AMARYL) 2 MG tablet Take 2 mg by mouth daily before breakfast.    . levETIRAcetam (KEPPRA) 500 MG tablet 1 tablet every morning, 2 tablets every night (Patient taking differently: Take 1,000 mg by mouth 2 (two) times daily. 1 tablet every morning, 2 tablets every night) 270 tablet 4  . lisinopril-hydrochlorothiazide (PRINZIDE,ZESTORETIC) 20-12.5 MG per tablet Take 1 tablet by mouth daily.    Marland Kitchen LORazepam  (ATIVAN) 1 MG tablet Take 1 mg by mouth daily as needed.    . memantine (NAMENDA) 10 MG tablet Take 10 mg by mouth 2 (two) times daily.    . metFORMIN (GLUCOPHAGE) 1000 MG tablet Take 1,000 mg by mouth daily with breakfast.   1  . mirtazapine (REMERON) 15 MG tablet Take 15 mg by mouth at bedtime.    . nitroGLYCERIN (NITROSTAT) 0.4 MG SL tablet Place 1 tablet (0.4 mg total) under the tongue every 5 (five) minutes as needed for chest pain. 25 tablet 3  . repaglinide (PRANDIN) 0.5 MG tablet Take 0.5 mg by mouth 3 (three) times daily.    . sertraline (ZOLOFT) 50 MG tablet TAKE 1&1/2 TABLETS BY MOUTH DAILY (Patient taking differently: Take 50 mg by mouth daily. ) 135 tablet 1  . trimethoprim (TRIMPEX) 100 MG tablet Take 100 mg by mouth 2 (two) times daily.     Marland Kitchen VIIBRYD 40 MG TABS     . zaleplon (SONATA) 10 MG capsule     . LORazepam (ATIVAN) 2 MG tablet Take 2 mg by mouth at bedtime.     . ranitidine (ZANTAC) 150 MG tablet Take 150 mg by mouth 2 (two) times daily as needed for heartburn.     No facility-administered medications prior to visit.    PAST MEDICAL HISTORY: Past Medical History:  Diagnosis Date  . Anxiety   . Dementia ([place])   . Depression   . Diabetes mellitus 2011  . Headache(784.0)   . History of echocardiogram    Echo 5/16: EF 55-60%, normal wall motion, grade 1 diastolic dysfunction  . Hypertension   . Seizure ([place])   . Stroke [facility]) 2002   right side weakness  . UTI (urinary tract infection) 02/2015    PAST SURGICAL HISTORY: Past Surgical History:  Procedure Laterality Date  . ABDOMINAL HYSTERECTOMY  1980's  . APPENDECTOMY  1980's  . CHOLECYSTECTOMY  1993  . EYE SURGERY  2009  . FOOT SURGERY    . JOINT REPLACEMENT  2012   Hip, Knee  . REVERSE SHOULDER ARTHROPLASTY  12/07/2011   Procedure: REVERSE SHOULDER ARTHROPLASTY;  Surgeon: Marin Shutter, MD;  Location: [facility];  Service: Orthopedics;  Laterality: Left;  left total reverse shoulder  . TONSILLECTOMY  1951     FAMILY HISTORY: Family History  Problem Relation Age of Onset  . Stroke Mother 71  . Hypertension Mother   . CAD Father 12  . Heart attack Father   . Anesthesia problems Neg Hx     SOCIAL HISTORY: Social History   Socioeconomic History  . Marital status: Married    Spouse name: Denyse Amass  . Number of children: 4  . Years of education: 9th  . Highest education level: Not on file  Occupational History  . Occupation: Chartered certified accountant    Comment: Retired  Tobacco Use  . Smoking  status: Never Smoker  . Smokeless tobacco: Never Used  Substance and Sexual Activity  . Alcohol use: No    Alcohol/week: 0.0 standard drinks  . Drug use: No  . Sexual activity: Not on file  Other Topics Concern  . Not on file  Social History Narrative   Son recently died of cancer.  Lives at home with husband Thomes Cake) .  She has 3 children .    Education 10 th grade.   Right handed.   Caffeine use: 1 cup coffee/ day         Social Determinants of Health   Financial Resource Strain:   . Difficulty of Paying Living Expenses:   Food Insecurity:   . Worried About Charity fundraiser in the Last Year:   . Arboriculturist in the Last Year:   Transportation Needs:   . Film/video editor (Medical):   Marland Kitchen Lack of Transportation (Non-Medical):   Physical Activity:   . Days of Exercise per Week:   . Minutes of Exercise per Session:   Stress:   . Feeling of Stress :   Social Connections:   . Frequency of Communication with Friends and Family:   . Frequency of Social Gatherings with Friends and Family:   . Attends Religious Services:   . Active Member of Clubs or Organizations:   . Attends Archivist Meetings:   Marland Kitchen Marital Status:   Intimate Partner Violence:   . Fear of Current or Ex-Partner:   . Emotionally Abused:   Marland Kitchen Physically Abused:   . Sexually Abused:     PHYSICAL EXAM  Vitals:   01/26/20 1416  BP: 138/60  Pulse: 76  Weight: 186 lb (84.4 kg)  Height: _0  (1.6 m)    Body mass index is 32.95 kg/m.  Generalized: Well developed, in no acute distress  MMSE - Mini Mental State Exam 11/13/2019 03/25/2019 09/19/2018  Not completed: - - (No Data)  Orientation to time _1 Orientation to Place _2 Registration _3 Attention/ Calculation 3 5 0  Recall _4 Language- name 2 objects _5 Language- repeat 1 1 0  Language- follow 3 step command _6 Language- read & follow direction _7 Write a sentence _8 Copy design 0 0 0  Copy design-comments - named 5 animals -  Total score _9 Neurological examination  Mentation: Alert oriented to time, place, history taking. Follows all commands speech and language fluent Cranial nerve II-XII: Pupils were equal round reactive to light. Extraocular movements were full, visual field were full on confrontational test. Facial sensation and strength were normal.  Head turning and shoulder shrug  were normal and symmetric. Motor: Normal strength, mild rigidity, bradykinesia, most noticeable during foot tapping more on the left side Sensory:intact to light touch, vibratory sensation Reflexes: hypoactive and symmetric bilaterally Coordination: no trunk and limb dysmetria Gait and station: In a wheelchair, need assistant to getup, severe bilateral knee valgrus, difficulty initiate gait, unsteady   DIAGNOSTIC DATA (LABS, IMAGING, TESTING) - I reviewed patient records, labs, notes, testing and imaging myself where available.  Lab Results  Component Value Date   WBC 9.6 06/17/2019   HGB 13.3 06/17/2019   HCT 42.7 06/17/2019   MCV 101.7 (H) 06/17/2019   PLT 227 06/17/2019      Component Value Date/Time   NA  141 06/17/2019 1135   NA 142 02/09/2015 1021   K 4.8 06/17/2019 1135   CL 101 06/17/2019 1135   CO2 24 06/17/2019 1135   GLUCOSE 91 06/17/2019 1135   BUN 18 06/17/2019 1135   BUN 17 02/09/2015 1021   CREATININE 1.35 (H) 06/17/2019 1135   CALCIUM 9.7 06/17/2019 1135   PROT 7.2  08/29/2018 1533   PROT 6.4 02/09/2015 1021   ALBUMIN 3.9 08/29/2018 1533   ALBUMIN 4.3 02/09/2015 1021   AST 29 08/29/2018 1533   ALT 25 08/29/2018 1533   ALKPHOS 61 08/29/2018 1533   BILITOT 0.6 08/29/2018 1533   BILITOT 0.9 02/09/2015 1021   GFRNONAA 37 (L) 06/17/2019 1135   GFRAA 43 (L) 06/17/2019 1135   Lab Results  Component Value Date   CHOL 117 10/14/2012   HDL 42 10/14/2012   LDLCALC 40 10/14/2012   TRIG 175 (H) 10/14/2012   CHOLHDL 2.8 10/14/2012   Lab Results  Component Value Date   HGBA1C 7.0 (H) 10/12/2012   No results found for: VITAMINB12 Lab Results  Component Value Date   TSH 2.030 07/14/2014      ASSESSMENT AND PLAN 84 y.o. year old female    Mild cognitive impairment   Aricept and Namenda was stopped to simplified her medication regime, there was no difference noted while taking the medication  Seizures  Most recent recurrent seizure was on January 23, 2020  She is now on a higher dose of Keppra 500 mg 2 tablets 2 times a day  Gait abnormality  Multifactorial, deconditioning, aging, bilateral knee pain, deformity,  Continue Sinemet 25/100 mg 3 times a day  She overall had a slow decline over the past few months, especially since her most recurrent seizure on January 23, 2020, have finished paperwork for rehab placement to [facility] for rehabilitation, her older son lives in the same facility, paperwork was completed Address: [address], [place], [place] [address] Phone: [phone]   Total time spent reviewing the chart, obtaining history, examined patient, ordering tests, documentation, consultations and family, care coordination was 40 minutes     Marcial Pacas, M.D. Ph.D.  [facility] [place], [place] [address] Phone: [phone] Fax:      [phone]

## 2020-01-26 NOTE — Telephone Encounter (Signed)
Pt was admitted to hosp due to a seizure. Pt would like to know if she can be seen sooner. NP and MD do not have anything sooner than scheduled appt. Please Arnoldo Hooker

## 2020-01-26 NOTE — Telephone Encounter (Signed)
Pt is scheduled 02/12/20 for follow up visit. As of this moment there is nothing sooner. I have kept patient on waitlist in case something opens up sooner.

## 2020-01-26 NOTE — Telephone Encounter (Signed)
Called the patient to advise

## 2020-01-27 ENCOUNTER — Other Ambulatory Visit: Payer: Self-pay | Admitting: Neurology

## 2020-01-27 MED ORDER — LEVETIRACETAM 500 MG PO TABS: 1000.0000 mg | ORAL_TABLET | Freq: Two times a day (BID) | ORAL | 4 refills | Status: DC

## 2020-02-12 ENCOUNTER — Ambulatory Visit: Payer: Medicare Other | Admitting: Neurology

## 2020-02-14 DIAGNOSIS — E119 Type 2 diabetes mellitus without complications: Secondary | ICD-10-CM | POA: Insufficient documentation

## 2020-02-14 DIAGNOSIS — Z7982 Long term (current) use of aspirin: Secondary | ICD-10-CM | POA: Insufficient documentation

## 2020-02-14 DIAGNOSIS — F419 Anxiety disorder, unspecified: Secondary | ICD-10-CM | POA: Insufficient documentation

## 2020-02-16 DIAGNOSIS — R41841 Cognitive communication deficit: Secondary | ICD-10-CM | POA: Insufficient documentation

## 2020-02-17 DIAGNOSIS — R269 Unspecified abnormalities of gait and mobility: Secondary | ICD-10-CM | POA: Diagnosis not present

## 2020-02-17 DIAGNOSIS — E119 Type 2 diabetes mellitus without complications: Secondary | ICD-10-CM | POA: Diagnosis not present

## 2020-02-17 DIAGNOSIS — G40909 Epilepsy, unspecified, not intractable, without status epilepticus: Secondary | ICD-10-CM | POA: Diagnosis not present

## 2020-02-17 DIAGNOSIS — I10 Essential (primary) hypertension: Secondary | ICD-10-CM | POA: Diagnosis not present

## 2020-02-19 DIAGNOSIS — I10 Essential (primary) hypertension: Secondary | ICD-10-CM | POA: Diagnosis not present

## 2020-02-19 DIAGNOSIS — G40909 Epilepsy, unspecified, not intractable, without status epilepticus: Secondary | ICD-10-CM | POA: Diagnosis not present

## 2020-02-23 DIAGNOSIS — R251 Tremor, unspecified: Secondary | ICD-10-CM | POA: Insufficient documentation

## 2020-02-23 DIAGNOSIS — I639 Cerebral infarction, unspecified: Secondary | ICD-10-CM | POA: Insufficient documentation

## 2020-02-24 DIAGNOSIS — R262 Difficulty in walking, not elsewhere classified: Secondary | ICD-10-CM | POA: Insufficient documentation

## 2020-03-03 DIAGNOSIS — E119 Type 2 diabetes mellitus without complications: Secondary | ICD-10-CM | POA: Diagnosis not present

## 2020-03-03 DIAGNOSIS — I10 Essential (primary) hypertension: Secondary | ICD-10-CM | POA: Diagnosis not present

## 2020-03-03 DIAGNOSIS — H9190 Unspecified hearing loss, unspecified ear: Secondary | ICD-10-CM | POA: Diagnosis not present

## 2020-03-12 DIAGNOSIS — G40909 Epilepsy, unspecified, not intractable, without status epilepticus: Secondary | ICD-10-CM | POA: Diagnosis not present

## 2020-03-12 DIAGNOSIS — E119 Type 2 diabetes mellitus without complications: Secondary | ICD-10-CM | POA: Diagnosis not present

## 2020-03-12 DIAGNOSIS — I10 Essential (primary) hypertension: Secondary | ICD-10-CM | POA: Diagnosis not present

## 2020-03-12 DIAGNOSIS — F0390 Unspecified dementia without behavioral disturbance: Secondary | ICD-10-CM | POA: Diagnosis not present

## 2020-03-16 ENCOUNTER — Encounter (HOSPITAL_COMMUNITY): Payer: Self-pay

## 2020-03-16 ENCOUNTER — Other Ambulatory Visit: Payer: Self-pay

## 2020-03-16 ENCOUNTER — Inpatient Hospital Stay (HOSPITAL_COMMUNITY)
Admission: EM | Admit: 2020-03-16 | Discharge: 2020-03-18 | DRG: 690 | Disposition: A | Discharge status: Home Health | Payer: Medicare Other | Attending: Internal Medicine | Admitting: Internal Medicine

## 2020-03-16 ENCOUNTER — Emergency Department (HOSPITAL_COMMUNITY): Payer: Medicare Other

## 2020-03-16 DIAGNOSIS — R531 Weakness: Secondary | ICD-10-CM

## 2020-03-16 DIAGNOSIS — Z20822 Contact with and (suspected) exposure to covid-19: Secondary | ICD-10-CM | POA: Diagnosis present

## 2020-03-16 DIAGNOSIS — E861 Hypovolemia: Secondary | ICD-10-CM | POA: Diagnosis present

## 2020-03-16 DIAGNOSIS — N3000 Acute cystitis without hematuria: Secondary | ICD-10-CM | POA: Diagnosis not present

## 2020-03-16 DIAGNOSIS — G20 Parkinson's disease: Secondary | ICD-10-CM | POA: Diagnosis present

## 2020-03-16 DIAGNOSIS — Z6831 Body mass index (BMI) 31.0-31.9, adult: Secondary | ICD-10-CM

## 2020-03-16 DIAGNOSIS — G9389 Other specified disorders of brain: Secondary | ICD-10-CM | POA: Diagnosis not present

## 2020-03-16 DIAGNOSIS — Z66 Do not resuscitate: Secondary | ICD-10-CM | POA: Diagnosis present

## 2020-03-16 DIAGNOSIS — E162 Hypoglycemia, unspecified: Secondary | ICD-10-CM | POA: Diagnosis not present

## 2020-03-16 DIAGNOSIS — Z7984 Long term (current) use of oral hypoglycemic drugs: Secondary | ICD-10-CM

## 2020-03-16 DIAGNOSIS — I129 Hypertensive chronic kidney disease with stage 1 through stage 4 chronic kidney disease, or unspecified chronic kidney disease: Secondary | ICD-10-CM | POA: Diagnosis present

## 2020-03-16 DIAGNOSIS — Z7982 Long term (current) use of aspirin: Secondary | ICD-10-CM

## 2020-03-16 DIAGNOSIS — N390 Urinary tract infection, site not specified: Secondary | ICD-10-CM | POA: Diagnosis present

## 2020-03-16 DIAGNOSIS — R5381 Other malaise: Secondary | ICD-10-CM | POA: Diagnosis present

## 2020-03-16 DIAGNOSIS — Z8673 Personal history of transient ischemic attack (TIA), and cerebral infarction without residual deficits: Secondary | ICD-10-CM

## 2020-03-16 DIAGNOSIS — I7090 Unspecified atherosclerosis: Secondary | ICD-10-CM | POA: Diagnosis not present

## 2020-03-16 DIAGNOSIS — E871 Hypo-osmolality and hyponatremia: Secondary | ICD-10-CM | POA: Diagnosis not present

## 2020-03-16 DIAGNOSIS — Z96612 Presence of left artificial shoulder joint: Secondary | ICD-10-CM | POA: Diagnosis present

## 2020-03-16 DIAGNOSIS — G40909 Epilepsy, unspecified, not intractable, without status epilepticus: Secondary | ICD-10-CM | POA: Diagnosis present

## 2020-03-16 DIAGNOSIS — R0602 Shortness of breath: Secondary | ICD-10-CM

## 2020-03-16 DIAGNOSIS — Z8249 Family history of ischemic heart disease and other diseases of the circulatory system: Secondary | ICD-10-CM

## 2020-03-16 DIAGNOSIS — I6529 Occlusion and stenosis of unspecified carotid artery: Secondary | ICD-10-CM | POA: Diagnosis not present

## 2020-03-16 DIAGNOSIS — E1122 Type 2 diabetes mellitus with diabetic chronic kidney disease: Secondary | ICD-10-CM | POA: Diagnosis present

## 2020-03-16 DIAGNOSIS — N218 Other lower urinary tract calculus: Secondary | ICD-10-CM | POA: Diagnosis not present

## 2020-03-16 DIAGNOSIS — I10 Essential (primary) hypertension: Secondary | ICD-10-CM | POA: Diagnosis not present

## 2020-03-16 DIAGNOSIS — Z79899 Other long term (current) drug therapy: Secondary | ICD-10-CM

## 2020-03-16 DIAGNOSIS — Z90710 Acquired absence of both cervix and uterus: Secondary | ICD-10-CM

## 2020-03-16 DIAGNOSIS — E669 Obesity, unspecified: Secondary | ICD-10-CM | POA: Diagnosis present

## 2020-03-16 DIAGNOSIS — H748X1 Other specified disorders of right middle ear and mastoid: Secondary | ICD-10-CM | POA: Diagnosis not present

## 2020-03-16 DIAGNOSIS — F0280 Dementia in other diseases classified elsewhere without behavioral disturbance: Secondary | ICD-10-CM | POA: Diagnosis present

## 2020-03-16 DIAGNOSIS — Z885 Allergy status to narcotic agent status: Secondary | ICD-10-CM

## 2020-03-16 DIAGNOSIS — N1831 Chronic kidney disease, stage 3a: Secondary | ICD-10-CM | POA: Diagnosis present

## 2020-03-16 DIAGNOSIS — Z9049 Acquired absence of other specified parts of digestive tract: Secondary | ICD-10-CM

## 2020-03-16 DIAGNOSIS — E161 Other hypoglycemia: Secondary | ICD-10-CM | POA: Diagnosis not present

## 2020-03-16 DIAGNOSIS — Z823 Family history of stroke: Secondary | ICD-10-CM

## 2020-03-16 LAB — DIFFERENTIAL
Abs Immature Granulocytes: 0.04 10*3/uL (ref 0.00–0.07)
Basophils Absolute: 0 10*3/uL (ref 0.0–0.1)
Basophils Relative: 0 %
Eosinophils Absolute: 0.2 10*3/uL (ref 0.0–0.5)
Eosinophils Relative: 2 %
Immature Granulocytes: 0 %
Lymphocytes Relative: 21 %
Lymphs Abs: 2.5 10*3/uL (ref 0.7–4.0)
Monocytes Absolute: 0.8 10*3/uL (ref 0.1–1.0)
Monocytes Relative: 7 %
Neutro Abs: 8.2 10*3/uL — ABNORMAL HIGH (ref 1.7–7.7)
Neutrophils Relative %: 70 %

## 2020-03-16 LAB — CBC
HCT: 38.2 % (ref 36.0–46.0)
Hemoglobin: 12.3 g/dL (ref 12.0–15.0)
MCH: 31.6 pg (ref 26.0–34.0)
MCHC: 32.2 g/dL (ref 30.0–36.0)
MCV: 98.2 fL (ref 80.0–100.0)
Platelets: 231 10*3/uL (ref 150–400)
RBC: 3.89 MIL/uL (ref 3.87–5.11)
RDW: 12.6 % (ref 11.5–15.5)
WBC: 11.8 10*3/uL — ABNORMAL HIGH (ref 4.0–10.5)
nRBC: 0 % (ref 0.0–0.2)

## 2020-03-16 LAB — URINALYSIS, ROUTINE W REFLEX MICROSCOPIC
Bilirubin Urine: NEGATIVE
Glucose, UA: NEGATIVE mg/dL
Hgb urine dipstick: NEGATIVE
Ketones, ur: NEGATIVE mg/dL
Nitrite: POSITIVE — AB
Protein, ur: NEGATIVE mg/dL
Specific Gravity, Urine: 1.006 (ref 1.005–1.030)
pH: 6 (ref 5.0–8.0)

## 2020-03-16 LAB — COMPREHENSIVE METABOLIC PANEL
ALT: 6 U/L (ref 0–44)
AST: 21 U/L (ref 15–41)
Albumin: 3.4 g/dL — ABNORMAL LOW (ref 3.5–5.0)
Alkaline Phosphatase: 78 U/L (ref 38–126)
Anion gap: 12 (ref 5–15)
BUN: 16 mg/dL (ref 8–23)
CO2: 22 mmol/L (ref 22–32)
Calcium: 8.5 mg/dL — ABNORMAL LOW (ref 8.9–10.3)
Chloride: 90 mmol/L — ABNORMAL LOW (ref 98–111)
Creatinine, Ser: 1.21 mg/dL — ABNORMAL HIGH (ref 0.44–1.00)
GFR calc Af Amer: 49 mL/min — ABNORMAL LOW (ref >60)
GFR calc non Af Amer: 42 mL/min — ABNORMAL LOW (ref >60)
Glucose, Bld: 99 mg/dL (ref 70–99)
Potassium: 4.5 mmol/L (ref 3.5–5.1)
Sodium: 124 mmol/L — ABNORMAL LOW (ref 135–145)
Total Bilirubin: 0.8 mg/dL (ref 0.3–1.2)
Total Protein: 6.4 g/dL — ABNORMAL LOW (ref 6.5–8.1)

## 2020-03-16 LAB — TSH: TSH: 3.78 u[IU]/mL (ref 0.350–4.500)

## 2020-03-16 LAB — APTT: aPTT: 28 seconds (ref 24–36)

## 2020-03-16 LAB — SARS CORONAVIRUS 2 BY RT PCR (HOSPITAL ORDER, PERFORMED IN ~~LOC~~ HOSPITAL LAB): SARS Coronavirus 2: NEGATIVE

## 2020-03-16 LAB — PROTIME-INR
INR: 0.9 (ref 0.8–1.2)
Prothrombin Time: 12.2 seconds (ref 11.4–15.2)

## 2020-03-16 MED ORDER — SODIUM CHLORIDE 0.9 % IV BOLUS
1000.0000 mL | Freq: Once | INTRAVENOUS | Status: AC
  Administered 2020-03-16: 1000 mL via INTRAVENOUS

## 2020-03-16 MED ORDER — SODIUM CHLORIDE 0.9% FLUSH
3.0000 mL | Freq: Once | INTRAVENOUS | Status: DC
Start: 2020-03-16 — End: 2020-03-18

## 2020-03-16 MED ORDER — SODIUM CHLORIDE 0.9 % IV SOLN
1.0000 g | Freq: Once | INTRAVENOUS | Status: AC
  Administered 2020-03-17: 1 g via INTRAVENOUS
  Filled 2020-03-16: qty 10

## 2020-03-16 NOTE — H&P (Signed)
History and Physical    Martha Olsen ZMO:[id number] DOB: [birth year] DOA: 03/16/2020  PCP: Alroy Dust, L.[name], MD  Patient coming from: home  I have personally briefly reviewed patient's old medical records in [place]  Chief Complaint: weakness  HPI: Martha Olsen is a 84 y.o. female with medical history significant of anxiety depression,DMII, grade1 diastolic dysfunction,dementia,CKDIIIA HTN, SZ d/o,CVA 6/21 s/p rehab stay with d/c 03/14/20 who presents from home with generalized weakness. Per son not eating or drinking at home for the last 48 hours. He also noted that she was ambulatory with walker at few days ago but in the last 24-48 hours has not been able to ambulate with walker due to generalized weakness. Patient gives same history and states she had no n/v/abdominal pain /dysuria/or fever/chills /cough of chest pain. She states she just felt extremely fatigued and notes she felt well initially on arriving home.   ED Course:  T:99.3, hr 127/71, hr 80, rr 18 sat 96% on ra  Labs Wbc:11.8, hgb12.3 at baseline, mcv 98, plt 231 NA:124, K 4.5, cl90, cr 1.21 at baseline  Covid:negative UA:+bacteria,+ LE, wbc 11-20 Tx:ns 1L Review of Systems: As per HPI otherwise 10 point review of systems negative.   Past Medical History:  Diagnosis Date   Anxiety    Dementia ([place])    Depression    Diabetes mellitus 2011   Headache(784.0)    History of echocardiogram    Echo 5/16: EF 55-60%, normal wall motion, grade 1 diastolic dysfunction   Hypertension    Seizure ([place])    Stroke ([place]) 2002   right side weakness   UTI (urinary tract infection) 02/2015    Past Surgical History:  Procedure Laterality Date   ABDOMINAL HYSTERECTOMY  1980's   APPENDECTOMY  1980's   CHOLECYSTECTOMY  1993   EYE SURGERY  2009   FOOT SURGERY     JOINT REPLACEMENT  2012   Hip, Knee   REVERSE SHOULDER ARTHROPLASTY  12/07/2011   Procedure: REVERSE SHOULDER ARTHROPLASTY;   Surgeon: Marin Shutter, MD;  Location: [facility];  Service: Orthopedics;  Laterality: Left;  left total reverse shoulder   TONSILLECTOMY  1951     reports that she has never smoked. She has never used smokeless tobacco. She reports that she does not drink alcohol and does not use drugs.  Allergies  Allergen Reactions   Codeine Hives    Family History  Problem Relation Age of Onset   Stroke Mother 12   Hypertension Mother    CAD Father 85   Heart attack Father    Anesthesia problems Neg Hx     Prior to Admission medications   Medication Sig Start Date End Date Taking? Authorizing Provider  ARIPiprazole (ABILIFY) 5 MG tablet  02/13/19   [provider]  aspirin 81 MG chewable tablet Chew 1 tablet (81 mg total) by mouth daily. 10/14/12   Barton Dubois, MD  atorvastatin (LIPITOR) 40 MG tablet Take 40 mg by mouth daily.    [provider]  carbidopa-levodopa (SINEMET IR) 25-100 MG tablet Take 1 tablet by mouth 3 (three) times daily. 11/14/19   Marcial Pacas, MD  carvedilol (COREG) 6.25 MG tablet Take 1 tablet (6.25 mg total) by mouth 2 (two) times daily with a meal. Patient taking differently: Take 6.25 mg by mouth in the morning, at noon, and at bedtime.  11/12/18   Suzzanne Cloud, NP  donepezil (ARICEPT) 10 MG tablet Take 10 mg by mouth daily.  12/01/19   [provider]  gabapentin (NEURONTIN) 300 MG capsule Take 600 mg by mouth 2 (two) times daily. 06/27/18   [provider]  glimepiride (AMARYL) 2 MG tablet Take 2 mg by mouth daily before breakfast.    [provider]  levETIRAcetam (KEPPRA) 500 MG tablet Take 2 tablets (1,000 mg total) by mouth 2 (two) times daily. 01/27/20   Marcial Pacas, MD  lisinopril-hydrochlorothiazide (PRINZIDE,ZESTORETIC) 20-12.5 MG per tablet Take 1 tablet by mouth daily.    [provider]  LORazepam (ATIVAN) 1 MG tablet Take 1 mg by mouth daily as needed. 01/06/20   [provider]  memantine  (NAMENDA) 10 MG tablet Take 10 mg by mouth 2 (two) times daily. 01/20/20   [provider]  metFORMIN (GLUCOPHAGE) 1000 MG tablet Take 1,000 mg by mouth daily with breakfast.  07/01/14   [provider]  mirtazapine (REMERON) 15 MG tablet Take 15 mg by mouth at bedtime. 08/02/18   [provider]  nitroGLYCERIN (NITROSTAT) 0.4 MG SL tablet Place 1 tablet (0.4 mg total) under the tongue every 5 (five) minutes as needed for chest pain. 12/11/12   Josue Hector, MD  repaglinide (PRANDIN) 0.5 MG tablet Take 0.5 mg by mouth 3 (three) times daily. 12/29/19   [provider]  sertraline (ZOLOFT) 50 MG tablet TAKE 1&1/2 TABLETS BY MOUTH DAILY Patient taking differently: Take 50 mg by mouth daily.  09/30/19   Suzzanne Cloud, NP  trimethoprim (TRIMPEX) 100 MG tablet Take 100 mg by mouth 2 (two) times daily.  07/30/18   [provider]  VIIBRYD 40 MG TABS  02/13/19   [provider]  zaleplon (SONATA) 10 MG capsule  03/10/19   [provider]    Physical Exam: Vitals:   03/16/20 2215 03/16/20 2230 03/16/20 2245 03/16/20 2300  BP: (!) 173/72 (!) 142/60 (!) 138/48 (!) 142/45  Pulse: 74 70 (!) 46 73  Resp: (!) 21 18 16 18   Temp:      TempSrc:      SpO2: 98% 98% 99% 98%  Weight:      Height:        Constitutional: NAD, calm, comfortable Vitals:   03/16/20 2215 03/16/20 2230 03/16/20 2245 03/16/20 2300  BP: (!) 173/72 (!) 142/60 (!) 138/48 (!) 142/45  Pulse: 74 70 (!) 46 73  Resp: (!) 21 18 16 18   Temp:      TempSrc:      SpO2: 98% 98% 99% 98%  Weight:      Height:       Eyes: PERRL, lids and conjunctivae normal ENMT: Mucous membranes are moist. Posterior pharynx clear of any exudate or lesions Neck: normal, supple, no masses, no thyromegaly Respiratory: clear to auscultation bilaterally, no wheezing, no crackles. Normal respiratory effort. No accessory muscle use.  Cardiovascular: Regular rate and rhythm, no murmurs / rubs / gallops.  No extremity edema. 2+ pedal pulses. No carotid bruits.  Abdomen: no tenderness, no masses palpated. No hepatosplenomegaly. Bowel sounds positive.  Musculoskeletal: no clubbing / cyanosis. No joint deformity upper and lower extremities. Good ROM, no contractures. Normal muscle tone.  Skin: no rashes, lesions, ulcers. No induration Neurologic: CN 2-12 grossly intact( other than slight facial droop on there right lower face). Sensation intact, . Strength 5/5 in all 4. Left lower ext 4+ Psychiatric: Normal judgment and insight. Alert and oriented x 3. Normal mood.    Labs on Admission: I have personally reviewed following labs  and imaging studies  CBC: Recent Labs  Lab 03/16/20 1408  WBC 11.8*  NEUTROABS 8.2*  HGB 12.3  HCT 38.2  MCV 98.2  PLT 182   Basic Metabolic Panel: Recent Labs  Lab 03/16/20 1408  NA 124*  K 4.5  CL 90*  CO2 22  GLUCOSE 99  BUN 16  CREATININE 1.21*  CALCIUM 8.5*   GFR: Estimated Creatinine Clearance: 40.5 mL/min (A) (by C-G formula based on SCr of 1.21 mg/dL (H)). Liver Function Tests: Recent Labs  Lab 03/16/20 1408  AST 21  ALT 6  ALKPHOS 78  BILITOT 0.8  PROT 6.4*  ALBUMIN 3.4*   No results for input(s): LIPASE, AMYLASE in the last 168 hours. No results for input(s): AMMONIA in the last 168 hours. Coagulation Profile: Recent Labs  Lab 03/16/20 1408  INR 0.9   Cardiac Enzymes: No results for input(s): CKTOTAL, CKMB, CKMBINDEX, TROPONINI in the last 168 hours. BNP (last 3 results) No results for input(s): PROBNP in the last 8760 hours. HbA1C: No results for input(s): HGBA1C in the last 72 hours. CBG: No results for input(s): GLUCAP in the last 168 hours. Lipid Profile: No results for input(s): CHOL, HDL, LDLCALC, TRIG, CHOLHDL, LDLDIRECT in the last 72 hours. Thyroid Function Tests: Recent Labs    03/16/20 2135  TSH 3.780   Anemia Panel: No results for input(s): VITAMINB12, FOLATE, FERRITIN, TIBC, IRON, RETICCTPCT in the last  72 hours. Urine analysis:    Component Value Date/Time   COLORURINE YELLOW 03/16/2020 2157   APPEARANCEUR CLEAR 03/16/2020 2157   APPEARANCEUR Cloudy (A) 02/09/2015 0854   LABSPEC 1.006 03/16/2020 2157   PHURINE 6.0 03/16/2020 2157   GLUCOSEU NEGATIVE 03/16/2020 2157   HGBUR NEGATIVE 03/16/2020 2157   BILIRUBINUR NEGATIVE 03/16/2020 2157   BILIRUBINUR Negative 02/09/2015 0854   KETONESUR NEGATIVE 03/16/2020 2157   PROTEINUR NEGATIVE 03/16/2020 2157   UROBILINOGEN 0.2 02/28/2015 2058   NITRITE POSITIVE (A) 03/16/2020 2157   LEUKOCYTESUR MODERATE (A) 03/16/2020 2157    Radiological Exams on Admission: CT HEAD WO CONTRAST  Result Date: 03/16/2020 CLINICAL DATA:  Generalized weakness, recent discharge from rehab facility following admission for stroke in July EXAM: CT HEAD WITHOUT CONTRAST TECHNIQUE: Contiguous axial images were obtained from the base of the skull through the vertex without intravenous contrast. COMPARISON:  MRI and CT 08/29/2018 FINDINGS: Brain: Stable region of encephalomalacia is seen involving the right corona radiata, basal ganglia and thalamus. Some asymmetric ex vacuo dilatation of the right ventricle is present on a background of more mild diffuse parenchymal volume loss. No new CT evident areas of infarct. No evidence of acute hemorrhage, hydrocephalus, extra-axial collection or mass lesion/mass effect. Patchy areas of white matter hypoattenuation are most compatible with chronic microvascular angiopathy. Vascular: Atherosclerotic calcification of the carotid siphons. No hyperdense vessel. Skull: Hyperostosis frontalis interna, a benign often senescent incidental finding. No calvarial fracture or suspicious osseous lesion. No scalp swelling or hematoma. Sinuses/Orbits: Trace right mastoid effusion with few opacified dependent air cells. Paranasal sinuses and mastoid air cells are otherwise predominantly clear. Bilateral lens replacements. Other: None. IMPRESSION: 1. No  acute intracranial abnormality. 2. Stable region of encephalomalacia involving the right corona radiata, basal ganglia, and thalamus. 3. Stable chronic microvascular angiopathy and parenchymal volume loss. 4. Chronic right mastoid effusion. Electronically Signed   By: Lovena Le M.D.   On: 03/16/2020 15:23    EKG: Independently reviewed. junctional  Assessment/Plan Patient is a 84 y.o. female with medical history significant of anxiety  depression,DMII, grade1 diastolic dysfunction,dementia,CKDIIIA HTN, SZ d/o,CVA 6/21 s/p rehab stay with d/c 03/14/20 who presents from home with generalized weakness. On evaluation found have UTI / hypovolemic hyponatremia.   Acute UTI with associated symptoms of weakness and poor appetite -CTX  -f/u on urine cultures  -de-escalate antibiotics as able   Hypovolemic hyponatremia -due to poor intake in setting of hctz use -hold HCTZ currently  -ivfs over night  -will check urine lytes/serum osmo -monitor neuro checks due to hx of sz d/o   SZ -no recent sz  -continue on keppra -sz precautions multiple acute issues uti/hypotremia lowering sz treshold -will continue to monitor closely   Acute on Chronic debility  -PT/OT to see   DMII -place on fs/iss -hold oral hypoglycemics currently  CKDIIIa -at baseline   Diastolic dysfunction  -on carvediolol  HTN -continue lisinopril ,carvediolol for now  CVA  -recent 6/21  -continue on secondary ppx  DVT prophylaxis: heparin  Code Status: FULL Family Communication: family not at beside  Disposition Plan: patient  expected to be admitted greater than 2 midnights Consults called:n/a Admission status: inpatient   Clance Boll MD [facility]   If 7PM-7AM, please contact night-coverage [email or web address] Password [facility]  03/16/2020, 11:12 PM

## 2020-03-16 NOTE — ED Provider Notes (Signed)
[facility] EMERGENCY DEPARTMENT Provider Note   CSN: [id number] Arrival date & time: 03/16/20  1348     History Chief Complaint  Patient presents with  . Weakness    Martha Olsen is a 84 y.o. female.  The history is provided by the patient and medical records. No language interpreter was used.  Illness Location:  Generalized fatigue and weakness Quality:  All over Severity:  Severe Onset quality:  Gradual Duration:  2 days Timing:  Constant Progression:  Worsening Chronicity:  New Associated symptoms: fatigue   Associated symptoms: no abdominal pain, no chest pain, no congestion, no cough, no diarrhea, no fever, no headaches, no nausea, no rash, no rhinorrhea, no shortness of breath, no vomiting and no wheezing        Past Medical History:  Diagnosis Date  . Anxiety   . Dementia ([place])   . Depression   . Diabetes mellitus 2011  . Headache(784.0)   . History of echocardiogram    Echo 5/16: EF 55-60%, normal wall motion, grade 1 diastolic dysfunction  . Hypertension   . Seizure ([place])   . Stroke [facility]) 2002   right side weakness  . UTI (urinary tract infection) 02/2015    Patient Active Problem List   Diagnosis Date Noted  . Dementia with behavioral disturbance ([place]) 01/26/2020  . Fall 03/01/2015  . Pressure ulcer 03/01/2015  . Blood poisoning   . Dementia ([place])   . Essential hypertension 02/28/2015  . GERD (gastroesophageal reflux disease) 02/28/2015  . UTI (lower urinary tract infection) 02/28/2015  . Sepsis ([place]) 02/28/2015  . Acute encephalopathy 02/28/2015  . Excessive daytime sleepiness 09/03/2014  . Abnormality of gait 08/18/2014  . Urinary urgency 08/18/2014  . Sleep apnea 07/14/2014  . CAD (coronary artery disease) 12/11/2012  . Abnormal ECG 11/14/2012  . Stroke ([place])   . Hypertension   . Anxiety   . Depression   . Seizure ([place])   . Seizure disorder ([place]) 10/12/2012  . Elevated troponin I level 10/12/2012  .  Diarrhea 10/12/2012  . Pneumonia 10/12/2012  . Hypokalemia 10/12/2012  . DM (diabetes mellitus) ([place]) 10/12/2012  . HTN (hypertension) 10/12/2012  . Dyslipidemia 10/12/2012  . Rotator cuff tear arthropathy, left 12/08/2011    Past Surgical History:  Procedure Laterality Date  . ABDOMINAL HYSTERECTOMY  1980's  . APPENDECTOMY  1980's  . CHOLECYSTECTOMY  1993  . EYE SURGERY  2009  . FOOT SURGERY    . JOINT REPLACEMENT  2012   Hip, Knee  . REVERSE SHOULDER ARTHROPLASTY  12/07/2011   Procedure: REVERSE SHOULDER ARTHROPLASTY;  Surgeon: Marin Shutter, MD;  Location: [facility];  Service: Orthopedics;  Laterality: Left;  left total reverse shoulder  . TONSILLECTOMY  1951     OB History   No obstetric history on file.     Family History  Problem Relation Age of Onset  . Stroke Mother 7  . Hypertension Mother   . CAD Father 50  . Heart attack Father   . Anesthesia problems Neg Hx     Social History   Tobacco Use  . Smoking status: Never Smoker  . Smokeless tobacco: Never Used  Substance Use Topics  . Alcohol use: No    Alcohol/week: 0.0 standard drinks  . Drug use: No    Home Medications Prior to Admission medications   Medication Sig Start Date End Date Taking? Authorizing Provider  ARIPiprazole (ABILIFY) 5 MG tablet  02/13/19   [provider]  aspirin 81 MG chewable tablet Chew 1 tablet (81 mg total) by mouth daily. 10/14/12   Barton Dubois, MD  atorvastatin (LIPITOR) 40 MG tablet Take 40 mg by mouth daily.    [provider]  carbidopa-levodopa (SINEMET IR) 25-100 MG tablet Take 1 tablet by mouth 3 (three) times daily. 11/14/19   Marcial Pacas, MD  carvedilol (COREG) 6.25 MG tablet Take 1 tablet (6.25 mg total) by mouth 2 (two) times daily with a meal. Patient taking differently: Take 6.25 mg by mouth in the morning, at noon, and at bedtime.  11/12/18   Suzzanne Cloud, NP  donepezil (ARICEPT) 10 MG tablet Take 10 mg by mouth daily. 12/01/19   [provider]  gabapentin (NEURONTIN) 300 MG capsule Take 600 mg by mouth 2 (two) times daily. 06/27/18   [provider]  glimepiride (AMARYL) 2 MG tablet Take 2 mg by mouth daily before breakfast.    [provider]  levETIRAcetam (KEPPRA) 500 MG tablet Take 2 tablets (1,000 mg total) by mouth 2 (two) times daily. 01/27/20   Marcial Pacas, MD  lisinopril-hydrochlorothiazide (PRINZIDE,ZESTORETIC) 20-12.5 MG per tablet Take 1 tablet by mouth daily.    [provider]  LORazepam (ATIVAN) 1 MG tablet Take 1 mg by mouth daily as needed. 01/06/20   [provider]  memantine (NAMENDA) 10 MG tablet Take 10 mg by mouth 2 (two) times daily. 01/20/20   [provider]  metFORMIN (GLUCOPHAGE) 1000 MG tablet Take 1,000 mg by mouth daily with breakfast.  07/01/14   [provider]  mirtazapine (REMERON) 15 MG tablet Take 15 mg by mouth at bedtime. 08/02/18   [provider]  nitroGLYCERIN (NITROSTAT) 0.4 MG SL tablet Place 1 tablet (0.4 mg total) under the tongue every 5 (five) minutes as needed for chest pain. 12/11/12   Josue Hector, MD  repaglinide (PRANDIN) 0.5 MG tablet Take 0.5 mg by mouth 3 (three) times daily. 12/29/19   [provider]  sertraline (ZOLOFT) 50 MG tablet TAKE 1&1/2 TABLETS BY MOUTH DAILY Patient taking differently: Take 50 mg by mouth daily.  09/30/19   Suzzanne Cloud, NP  trimethoprim (TRIMPEX) 100 MG tablet Take 100 mg by mouth 2 (two) times daily.  07/30/18   [provider]  VIIBRYD 40 MG TABS  02/13/19   [provider]  zaleplon (SONATA) 10 MG capsule  03/10/19   [provider]    Allergies    Codeine  Review of Systems   Review of Systems  Constitutional: Positive for fatigue. Negative for chills, diaphoresis and fever.  HENT: Negative for congestion and rhinorrhea.   Eyes: Negative for visual disturbance.  Respiratory: Negative for cough, chest tightness, shortness of breath and  wheezing.   Cardiovascular: Negative for chest pain.  Gastrointestinal: Negative for abdominal pain, constipation, diarrhea, nausea and vomiting.  Genitourinary: Negative for dysuria, flank pain and frequency.  Musculoskeletal: Negative for back pain, neck pain and neck stiffness.  Skin: Negative for rash and wound.  Neurological: Positive for light-headedness. Negative for dizziness, weakness, numbness and headaches.  Psychiatric/Behavioral: Negative for agitation and confusion.  All other systems reviewed and are negative.   Physical Exam Updated Vital Signs BP 131/85 (BP Location: Right Arm)   Pulse 77   Temp 99.3 F (37.4 C) (Oral)   Resp 16   Ht 5\' 3"  (1.6 m)   Wt 97.1 kg   SpO2 96%   BMI 37.91 kg/m   Physical Exam Vitals  and nursing note reviewed.  Constitutional:      General: She is not in acute distress.    Appearance: She is well-developed. She is not ill-appearing, toxic-appearing or diaphoretic.  HENT:     Head: Atraumatic.     Comments: R facial droop    Right Ear: External ear normal.     Left Ear: External ear normal.     Nose: Nose normal. No congestion or rhinorrhea.     Mouth/Throat:     Mouth: Mucous membranes are dry.     Pharynx: No oropharyngeal exudate or posterior oropharyngeal erythema.  Eyes:     Conjunctiva/sclera: Conjunctivae normal.     Pupils: Pupils are equal, round, and reactive to light.  Cardiovascular:     Pulses: Normal pulses.     Heart sounds: No murmur heard.   Pulmonary:     Effort: No respiratory distress.     Breath sounds: No stridor. No wheezing, rhonchi or rales.  Chest:     Chest wall: No tenderness.  Abdominal:     General: Abdomen is flat. There is no distension.     Tenderness: There is no abdominal tenderness. There is no right CVA tenderness, left CVA tenderness or rebound.  Musculoskeletal:        General: No tenderness or signs of injury.     Cervical back: Normal range of motion and neck supple.     Right  lower leg: No edema.     Left lower leg: No edema.  Skin:    General: Skin is warm.     Capillary Refill: Capillary refill takes less than 2 seconds.     Coloration: Skin is not pale.     Findings: No erythema or rash.  Neurological:     General: No focal deficit present.     Mental Status: She is alert and oriented to person, place, and time.     GCS: GCS eye subscore is 4. GCS verbal subscore is 5. GCS motor subscore is 6.     Cranial Nerves: Facial asymmetry present. No dysarthria.     Sensory: No sensory deficit.     Motor: No tremor or abnormal muscle tone.     Coordination: Coordination normal.     Deep Tendon Reflexes: Reflexes are normal and symmetric.     Comments: Right facial droop.  Psychiatric:        Mood and Affect: Mood normal.     ED Results / Procedures / Treatments   Labs (all labs ordered are listed, but only abnormal results are displayed) Labs Reviewed  CBC - Abnormal; Notable for the following components:      Result Value   WBC 11.8 (*)    All other components within normal limits  DIFFERENTIAL - Abnormal; Notable for the following components:   Neutro Abs 8.2 (*)    All other components within normal limits  COMPREHENSIVE METABOLIC PANEL - Abnormal; Notable for the following components:   Sodium 124 (*)    Chloride 90 (*)    Creatinine, Ser 1.21 (*)    Calcium 8.5 (*)    Total Protein 6.4 (*)    Albumin 3.4 (*)    GFR calc non Af Amer 42 (*)    GFR calc Af Amer 49 (*)    All other components within normal limits  URINALYSIS, ROUTINE W REFLEX MICROSCOPIC - Abnormal; Notable for the following components:   Nitrite POSITIVE (*)    Leukocytes,Ua MODERATE (*)  Bacteria, UA MANY (*)    All other components within normal limits  URINE CULTURE  SARS CORONAVIRUS 2 BY RT PCR [facility] ORDER, [place] LAB)  PROTIME-INR  APTT  TSH    EKG EKG Interpretation  Date/Time:  Tuesday March 16 2020 14:00:00 EDT Ventricular  Rate:  79 PR Interval:    QRS Duration: 92 QT Interval:  370 QTC Calculation: 424 R Axis:   55 Text Interpretation: Accelerated Junctional rhythm Abnormal ECG when comapred to prior, more artifact. No STEMI Confirmed by Antony Blackbird [id number]) on 03/16/2020 9:19:38 PM   Radiology CT HEAD WO CONTRAST  Result Date: 03/16/2020 CLINICAL DATA:  Generalized weakness, recent discharge from rehab facility following admission for stroke in July EXAM: CT HEAD WITHOUT CONTRAST TECHNIQUE: Contiguous axial images were obtained from the base of the skull through the vertex without intravenous contrast. COMPARISON:  MRI and CT 08/29/2018 FINDINGS: Brain: Stable region of encephalomalacia is seen involving the right corona radiata, basal ganglia and thalamus. Some asymmetric ex vacuo dilatation of the right ventricle is present on a background of more mild diffuse parenchymal volume loss. No new CT evident areas of infarct. No evidence of acute hemorrhage, hydrocephalus, extra-axial collection or mass lesion/mass effect. Patchy areas of white matter hypoattenuation are most compatible with chronic microvascular angiopathy. Vascular: Atherosclerotic calcification of the carotid siphons. No hyperdense vessel. Skull: Hyperostosis frontalis interna, a benign often senescent incidental finding. No calvarial fracture or suspicious osseous lesion. No scalp swelling or hematoma. Sinuses/Orbits: Trace right mastoid effusion with few opacified dependent air cells. Paranasal sinuses and mastoid air cells are otherwise predominantly clear. Bilateral lens replacements. Other: None. IMPRESSION: 1. No acute intracranial abnormality. 2. Stable region of encephalomalacia involving the right corona radiata, basal ganglia, and thalamus. 3. Stable chronic microvascular angiopathy and parenchymal volume loss. 4. Chronic right mastoid effusion. Electronically Signed   By: Lovena Le M.D.   On: 03/16/2020 15:23    Procedures Procedures  (including critical care time)  CRITICAL CARE Performed by: Gwenyth Allegra Alainah Phang Total critical care time: 30 minutes Critical care time was exclusive of separately billable procedures and treating other patients. Critical care was necessary to treat or prevent imminent or life-threatening deterioration. Critical care was time spent personally by me on the following activities: development of treatment plan with patient and/or surrogate as well as nursing, discussions with consultants, evaluation of patient's response to treatment, examination of patient, obtaining history from patient or surrogate, ordering and performing treatments and interventions, ordering and review of laboratory studies, ordering and review of radiographic studies, pulse oximetry and re-evaluation of patient's condition.   Medications Ordered in ED Medications  sodium chloride flush (NS) 0.9 % injection 3 mL (has no administration in time range)  sodium chloride 0.9 % bolus 1,000 mL (has no administration in time range)    ED Course  I have reviewed the triage vital signs and the nursing notes.  Pertinent labs & imaging results that were available during my care of the patient were reviewed by me and considered in my medical decision making (see chart for details).    MDM Rules/Calculators/A&P                          Martha Olsen is a 84 y.o. female with a past medical history significant for prior stroke with residual right-sided facial droop, dementia, hypertension, dyslipidemia, diabetes, seizure disorder, GERD, CAD, prior urinary tract infection, and depression/anxiety who  presents for decreased ambulation with significant generalized weakness and fatigue.  Patient reports that she was discharged from rehab 2 days ago after a long stay after a prior stroke.  She says that last week she was able to ambulate with a walker but her last 40 hours has lost ability to ambulate due to generalized weakness and  fatigue.  She reports that this is new.  She reports that she has no energy but is denying any fevers, chills, cough, shortness of breath, chest pain, palpitations, nausea, vomiting, constipation, diarrhea, or urinary changes.  She reports he simply has no energy.  She reports she may not be eating or drinking as much as normal.  On exam, lungs are clear and chest is nontender.  Abdomen is nontender.  Patient moving all extremities.  Patient does have right-sided facial droop but extra ocular motions are intact.  Pupils are symmetric and reactive.  Clear speech.  She is alert and answering questions appropriately.  Good pulses in extremities.  Patient had screening labs for her generalized fatigue and weakness and she does appear to have hyponatremia.  Her sodium is 124 and the last time it was checked it was normal.  I am concerned that her hyponatremia may be contributing to the fatigue and generalized weakness.  Given her history of stroke, seizures, and this new hyponatremia, I am concerned about her going home as this could lead to seizures with her hyponatremia.  Patient had other work-up done in triage already including a CBC which did not show any leukocytosis or anemia.  CT head appears stable no acute intracranial abnormality.  No STEMI seen on EKG.  Given her history of UTI, will get a urinalysis as well as a Covid test.  We will give her some fluids to start.  With her low sodium and inability to ambulate like she did several days ago, I do suspect she will need admission.  Anticipate admission after work-up is completed.  Urinalysis does show evidence of pneumonia.  Will treat for UTI and admit for decreased ambulation for PT/OT to see as well as treatment of hyponatremia.  Final Clinical Impression(s) / ED Diagnoses Final diagnoses:  Generalized weakness  Hyponatremia  Acute cystitis without hematuria  Lower urinary tract infectious disease    Clinical Impression: 1. Generalized  weakness   2. Hyponatremia   3. Acute cystitis without hematuria   4. Lower urinary tract infectious disease     Disposition: Admit  This note was prepared with assistance of Dragon voice recognition software. Occasional wrong-word or sound-a-like substitutions may have occurred due to the inherent limitations of voice recognition software.     Bryah [place], Gwenyth Allegra, MD 03/16/20 [phone]

## 2020-03-16 NOTE — ED Triage Notes (Signed)
Pt from home with ems for generalized weakness, pt recently discharged from rehab facility 2 days ago, pt was in rehab for a stroke in July. No weakness noted, right sided facial droop noted but unknown LSN and unknown deficits from prior stroke. Pt has dementia at baseline. Pt alert.

## 2020-03-17 ENCOUNTER — Inpatient Hospital Stay (HOSPITAL_COMMUNITY): Payer: Medicare Other

## 2020-03-17 DIAGNOSIS — E871 Hypo-osmolality and hyponatremia: Secondary | ICD-10-CM | POA: Diagnosis present

## 2020-03-17 DIAGNOSIS — Z66 Do not resuscitate: Secondary | ICD-10-CM | POA: Diagnosis present

## 2020-03-17 DIAGNOSIS — Z90710 Acquired absence of both cervix and uterus: Secondary | ICD-10-CM | POA: Diagnosis not present

## 2020-03-17 DIAGNOSIS — Z9049 Acquired absence of other specified parts of digestive tract: Secondary | ICD-10-CM | POA: Diagnosis not present

## 2020-03-17 DIAGNOSIS — E1122 Type 2 diabetes mellitus with diabetic chronic kidney disease: Secondary | ICD-10-CM | POA: Diagnosis present

## 2020-03-17 DIAGNOSIS — Z7982 Long term (current) use of aspirin: Secondary | ICD-10-CM | POA: Diagnosis not present

## 2020-03-17 DIAGNOSIS — Z96612 Presence of left artificial shoulder joint: Secondary | ICD-10-CM | POA: Diagnosis present

## 2020-03-17 DIAGNOSIS — E669 Obesity, unspecified: Secondary | ICD-10-CM | POA: Diagnosis present

## 2020-03-17 DIAGNOSIS — Z6831 Body mass index (BMI) 31.0-31.9, adult: Secondary | ICD-10-CM | POA: Diagnosis not present

## 2020-03-17 DIAGNOSIS — I129 Hypertensive chronic kidney disease with stage 1 through stage 4 chronic kidney disease, or unspecified chronic kidney disease: Secondary | ICD-10-CM | POA: Diagnosis present

## 2020-03-17 DIAGNOSIS — N3000 Acute cystitis without hematuria: Secondary | ICD-10-CM | POA: Diagnosis not present

## 2020-03-17 DIAGNOSIS — Z79899 Other long term (current) drug therapy: Secondary | ICD-10-CM | POA: Diagnosis not present

## 2020-03-17 DIAGNOSIS — J90 Pleural effusion, not elsewhere classified: Secondary | ICD-10-CM | POA: Diagnosis not present

## 2020-03-17 DIAGNOSIS — R0602 Shortness of breath: Secondary | ICD-10-CM | POA: Diagnosis not present

## 2020-03-17 DIAGNOSIS — Z823 Family history of stroke: Secondary | ICD-10-CM | POA: Diagnosis not present

## 2020-03-17 DIAGNOSIS — F0280 Dementia in other diseases classified elsewhere without behavioral disturbance: Secondary | ICD-10-CM | POA: Diagnosis present

## 2020-03-17 DIAGNOSIS — Z20822 Contact with and (suspected) exposure to covid-19: Secondary | ICD-10-CM | POA: Diagnosis present

## 2020-03-17 DIAGNOSIS — Z885 Allergy status to narcotic agent status: Secondary | ICD-10-CM | POA: Diagnosis not present

## 2020-03-17 DIAGNOSIS — Z7984 Long term (current) use of oral hypoglycemic drugs: Secondary | ICD-10-CM | POA: Diagnosis not present

## 2020-03-17 DIAGNOSIS — E861 Hypovolemia: Secondary | ICD-10-CM | POA: Diagnosis present

## 2020-03-17 DIAGNOSIS — N1831 Chronic kidney disease, stage 3a: Secondary | ICD-10-CM | POA: Diagnosis present

## 2020-03-17 DIAGNOSIS — R5381 Other malaise: Secondary | ICD-10-CM | POA: Diagnosis present

## 2020-03-17 DIAGNOSIS — N390 Urinary tract infection, site not specified: Secondary | ICD-10-CM | POA: Diagnosis present

## 2020-03-17 DIAGNOSIS — Z8673 Personal history of transient ischemic attack (TIA), and cerebral infarction without residual deficits: Secondary | ICD-10-CM | POA: Diagnosis not present

## 2020-03-17 DIAGNOSIS — Z8249 Family history of ischemic heart disease and other diseases of the circulatory system: Secondary | ICD-10-CM | POA: Diagnosis not present

## 2020-03-17 DIAGNOSIS — R531 Weakness: Secondary | ICD-10-CM | POA: Diagnosis present

## 2020-03-17 DIAGNOSIS — G40909 Epilepsy, unspecified, not intractable, without status epilepticus: Secondary | ICD-10-CM | POA: Diagnosis present

## 2020-03-17 DIAGNOSIS — G20 Parkinson's disease: Secondary | ICD-10-CM | POA: Diagnosis present

## 2020-03-17 LAB — GLUCOSE, CAPILLARY
Glucose-Capillary: 97 mg/dL (ref 70–99)
Glucose-Capillary: 112 mg/dL — ABNORMAL HIGH (ref 70–99)

## 2020-03-17 LAB — CBC WITH DIFFERENTIAL/PLATELET
Abs Immature Granulocytes: 0.04 10*3/uL (ref 0.00–0.07)
Basophils Absolute: 0 10*3/uL (ref 0.0–0.1)
Basophils Relative: 0 %
Eosinophils Absolute: 0.2 10*3/uL (ref 0.0–0.5)
Eosinophils Relative: 2 %
HCT: 38.1 % (ref 36.0–46.0)
Hemoglobin: 12.6 g/dL (ref 12.0–15.0)
Immature Granulocytes: 0 %
Lymphocytes Relative: 25 %
Lymphs Abs: 2.4 10*3/uL (ref 0.7–4.0)
MCH: 31.5 pg (ref 26.0–34.0)
MCHC: 33.1 g/dL (ref 30.0–36.0)
MCV: 95.3 fL (ref 80.0–100.0)
Monocytes Absolute: 0.7 10*3/uL (ref 0.1–1.0)
Monocytes Relative: 7 %
Neutro Abs: 6.3 10*3/uL (ref 1.7–7.7)
Neutrophils Relative %: 66 %
Platelets: 208 10*3/uL (ref 150–400)
RBC: 4 MIL/uL (ref 3.87–5.11)
RDW: 13.5 % (ref 11.5–15.5)
WBC: 9.5 10*3/uL (ref 4.0–10.5)
nRBC: 0 % (ref 0.0–0.2)

## 2020-03-17 LAB — COMPREHENSIVE METABOLIC PANEL
ALT: 12 U/L (ref 0–44)
AST: 32 U/L (ref 15–41)
Albumin: 3 g/dL — ABNORMAL LOW (ref 3.5–5.0)
Alkaline Phosphatase: 69 U/L (ref 38–126)
Anion gap: 9 (ref 5–15)
BUN: 10 mg/dL (ref 8–23)
CO2: 20 mmol/L — ABNORMAL LOW (ref 22–32)
Calcium: 7.9 mg/dL — ABNORMAL LOW (ref 8.9–10.3)
Chloride: 105 mmol/L (ref 98–111)
Creatinine, Ser: 0.86 mg/dL (ref 0.44–1.00)
GFR calc Af Amer: >60 mL/min (ref >60)
GFR calc non Af Amer: >60 mL/min (ref >60)
Glucose, Bld: 85 mg/dL (ref 70–99)
Potassium: 4.9 mmol/L (ref 3.5–5.1)
Sodium: 134 mmol/L — ABNORMAL LOW (ref 135–145)
Total Bilirubin: 1.8 mg/dL — ABNORMAL HIGH (ref 0.3–1.2)
Total Protein: 5.8 g/dL — ABNORMAL LOW (ref 6.5–8.1)

## 2020-03-17 LAB — TSH: TSH: 2.859 u[IU]/mL (ref 0.350–4.500)

## 2020-03-17 LAB — BRAIN NATRIURETIC PEPTIDE: B Natriuretic Peptide: 61.9 pg/mL (ref 0.0–100.0)

## 2020-03-17 LAB — CBG MONITORING, ED
Glucose-Capillary: 88 mg/dL (ref 70–99)
Glucose-Capillary: 100 mg/dL — ABNORMAL HIGH (ref 70–99)

## 2020-03-17 LAB — HEMOGLOBIN A1C
Hgb A1c MFr Bld: 5.5 % (ref 4.8–5.6)
Mean Plasma Glucose: 111.15 mg/dL

## 2020-03-17 LAB — OSMOLALITY: Osmolality: 291 mOsm/kg (ref 275–295)

## 2020-03-17 MED ORDER — INSULIN ASPART 100 UNIT/ML ~~LOC~~ SOLN
0.0000 [IU] | Freq: Three times a day (TID) | SUBCUTANEOUS | Status: DC
  Administered 2020-03-18: 2 [IU] via SUBCUTANEOUS

## 2020-03-17 MED ORDER — ONDANSETRON HCL 4 MG PO TABS: 4.0000 mg | ORAL_TABLET | Freq: Four times a day (QID) | ORAL | Status: DC | PRN

## 2020-03-17 MED ORDER — HEPARIN SODIUM (PORCINE) 5000 UNIT/ML IJ SOLN
5000.0000 [IU] | Freq: Three times a day (TID) | INTRAMUSCULAR | Status: DC
  Administered 2020-03-17 – 2020-03-18 (×4): 5000 [IU] via SUBCUTANEOUS
  Filled 2020-03-17 (×5): qty 1

## 2020-03-17 MED ORDER — ALBUTEROL SULFATE (2.5 MG/3ML) 0.083% IN NEBU: 2.5000 mg | INHALATION_SOLUTION | RESPIRATORY_TRACT | Status: DC | PRN

## 2020-03-17 MED ORDER — SODIUM CHLORIDE 0.9 % IV SOLN: INTRAVENOUS | Status: DC

## 2020-03-17 MED ORDER — INSULIN ASPART 100 UNIT/ML ~~LOC~~ SOLN: 0.0000 [IU] | Freq: Every day | SUBCUTANEOUS | Status: DC

## 2020-03-17 MED ORDER — ONDANSETRON HCL 4 MG/2ML IJ SOLN: 4.0000 mg | Freq: Four times a day (QID) | INTRAMUSCULAR | Status: DC | PRN

## 2020-03-17 MED ORDER — CARVEDILOL 6.25 MG PO TABS
6.2500 mg | ORAL_TABLET | Freq: Two times a day (BID) | ORAL | Status: DC
  Administered 2020-03-17 – 2020-03-18 (×3): 6.25 mg via ORAL
  Filled 2020-03-17: qty 1
  Filled 2020-03-17: qty 2
  Filled 2020-03-17: qty 1

## 2020-03-17 MED ORDER — INSULIN ASPART 100 UNIT/ML ~~LOC~~ SOLN
2.0000 [IU] | Freq: Three times a day (TID) | SUBCUTANEOUS | Status: DC
  Administered 2020-03-17: 2 [IU] via SUBCUTANEOUS

## 2020-03-17 MED ORDER — SODIUM CHLORIDE 0.9 % IV SOLN
1.0000 g | INTRAVENOUS | Status: DC
  Administered 2020-03-17: 1 g via INTRAVENOUS
  Filled 2020-03-17 (×3): qty 10

## 2020-03-17 MED ORDER — DONEPEZIL HCL 10 MG PO TABS
10.0000 mg | ORAL_TABLET | Freq: Every day | ORAL | Status: DC
  Administered 2020-03-17 – 2020-03-18 (×2): 10 mg via ORAL
  Filled 2020-03-17: qty 1
  Filled 2020-03-17: qty 2
  Filled 2020-03-17: qty 1

## 2020-03-17 MED ORDER — ACETAMINOPHEN 650 MG RE SUPP: 650.0000 mg | Freq: Four times a day (QID) | RECTAL | Status: DC | PRN

## 2020-03-17 MED ORDER — CARBIDOPA-LEVODOPA 25-100 MG PO TABS
1.0000 | ORAL_TABLET | Freq: Three times a day (TID) | ORAL | Status: DC
  Administered 2020-03-17 – 2020-03-18 (×5): 1 via ORAL
  Filled 2020-03-17 (×7): qty 1

## 2020-03-17 MED ORDER — ACETAMINOPHEN 325 MG PO TABS: 650.0000 mg | ORAL_TABLET | Freq: Four times a day (QID) | ORAL | Status: DC | PRN

## 2020-03-17 MED ORDER — ASPIRIN 81 MG PO CHEW
81.0000 mg | CHEWABLE_TABLET | Freq: Every day | ORAL | Status: DC
  Administered 2020-03-17 – 2020-03-18 (×2): 81 mg via ORAL
  Filled 2020-03-17 (×2): qty 1

## 2020-03-17 MED ORDER — ATORVASTATIN CALCIUM 40 MG PO TABS
40.0000 mg | ORAL_TABLET | Freq: Every day | ORAL | Status: DC
  Administered 2020-03-17 – 2020-03-18 (×2): 40 mg via ORAL
  Filled 2020-03-17 (×2): qty 1

## 2020-03-17 MED ORDER — INSULIN ASPART 100 UNIT/ML ~~LOC~~ SOLN: 0.0000 [IU] | Freq: Three times a day (TID) | SUBCUTANEOUS | Status: DC

## 2020-03-17 MED ORDER — LEVETIRACETAM 500 MG PO TABS
1000.0000 mg | ORAL_TABLET | Freq: Two times a day (BID) | ORAL | Status: DC
  Administered 2020-03-17 – 2020-03-18 (×3): 1000 mg via ORAL
  Filled 2020-03-17 (×3): qty 2

## 2020-03-17 MED ORDER — SODIUM CHLORIDE 0.9% FLUSH
3.0000 mL | Freq: Two times a day (BID) | INTRAVENOUS | Status: DC
  Administered 2020-03-17 – 2020-03-18 (×2): 3 mL via INTRAVENOUS

## 2020-03-17 MED ORDER — LISINOPRIL 20 MG PO TABS
20.0000 mg | ORAL_TABLET | Freq: Every day | ORAL | Status: DC
  Administered 2020-03-17 – 2020-03-18 (×2): 20 mg via ORAL
  Filled 2020-03-17 (×2): qty 1

## 2020-03-17 NOTE — Progress Notes (Signed)
PROGRESS NOTE  Martha Olsen ZJQ:[id number] DOB: Sep 11, 1935 DOA: 03/16/2020 PCP: Martha Olsen, L.[name], MD  HPI/Recap of past 24 hours: HPI from Dr Trixie Deis Martha Olsen is a 84 y.o. female with medical history significant of anxiety depression,DMII, grade1 diastolic dysfunction,dementia,CKDIIIA HTN, SZ d/o,CVA 6/21 s/p rehab stay with d/c 03/14/20 who presents from home with generalized weakness. Per son not eating or drinking at home for the last 48 hours. He also noted that she was ambulatory with walker at few days ago but in the last 24-48 hours has not been able to ambulate with walker due to generalized weakness. Patient gives same history and states she had no n/v/abdominal pain /dysuria/or fever/chills /cough of chest pain. She states she just felt extremely fatigued and notes she felt well initially on arriving home. ED Course: T:99.3, hr 127/71, hr 80, rr 18 sat 96% on ra. Labs Wbc:11.8, hgb12.3 at baseline, NA:124, cr 1.21 at baseline, Covid:negative, UA:+bacteria,+ LE, wbc 11-20.  Admitted for further management.    Today, patient continues to report generalized weakness, otherwise denies any new complaints.  Daughter at bedside.  Patient denies any abdominal pain, nausea/vomiting, fever/chills, chest pain, shortness of breath.    Assessment/Plan: Active Problems:   UTI (urinary tract infection)   UTI Currently afebrile, with no leukocytosis UA showed positive bacteria, leukocytes, WBC 11-20 UC pending Continue ceftriaxone  Diabetes mellitus type 2 A1c on 03/17/2020 was 5.5 SSI, Accu-Cheks, hypoglycemic protocol  CKD stage IIIa Creatinine at baseline Daily BMP  Hypertension Continue lisinopril, Coreg  History of chronic diastolic dysfunction Stable, monitor closely  CVA Continue aspirin, Lipitor  Parkinson's disease Continue Sinemet  History of seizure Continue Keppra  Debility PT/OT  Dementia Continue Aricept  Obesity Lifestyle modification  advised         Malnutrition Type:      Malnutrition Characteristics:      Nutrition Interventions:       Estimated body mass index is 37.91 kg/m as calculated from the following:   Height as of this encounter: 5\' 3"  (1.6 m).   Weight as of this encounter: 97.1 kg.     Code Status: DNR  Family Communication: Discussed with daughter at bedside on 03/17/2020  Disposition Plan: Status is: Inpatient  Remains inpatient appropriate because:Hemodynamically unstable and Inpatient level of care appropriate due to severity of illness   Dispo: The patient is from: Home              Anticipated d/c is to: Home              Anticipated d/c date is: 2 days              Patient currently is not medically stable to d/c.    Consultants:  None  Procedures:  None  Antimicrobials:  Ceftriaxone  DVT prophylaxis: Heparin [place]   Objective: Vitals:   03/17/20 0652 03/17/20 0800 03/17/20 1400 03/17/20 1504  BP: (!) 178/48 (!) 171/48 (!) 154/57 (!) 174/75  Pulse: 81 84 77 79  Resp: 20 18 20 16   Temp:   98.1 F (36.7 C) 98.8 F (37.1 C)  TempSrc:   Oral Oral  SpO2: 96% 96% 97% 98%  Weight:      Height:        Intake/Output Summary (Last 24 hours) at 03/17/2020 1744 Last data filed at 03/17/2020 1600 Gross per 24 hour  Intake 1477.11 ml  Output 1450 ml  Net 27.11 ml   Filed Weights   03/16/20 1357  Weight: 97.1 kg    Exam:  General: NAD   Cardiovascular: S1, S2 present  Respiratory: CTAB  Abdomen: Soft, nontender, nondistended, bowel sounds present  Musculoskeletal: No bilateral pedal edema noted  Skin: Normal  Psychiatry: Normal mood   Data Reviewed: CBC: Recent Labs  Lab 03/16/20 1408 03/17/20 0457  WBC 11.8* 9.5  NEUTROABS 8.2* 6.3  HGB 12.3 12.6  HCT 38.2 38.1  MCV 98.2 95.3  PLT 231 893   Basic Metabolic Panel: Recent Labs  Lab 03/16/20 1408 03/17/20 0637  NA 124* 134*  K 4.5 4.9  CL 90* 105  CO2 22 20*  GLUCOSE 99 85   BUN 16 10  CREATININE 1.21* 0.86  CALCIUM 8.5* 7.9*   GFR: Estimated Creatinine Clearance: 56.9 mL/min (by C-G formula based on SCr of 0.86 mg/dL). Liver Function Tests: Recent Labs  Lab 03/16/20 1408 03/17/20 0637  AST 21 32  ALT 6 12  ALKPHOS 78 69  BILITOT 0.8 1.8*  PROT 6.4* 5.8*  ALBUMIN 3.4* 3.0*   No results for input(s): LIPASE, AMYLASE in the last 168 hours. No results for input(s): AMMONIA in the last 168 hours. Coagulation Profile: Recent Labs  Lab 03/16/20 1408  INR 0.9   Cardiac Enzymes: No results for input(s): CKTOTAL, CKMB, CKMBINDEX, TROPONINI in the last 168 hours. BNP (last 3 results) No results for input(s): PROBNP in the last 8760 hours. HbA1C: Recent Labs    03/17/20 0457  HGBA1C 5.5   CBG: Recent Labs  Lab 03/17/20 0749 03/17/20 1220 03/17/20 1606  GLUCAP 88 100* 97   Lipid Profile: No results for input(s): CHOL, HDL, LDLCALC, TRIG, CHOLHDL, LDLDIRECT in the last 72 hours. Thyroid Function Tests: Recent Labs    03/17/20 0457  TSH 2.859   Anemia Panel: No results for input(s): VITAMINB12, FOLATE, FERRITIN, TIBC, IRON, RETICCTPCT in the last 72 hours. Urine analysis:    Component Value Date/Time   COLORURINE YELLOW 03/16/2020 2157   APPEARANCEUR CLEAR 03/16/2020 2157   APPEARANCEUR Cloudy (A) 02/09/2015 0854   LABSPEC 1.006 03/16/2020 2157   PHURINE 6.0 03/16/2020 2157   GLUCOSEU NEGATIVE 03/16/2020 2157   HGBUR NEGATIVE 03/16/2020 2157   BILIRUBINUR NEGATIVE 03/16/2020 2157   BILIRUBINUR Negative 02/09/2015 0854   KETONESUR NEGATIVE 03/16/2020 2157   PROTEINUR NEGATIVE 03/16/2020 2157   UROBILINOGEN 0.2 02/28/2015 2058   NITRITE POSITIVE (A) 03/16/2020 2157   LEUKOCYTESUR MODERATE (A) 03/16/2020 2157   Sepsis Labs: @LABRCNTIP (procalcitonin:4,lacticidven:4)  ) Recent Results (from the past 240 hour(s))  SARS Coronavirus 2 by RT PCR (hospital order, performed in [facility] hospital lab) Nasopharyngeal Nasopharyngeal  Swab     Status: None   Collection Time: 03/16/20  9:45 PM   Specimen: Nasopharyngeal Swab  Result Value Ref Range Status   SARS Coronavirus 2 NEGATIVE NEGATIVE Final    Comment: (NOTE) SARS-CoV-2 target nucleic acids are NOT DETECTED.  The SARS-CoV-2 RNA is generally detectable in upper and lower respiratory specimens during the acute phase of infection. The lowest concentration of SARS-CoV-2 viral copies this assay can detect is 250 copies / mL. A negative result does not preclude SARS-CoV-2 infection and should not be used as the sole basis for treatment or other patient management decisions.  A negative result may occur with improper specimen collection / handling, submission of specimen other than nasopharyngeal swab, presence of viral mutation(s) within the areas targeted by this assay, and inadequate number of viral copies (<250 copies / mL). A negative result must be combined with  clinical observations, patient history, and epidemiological information.  Fact Sheet for Patients:   StrictlyIdeas.no  Fact Sheet for Healthcare Providers: BankingDealers.co.za  This test is not yet approved or  cleared by the [place] FDA and has been authorized for detection and/or diagnosis of SARS-CoV-2 by FDA under an Emergency Use Authorization (EUA).  This EUA will remain in effect (meaning this test can be used) for the duration of the COVID-19 declaration under Section 564(b)(1) of the Act, 21 U.S.C. section 360bbb-3(b)(1), unless the authorization is terminated or revoked sooner.  Performed at [facility], [place] [address], [place], [place] [address]       Studies: X-ray chest PA and lateral  Result Date: 03/17/2020 CLINICAL DATA:  Shortness of breath. EXAM: CHEST - 2 VIEW COMPARISON:  08/29/2018 FINDINGS: 0454 hours. The lungs are clear without focal pneumonia, edema, pneumothorax or pleural effusion. Interstitial markings  are diffusely coarsened with chronic features. Cardiopericardial silhouette is at upper limits of normal for size. Bones are diffusely demineralized. Degenerative changes noted right shoulder. Patient is status post left reverse shoulder replacement. Telemetry leads overlie the chest. IMPRESSION: Chronic interstitial changes without acute cardiopulmonary findings. Electronically Signed   By: Misty Stanley M.D.   On: 03/17/2020 05:09    Scheduled Meds: . aspirin  81 mg Oral Daily  . atorvastatin  40 mg Oral Daily  . carbidopa-levodopa  1 tablet Oral TID WC  . carvedilol  6.25 mg Oral BID WC  . donepezil  10 mg Oral Daily  . heparin  5,000 Units Subcutaneous Q8H  . insulin aspart  0-6 Units Subcutaneous TID WC  . insulin aspart  2 Units Subcutaneous TID WC  . levETIRAcetam  1,000 mg Oral BID  . sodium chloride flush  3 mL Intravenous Once  . sodium chloride flush  3 mL Intravenous Q12H    Continuous Infusions: . sodium chloride Stopped (03/17/20 1445)  . cefTRIAXone (ROCEPHIN)  IV       LOS: 0 days     Alma Friendly, MD [facility]  If 7PM-7AM, please contact night-coverage [email or web address] 03/17/2020, 5:44 PM

## 2020-03-17 NOTE — ED Notes (Signed)
Lelon Frohlich daughter [phone] would like an update on the pt

## 2020-03-18 LAB — CBC WITH DIFFERENTIAL/PLATELET
Abs Immature Granulocytes: 0.02 10*3/uL (ref 0.00–0.07)
Basophils Absolute: 0 10*3/uL (ref 0.0–0.1)
Basophils Relative: 0 %
Eosinophils Absolute: 0 10*3/uL (ref 0.0–0.5)
Eosinophils Relative: 1 %
HCT: 36.8 % (ref 36.0–46.0)
Hemoglobin: 12 g/dL (ref 12.0–15.0)
Immature Granulocytes: 0 %
Lymphocytes Relative: 16 %
Lymphs Abs: 1 10*3/uL (ref 0.7–4.0)
MCH: 31.1 pg (ref 26.0–34.0)
MCHC: 32.6 g/dL (ref 30.0–36.0)
MCV: 95.3 fL (ref 80.0–100.0)
Monocytes Absolute: 0.4 10*3/uL (ref 0.1–1.0)
Monocytes Relative: 7 %
Neutro Abs: 4.7 10*3/uL (ref 1.7–7.7)
Neutrophils Relative %: 76 %
Platelets: 199 10*3/uL (ref 150–400)
RBC: 3.86 MIL/uL — ABNORMAL LOW (ref 3.87–5.11)
RDW: 12.6 % (ref 11.5–15.5)
WBC: 6.2 10*3/uL (ref 4.0–10.5)
nRBC: 0 % (ref 0.0–0.2)

## 2020-03-18 LAB — BASIC METABOLIC PANEL
Anion gap: 13 (ref 5–15)
BUN: 12 mg/dL (ref 8–23)
CO2: 23 mmol/L (ref 22–32)
Calcium: 9.1 mg/dL (ref 8.9–10.3)
Chloride: 101 mmol/L (ref 98–111)
Creatinine, Ser: 1.28 mg/dL — ABNORMAL HIGH (ref 0.44–1.00)
GFR calc Af Amer: 45 mL/min — ABNORMAL LOW (ref >60)
GFR calc non Af Amer: 39 mL/min — ABNORMAL LOW (ref >60)
Glucose, Bld: 276 mg/dL — ABNORMAL HIGH (ref 70–99)
Potassium: 4.1 mmol/L (ref 3.5–5.1)
Sodium: 137 mmol/L (ref 135–145)

## 2020-03-18 LAB — URINE CULTURE: Culture: >=100000 — AB

## 2020-03-18 LAB — GLUCOSE, CAPILLARY
Glucose-Capillary: 170 mg/dL — ABNORMAL HIGH (ref 70–99)
Glucose-Capillary: 166 mg/dL — ABNORMAL HIGH (ref 70–99)

## 2020-03-18 MED ORDER — CIPROFLOXACIN HCL 500 MG PO TABS: 500.0000 mg | ORAL_TABLET | Freq: Two times a day (BID) | ORAL | 0 refills | Status: AC

## 2020-03-18 NOTE — TOC Initial Note (Signed)
Transition of Care [facility]) - Initial/Assessment Note    Patient Details  Name: Martha Olsen MRN: [id number] Date of Birth: 11-17-1935  [facility] of [facility]) CM/SW Contact:    Martha Favre, RN Phone Number: 03/18/2020, 1:29 PM  Clinical Narrative:                 Martha Olsen to daughter Martha Olsen via phone. Patient from home with daughter , has all DME.   PTA patient was set up with [facility] for HHPT, however patient was admitted to hospital before first visit. Martha Olsen wants to continue with Martha Olsen.   NCM entered orders asked MD to sign. Martha Olsen with [facility] accepted referral.  Expected Discharge Plan: [place] Barriers to Discharge: Continued Medical Work up   Patient Goals and CMS Choice Patient states their goals for this hospitalization and ongoing recovery are:: to return to home CMS Medicare.gov Compare Post Acute Care list provided to:: Patient Choice offered to / list presented to : Patient, Adult Children  Expected Discharge Plan and Services Expected Discharge Plan: [place]   Discharge Planning Services: CM Consult Post Acute Care Choice: [place] arrangements for the past 2 months: Single Family Home Expected Discharge Date: 03/18/20               DME Arranged: N/A           HH Agency: [place] ([place]) Date HH Agency Contacted: 03/18/20 Time HH Agency Contacted: 73 Representative spoke with at Martha Olsen: Martha Olsen Arrangements/Services Living arrangements for the past 2 months: Martha Olsen Lives with:: Adult Children Patient language and need for interpreter reviewed:: Yes Do you feel safe going back to the place where you live?: Yes      Need for Family Participation in Patient Care: Yes (Comment) Care giver support system in place?: Yes (comment) Current home services: DME, Home PT Criminal Activity/Legal Involvement Pertinent to Current  Situation/Hospitalization: No - Comment as needed  Activities of Daily Living      Permission Sought/Granted   Permission granted to share information with : Yes, Verbal Permission Granted  Share Information with NAME: Martha Olsen daughter  Permission granted to share info w AGENCY: [place]        Emotional Assessment Appearance:: Appears stated age       Alcohol / Substance Use: Not Applicable Psych Involvement: No (comment)  Admission diagnosis:  Lower urinary tract infectious disease [N39.0] Hyponatremia [E87.1] UTI (urinary tract infection) [N39.0] SOB (shortness of breath) [R06.02] Acute cystitis without hematuria [N30.00] Generalized weakness [R53.1] Patient Active Problem List   Diagnosis Date Noted  . UTI (urinary tract infection) 03/17/2020  . Dementia with behavioral disturbance ([place]) 01/26/2020  . Fall 03/01/2015  . Pressure ulcer 03/01/2015  . Blood poisoning   . Dementia ([place])   . Essential hypertension 02/28/2015  . GERD (gastroesophageal reflux disease) 02/28/2015  . UTI (lower urinary tract infection) 02/28/2015  . Sepsis ([place]) 02/28/2015  . Acute encephalopathy 02/28/2015  . Excessive daytime sleepiness 09/03/2014  . Abnormality of gait 08/18/2014  . Urinary urgency 08/18/2014  . Sleep apnea 07/14/2014  . CAD (coronary artery disease) 12/11/2012  . Abnormal ECG 11/14/2012  . Stroke ([place])   . Hypertension   . Anxiety   . Depression   . Seizure ([place])   . Seizure disorder ([place]) 10/12/2012  . Elevated troponin I level 10/12/2012  .  Diarrhea 10/12/2012  . Pneumonia 10/12/2012  . Hypokalemia 10/12/2012  . DM (diabetes mellitus) ([place]) 10/12/2012  . HTN (hypertension) 10/12/2012  . Dyslipidemia 10/12/2012  . Rotator cuff tear arthropathy, left 12/08/2011   PCP:  Alroy Dust, L.[name], MD Pharmacy:   [facility], [place] [address] Phone: [phone] Fax:  [phone]     Social Determinants of Health (SDOH) Interventions    Readmission Risk Interventions No flowsheet data found.

## 2020-03-18 NOTE — Evaluation (Signed)
Physical Therapy Evaluation Patient Details Name: Martha Olsen MRN: [id number] DOB: [birth year] Today's Date: 03/18/2020   History of Present Illness  This 84 y.o. female admitted with UTI, hypovalemic hyponatremia.  PMH includes:  DM, seizure disorder, anxiety/depression, grade 1 diastolic dysfunction, CKDIII, HTN, CVA 6/21    Clinical Impression  Pt in bed upon arrival of PT, agreeable to evaluation at this time. Prior to admission the pt was mobilizing with a RW in the home, but able to ambulate and complete transfers to her [facility] with modI. The pt reports she was receiving some assist with ADLs such as bathing and dressing, but lives with her daughter who is able to assist 24/7. The pt now presents with limitations in functional mobility, strength, endurance, and stability due to above dx and chronically reduced activity, and will continue to benefit from skilled PT to address these deficits. The pt was able to complete all bed mobility and initial transfers without physical assist, but with minG for safety, and was able to ambulate with use of a RW for UE support without LOB. The pt will benefit from continuation of HHPT to progress functional strength and mobility to allow for improved safety with mobility in the home.      Follow Up Recommendations Home health PT;Supervision for mobility/OOB (resume HHPT)    Equipment Recommendations  None recommended by PT (pt has needed equipment)    Recommendations for Other Services       Precautions / Restrictions Precautions Precautions: Fall Precaution Comments: HR 100-115 bpm with activity Restrictions Weight Bearing Restrictions: No      Mobility  Bed Mobility Overal bed mobility: Needs Assistance Bed Mobility: Supine to Sit;Sit to Supine     Supine to sit: Supervision Sit to supine: Supervision   General bed mobility comments: supervision from flat bed, slightly increased time  Transfers Overall transfer level: Needs  assistance Equipment used: Rolling walker (2 wheeled) Transfers: Sit to/from Stand Sit to Stand: Min guard Stand pivot transfers: Min guard       General transfer comment: minG with verbal cues for hand positioning  Ambulation/Gait Ambulation/Gait assistance: Min guard Gait Distance (Feet): 100 Feet Assistive device: Rolling walker (2 wheeled) Gait Pattern/deviations: Step-through pattern;Decreased stride length;Shuffle;Trunk flexed Gait velocity: 0.44 m/s Gait velocity interpretation: <1.8 ft/sec, indicate of risk for recurrent falls General Gait Details: pt with trunk flexion and poor clearance bilaterally. no LOB during ambulation. Daughter reports the pt's gait is close to her baseline  Stairs Stairs: Yes Stairs assistance: Min assist Stair Management: One rail Right;Forwards;Step to pattern Number of Stairs: 1 (x7) General stair comments: 1 step x5, then standing rest, then x2 with R rail and minA through HHA to LUE. no LOB, pt prefers RLE leading      Balance Overall balance assessment: Mild deficits observed, not formally tested                                           Pertinent Vitals/Pain Pain Assessment: No/denies pain    Home Living Family/patient expects to be discharged to:: Private residence Living Arrangements: Children Available Help at Discharge: Family;Available 24 hours/day Type of Home: House Home Access: Stairs to enter Entrance Stairs-Rails: Psychiatric nurse of Steps: 7 Home Layout: One level Home Equipment: Walker - 2 wheels;Shower seat;Bedside commode;Wheelchair - manual;Hospital bed Additional Comments: lives with daughter     Prior Function Level  of Independence: Needs assistance   Gait / Transfers Assistance Needed: walks mod I with RW and was able to negotiate stairs into house with min A   ADL's / Homemaking Assistance Needed: Pt required mod - max A for bathing and dressing.  She was able to toilet  mod I.         Hand Dominance   Dominant Hand: Right    Extremity/Trunk Assessment   Upper Extremity Assessment Upper Extremity Assessment: Defer to OT evaluation    Lower Extremity Assessment Lower Extremity Assessment: Generalized weakness (difficulty maintaining against minimal resistance for MMT, no difference between BLE)    Cervical / Trunk Assessment Cervical / Trunk Assessment: Normal  Communication   Communication: No difficulties  Cognition Arousal/Alertness: Awake/alert Behavior During Therapy: WFL for tasks assessed/performed Overall Cognitive Status: History of cognitive impairments - at baseline                                        General Comments General comments (skin integrity, edema, etc.): daughter present and supportive. sleeps in the same room as pt    Exercises     Assessment/Plan    PT Assessment Patient needs continued PT services  PT Problem List Decreased strength;Decreased mobility;Decreased activity tolerance;Decreased balance       PT Treatment Interventions DME instruction;Therapeutic exercise;Gait training;Stair training;Functional mobility training;Therapeutic activities;Patient/family education;Balance training    PT Goals (Current goals can be found in the Care Plan section)  Acute Rehab PT Goals Patient Stated Goal: to go back home  PT Goal Formulation: With patient Time For Goal Achievement: 04/01/20 Potential to Achieve Goals: Good    Frequency Min 3X/week    AM-PAC PT "6 Clicks" Mobility  Outcome Measure Help needed turning from your back to your side while in a flat bed without using bedrails?: None Help needed moving from lying on your back to sitting on the side of a flat bed without using bedrails?: None Help needed moving to and from a bed to a chair (including a wheelchair)?: A Little Help needed standing up from a chair using your arms (e.g., wheelchair or bedside chair)?: A Little Help needed  to walk in hospital room?: A Little Help needed climbing 3-5 steps with a railing? : A Lot 6 Click Score: 19    End of Session Equipment Utilized During Treatment: Gait belt Activity Tolerance: Patient tolerated treatment well;No increased pain Patient left: in bed;with call bell/phone within reach;with family/visitor present (no recliner in room) Nurse Communication: Mobility status PT Visit Diagnosis: Difficulty in walking, not elsewhere classified (R26.2);Muscle weakness (generalized) (M62.81)    Time: 5053-9767 PT Time Calculation (min) (ACUTE ONLY): 20 min   Charges:   PT Evaluation $PT Eval Low Complexity: 1 Low          Karma Ganja, PT, DPT   Acute Rehabilitation Department Pager #: [phone]  Otho Bellows 03/18/2020, 11:15 AM

## 2020-03-18 NOTE — Discharge Summary (Signed)
Discharge Summary  Martha Olsen VCB:[id number] DOB: Apr 28, 1936  PCP: Alroy Dust, L.[name], MD  Admit date: 03/16/2020 Discharge date: 03/18/2020  Time spent: 40 mins  Recommendations for Outpatient Follow-up:  1. PCP in 1 week  Discharge Diagnoses:  Active Hospital Problems   Diagnosis Date Noted  . UTI (urinary tract infection) 03/17/2020    Resolved Hospital Problems  No resolved problems to display.    Discharge Condition: Stable  Diet recommendation: Moderate carb/heart healthy  Vitals:   03/18/20 0425 03/18/20 1204  BP: (!) 157/79 (!) 141/51  Pulse: 92 78  Resp: 20 18  Temp: 98.7 F (37.1 C) 98.6 F (37 C)  SpO2: 97% 99%    History of present illness:  Martha Duignan Albertyis a 84 y.o.femalewith medical history significant ofanxiety depression,DMII, grade1 diastolic dysfunction,dementia,CKDIIIA HTN, SZ d/o,CVA 6/21 s/p rehab stay with d/c 03/14/20 who presents from home with generalized weakness. Per son not eating or drinking at home for the last 48 hours. He also noted that she was ambulatory with walker at few days ago but in the last 24-48 hours has not been able to ambulate with walker due to generalized weakness.Patient gives same history and states she had no n/v/abdominal pain /dysuria/or fever/chills /cough of chest pain. She states she just felt extremely fatigued and notes she felt well initially on arriving home. ED Course:T:99.3, hr 127/71, hr 80, rr 18 sat 96% on ra. Labs Wbc:11.8, hgb12.3 at baseline, NA:124, cr 1.21 at baseline, Covid:negative, UA:+bacteria,+ LE, wbc 11-20.  Admitted for further management.    Today, patient denies any new complaints, reports feeling much better.  Patient denies any chest pain, abdominal pain, nausea/vomiting, fever/chills.  PT/OT recommended home health PT to be continued.  Discussed discharge plans extensively with daughter at bedside.   Hospital Course:  Active Problems:   UTI (urinary tract  infection)  UTI Currently afebrile, with no leukocytosis UA showed positive bacteria, leukocytes, WBC 11-20 UC grew greater than 100,000 Citrobacter freundii, sensitive to ceftriaxone but resistant to cefazolin and Bactrim. Sensitive to ciprofloxacin.  Discussed with pharmacy, will discharge on ciprofloxacin to complete 7 days of antibiotics as this would only be the choice available for the patient Follow-up with PCP  Diabetes mellitus type 2 A1c on 03/17/2020 was 5.5 Continue home regimen  CKD stage IIIa Creatinine at baseline Follow-up with PCP  Hypertension Continue Norvasc, Coreg  History of chronic diastolic dysfunction Stable, monitor closely  CVA Continue aspirin, Lipitor  Parkinson's disease Continue Sinemet  History of seizure Continue Keppra  Debility Home health PT/OT  Dementia Continue Aricept  Obesity Lifestyle modification advised       Malnutrition Type:      Malnutrition Characteristics:      Nutrition Interventions:      Estimated body mass index is 31.83 kg/m as calculated from the following:   Height as of this encounter: 5\' 3"  (1.6 m).   Weight as of this encounter: 81.5 kg.    Procedures:  None  Consultations:  None  Discharge Exam: BP (!) 141/51 (BP Location: Right Arm)   Pulse 78   Temp 98.6 F (37 C) (Oral)   Resp 18   Ht 5\' 3"  (1.6 m)   Wt 81.5 kg   SpO2 99%   BMI 31.83 kg/m   General: NAD Cardiovascular: S1, S2 present Respiratory: CTA B    Discharge Instructions You were cared for by a hospitalist during your hospital stay. If you have any questions about your discharge medications or the  care you received while you were in the hospital after you are discharged, you can call the unit and asked to speak with the hospitalist on call if the hospitalist that took care of you is not available. Once you are discharged, your primary care physician will handle any further medical issues. Please note  that NO REFILLS for any discharge medications will be authorized once you are discharged, as it is imperative that you return to your primary care physician (or establish a relationship with a primary care physician if you do not have one) for your aftercare needs so that they can reassess your need for medications and monitor your lab values.  Discharge Instructions    Diet - low sodium heart healthy   Complete by: As directed    Increase activity slowly   Complete by: As directed      Allergies as of 03/18/2020      Reactions   Codeine Hives      Medication List    TAKE these medications   acetaminophen 500 MG tablet Commonly known as: TYLENOL Take 500 mg by mouth every 6 (six) hours as needed for mild pain or headache.   amLODipine 2.5 MG tablet Commonly known as: NORVASC Take 2.5 mg by mouth daily.   ARIPiprazole 5 MG tablet Commonly known as: ABILIFY Take 5 mg by mouth daily.   aspirin 81 MG chewable tablet Chew 1 tablet (81 mg total) by mouth daily.   atorvastatin 40 MG tablet Commonly known as: LIPITOR Take 40 mg by mouth daily.   carbidopa-levodopa 25-100 MG tablet Commonly known as: SINEMET IR Take 1 tablet by mouth 3 (three) times daily.   carvedilol 6.25 MG tablet Commonly known as: COREG Take 1 tablet (6.25 mg total) by mouth 2 (two) times daily with a meal.   ciprofloxacin 500 MG tablet Commonly known as: Cipro Take 1 tablet (500 mg total) by mouth 2 (two) times daily for 4 days. Start taking on: March 19, 2020   donepezil 10 MG tablet Commonly known as: ARICEPT Take 10 mg by mouth daily.   gabapentin 300 MG capsule Commonly known as: NEURONTIN Take 600 mg by mouth daily.   glimepiride 2 MG tablet Commonly known as: AMARYL Take 2 mg by mouth daily before breakfast.   levETIRAcetam 500 MG tablet Commonly known as: KEPPRA Take 2 tablets (1,000 mg total) by mouth 2 (two) times daily.   memantine 10 MG tablet Commonly known as: NAMENDA Take  10 mg by mouth 2 (two) times daily.   mirtazapine 15 MG tablet Commonly known as: REMERON Take 15 mg by mouth at bedtime.   multivitamin capsule Take 1 capsule by mouth daily.   nitroGLYCERIN 0.4 MG SL tablet Commonly known as: NITROSTAT Place 1 tablet (0.4 mg total) under the tongue every 5 (five) minutes as needed for chest pain.   repaglinide 0.5 MG tablet Commonly known as: PRANDIN Take 0.5 mg by mouth 3 (three) times daily.   sertraline 50 MG tablet Commonly known as: ZOLOFT TAKE 1&1/2 TABLETS BY MOUTH DAILY What changed: See the new instructions.   trimethoprim 100 MG tablet Commonly known as: TRIMPEX Take 100 mg by mouth 2 (two) times daily.   Viibryd 40 MG Tabs Generic drug: Vilazodone HCl Take 40 mg by mouth daily.      Allergies  Allergen Reactions  . Codeine Hives    Follow-up Information    Alroy Dust, L.[name], MD. Schedule an appointment as soon as possible for a visit  in 1 week(s).   Specialty: Family Medicine Contact information: [address] [place] [address] [phone]                The results of significant diagnostics from this hospitalization (including imaging, microbiology, ancillary and laboratory) are listed below for reference.    Significant Diagnostic Studies: X-ray chest PA and lateral  Result Date: 03/17/2020 CLINICAL DATA:  Shortness of breath. EXAM: CHEST - 2 VIEW COMPARISON:  08/29/2018 FINDINGS: 0454 hours. The lungs are clear without focal pneumonia, edema, pneumothorax or pleural effusion. Interstitial markings are diffusely coarsened with chronic features. Cardiopericardial silhouette is at upper limits of normal for size. Bones are diffusely demineralized. Degenerative changes noted right shoulder. Patient is status post left reverse shoulder replacement. Telemetry leads overlie the chest. IMPRESSION: Chronic interstitial changes without acute cardiopulmonary findings. Electronically Signed   By: Misty Stanley M.D.   On: 03/17/2020 05:09   CT HEAD WO CONTRAST  Result Date: 03/16/2020 CLINICAL DATA:  Generalized weakness, recent discharge from rehab facility following admission for stroke in July EXAM: CT HEAD WITHOUT CONTRAST TECHNIQUE: Contiguous axial images were obtained from the base of the skull through the vertex without intravenous contrast. COMPARISON:  MRI and CT 08/29/2018 FINDINGS: Brain: Stable region of encephalomalacia is seen involving the right corona radiata, basal ganglia and thalamus. Some asymmetric ex vacuo dilatation of the right ventricle is present on a background of more mild diffuse parenchymal volume loss. No new CT evident areas of infarct. No evidence of acute hemorrhage, hydrocephalus, extra-axial collection or mass lesion/mass effect. Patchy areas of white matter hypoattenuation are most compatible with chronic microvascular angiopathy. Vascular: Atherosclerotic calcification of the carotid siphons. No hyperdense vessel. Skull: Hyperostosis frontalis interna, a benign often senescent incidental finding. No calvarial fracture or suspicious osseous lesion. No scalp swelling or hematoma. Sinuses/Orbits: Trace right mastoid effusion with few opacified dependent air cells. Paranasal sinuses and mastoid air cells are otherwise predominantly clear. Bilateral lens replacements. Other: None. IMPRESSION: 1. No acute intracranial abnormality. 2. Stable region of encephalomalacia involving the right corona radiata, basal ganglia, and thalamus. 3. Stable chronic microvascular angiopathy and parenchymal volume loss. 4. Chronic right mastoid effusion. Electronically Signed   By: Lovena Le M.D.   On: 03/16/2020 15:23    Microbiology: Recent Results (from the past 240 hour(s))  SARS Coronavirus 2 by RT PCR (hospital order, performed in [facility] hospital lab) Nasopharyngeal Nasopharyngeal Swab     Status: None   Collection Time: 03/16/20  9:45 PM   Specimen: Nasopharyngeal Swab   Result Value Ref Range Status   SARS Coronavirus 2 NEGATIVE NEGATIVE Final    Comment: (NOTE) SARS-CoV-2 target nucleic acids are NOT DETECTED.  The SARS-CoV-2 RNA is generally detectable in upper and lower respiratory specimens during the acute phase of infection. The lowest concentration of SARS-CoV-2 viral copies this assay can detect is 250 copies / mL. A negative result does not preclude SARS-CoV-2 infection and should not be used as the sole basis for treatment or other patient management decisions.  A negative result may occur with improper specimen collection / handling, submission of specimen other than nasopharyngeal swab, presence of viral mutation(s) within the areas targeted by this assay, and inadequate number of viral copies (<250 copies / mL). A negative result must be combined with clinical observations, patient history, and epidemiological information.  Fact Sheet for Patients:   StrictlyIdeas.no  Fact Sheet for Healthcare Providers: BankingDealers.co.za  This test is not  yet approved or  cleared by the [place] and has been authorized for detection and/or diagnosis of SARS-CoV-2 by FDA under an Emergency Use Authorization (EUA).  This EUA will remain in effect (meaning this test can be used) for the duration of the COVID-19 declaration under Section 564(b)(1) of the Act, 21 U.S.C. section 360bbb-3(b)(1), unless the authorization is terminated or revoked sooner.  Performed at [facility], [place] [address] [place], [place] [address]   Urine culture     Status: Abnormal   Collection Time: 03/16/20  9:57 PM   Specimen: Urine, Random  Result Value Ref Range Status   Specimen Description URINE, RANDOM  Final   Special Requests   Final    NONE Performed at [facility], [place] [address] [place], [place] [address]    Culture >=100,000 COLONIES/mL CITROBACTER FREUNDII (A)  Final   Report Status  03/18/2020 FINAL  Final   Organism ID, Bacteria CITROBACTER FREUNDII (A)  Final      Susceptibility   Citrobacter freundii - MIC*    CEFAZOLIN >=64 RESISTANT Resistant     CEFTRIAXONE <=0.25 SENSITIVE Sensitive     CIPROFLOXACIN <=0.25 SENSITIVE Sensitive     GENTAMICIN <=1 SENSITIVE Sensitive     IMIPENEM <=0.25 SENSITIVE Sensitive     NITROFURANTOIN 32 SENSITIVE Sensitive     TRIMETH/SULFA >=320 RESISTANT Resistant     PIP/TAZO <=4 SENSITIVE Sensitive     * >=100,000 COLONIES/mL CITROBACTER FREUNDII     Labs: Basic Metabolic Panel: Recent Labs  Lab 03/16/20 1408 03/17/20 0637 03/18/20 0725  NA 124* 134* 137  K 4.5 4.9 4.1  CL 90* 105 101  CO2 22 20* 23  GLUCOSE 99 85 276*  BUN 16 10 12   CREATININE 1.21* 0.86 1.28*  CALCIUM 8.5* 7.9* 9.1   Liver Function Tests: Recent Labs  Lab 03/16/20 1408 03/17/20 0637  AST 21 32  ALT 6 12  ALKPHOS 78 69  BILITOT 0.8 1.8*  PROT 6.4* 5.8*  ALBUMIN 3.4* 3.0*   No results for input(s): LIPASE, AMYLASE in the last 168 hours. No results for input(s): AMMONIA in the last 168 hours. CBC: Recent Labs  Lab 03/16/20 1408 03/17/20 0457 03/18/20 0725  WBC 11.8* 9.5 6.2  NEUTROABS 8.2* 6.3 4.7  HGB 12.3 12.6 12.0  HCT 38.2 38.1 36.8  MCV 98.2 95.3 95.3  PLT 231 208 199   Cardiac Enzymes: No results for input(s): CKTOTAL, CKMB, CKMBINDEX, TROPONINI in the last 168 hours. BNP: BNP (last 3 results) Recent Labs    03/17/20 0457  BNP 61.9    ProBNP (last 3 results) No results for input(s): PROBNP in the last 8760 hours.  CBG: Recent Labs  Lab 03/17/20 1220 03/17/20 1606 03/17/20 2052 03/18/20 0608 03/18/20 1129  GLUCAP 100* 97 112* 170* 166*       Signed:  Alma Friendly, MD [facility] 03/18/2020, 1:12 PM

## 2020-03-18 NOTE — Progress Notes (Signed)
Pt refusing tele. Md made aware.

## 2020-03-18 NOTE — Evaluation (Signed)
Occupational Therapy Evaluation Patient Details Name: Martha Olsen MRN: [id number] DOB: 05/14/1936 Today's Date: 03/18/2020    History of Present Illness This 84 y.o. female admitted with UTI, hypovalemic hyponatremia.  PMH includes:  DM, seizure disorder, anxiety/depression, grade 1 diastolic dysfunction, CKDIII, HTN, CVA 6/21   Clinical Impression   Patient evaluated by Occupational Therapy with no further acute OT needs identified. All education has been completed and the patient has no further questions. Pt appears to be approaching her baseline status with ADLs - daughter present to confirm.  She typically requires mod - max A for ADLs and ambulates with RW.  She lives with daughter, who provides 24 hour assist.  See below for any follow-up Occupational Therapy or equipment needs. OT is signing off. Thank you for this referral.      Follow Up Recommendations  No OT follow up;Supervision/Assistance - 24 hour    Equipment Recommendations  None recommended by OT    Recommendations for Other Services       Precautions / Restrictions Precautions Precautions: Fall Restrictions Weight Bearing Restrictions: No      Mobility Bed Mobility Overal bed mobility: Needs Assistance Bed Mobility: Supine to Sit;Sit to Supine     Supine to sit: Supervision Sit to supine: Supervision      Transfers Overall transfer level: Needs assistance Equipment used: Rolling walker (2 wheeled) Transfers: Sit to/from [facility] Sit to Stand: Min guard Stand pivot transfers: Min guard       General transfer comment: min guard for safety.      Balance Overall balance assessment: Mild deficits observed, not formally tested                                         ADL either performed or assessed with clinical judgement   ADL Overall ADL's : At baseline                                       General ADL Comments: was able to  complete grooming at sink with min guard assist.  She is approximating her baseline, per daughter      Vision Patient Visual Report: No change from baseline       Perception     Praxis      Pertinent Vitals/Pain       Hand Dominance Right   Extremity/Trunk Assessment Upper Extremity Assessment Upper Extremity Assessment: Overall WFL for tasks assessed (limited shoulder elevation due to surgery, but functional )   Lower Extremity Assessment Lower Extremity Assessment: Defer to PT evaluation       Communication Communication Communication: No difficulties   Cognition Arousal/Alertness: Awake/alert Behavior During Therapy: WFL for tasks assessed/performed Overall Cognitive Status: History of cognitive impairments - at baseline                                     General Comments  daughter present     Exercises     Shoulder Instructions      Home Living Family/patient expects to be discharged to:: Private residence Living Arrangements: Children Available Help at Discharge: Family;Available 24 hours/day Type of Home: House Home Access: Stairs to enter [employer] of Steps: 7 Entrance Stairs-Rails:  Right Home Layout: One level     Bathroom Shower/Tub: Occupational psychologist: Handicapped height     Home Equipment: Environmental consultant - 2 wheels;Shower seat;Bedside commode;Wheelchair - manual;Hospital bed   Additional Comments: lives with daughter       Prior Functioning/Environment Level of Independence: Needs assistance  Gait / Transfers Assistance Needed: walks mod I with RW and was able to negotiate stairs into house with min A  ADL's / Homemaking Assistance Needed: Pt required mod - max A for bathing and dressing.  She was able to toilet mod I.             OT Problem List: Decreased strength;Decreased activity tolerance;Impaired balance (sitting and/or standing);Decreased cognition      OT Treatment/Interventions:       OT Goals(Current goals can be found in the care plan section) Acute Rehab OT Goals Patient Stated Goal: to go back home  OT Goal Formulation: All assessment and education complete, DC therapy  OT Frequency:     Barriers to D/C:            Co-evaluation              AM-PAC OT "6 Clicks" Daily Activity     Outcome Measure Help from another person eating meals?: None Help from another person taking care of personal grooming?: A Little Help from another person toileting, which includes using toliet, bedpan, or urinal?: A Little Help from another person bathing (including washing, rinsing, drying)?: A Little Help from another person to put on and taking off regular upper body clothing?: A Little Help from another person to put on and taking off regular lower body clothing?: A Little 6 Click Score: 19   End of Session Equipment Utilized During Treatment: Rolling walker Nurse Communication: Mobility status  Activity Tolerance: Patient tolerated treatment well Patient left: in bed;with call bell/phone within reach;with bed alarm set;with family/visitor present  OT Visit Diagnosis: Unsteadiness on feet (R26.81);Cognitive communication deficit (R41.841)                Time: 1914-7829 OT Time Calculation (min): 15 min Charges:  OT General Charges $OT Visit: 1 Visit OT Evaluation $OT Eval Moderate Complexity: 1 Mod  Nilsa Nutting., OTR/L Acute Rehabilitation Services Pager [phone] Office [place], [place] 03/18/2020, 10:38 AM

## 2020-03-23 ENCOUNTER — Telehealth: Payer: Self-pay | Admitting: Neurology

## 2020-03-23 NOTE — Telephone Encounter (Signed)
Pt is needing a refill on her carbidopa-levodopa (SINEMET IR) 25-100 MG tablet sent in to [facility]

## 2020-03-23 NOTE — Telephone Encounter (Signed)
30-day rx x 11 refills sent 11/14/19 to [facility]. I returned call to [facility] to let her know she can contact the pharmacy to request the refill.

## 2020-03-30 DIAGNOSIS — I10 Essential (primary) hypertension: Secondary | ICD-10-CM | POA: Diagnosis not present

## 2020-03-30 DIAGNOSIS — N390 Urinary tract infection, site not specified: Secondary | ICD-10-CM | POA: Diagnosis not present

## 2020-03-30 DIAGNOSIS — E1142 Type 2 diabetes mellitus with diabetic polyneuropathy: Secondary | ICD-10-CM | POA: Diagnosis not present

## 2020-03-30 DIAGNOSIS — E7800 Pure hypercholesterolemia, unspecified: Secondary | ICD-10-CM | POA: Diagnosis not present

## 2020-04-07 DIAGNOSIS — F5101 Primary insomnia: Secondary | ICD-10-CM | POA: Diagnosis not present

## 2020-04-07 DIAGNOSIS — F331 Major depressive disorder, recurrent, moderate: Secondary | ICD-10-CM | POA: Diagnosis not present

## 2020-04-07 DIAGNOSIS — F411 Generalized anxiety disorder: Secondary | ICD-10-CM | POA: Diagnosis not present

## 2020-04-07 DIAGNOSIS — F0280 Dementia in other diseases classified elsewhere without behavioral disturbance: Secondary | ICD-10-CM | POA: Diagnosis not present

## 2020-04-18 DIAGNOSIS — N390 Urinary tract infection, site not specified: Secondary | ICD-10-CM | POA: Diagnosis not present

## 2020-04-18 DIAGNOSIS — F419 Anxiety disorder, unspecified: Secondary | ICD-10-CM | POA: Diagnosis not present

## 2020-04-18 DIAGNOSIS — G20 Parkinson's disease: Secondary | ICD-10-CM | POA: Diagnosis not present

## 2020-04-18 DIAGNOSIS — I130 Hypertensive heart and chronic kidney disease with heart failure and stage 1 through stage 4 chronic kidney disease, or unspecified chronic kidney disease: Secondary | ICD-10-CM | POA: Diagnosis not present

## 2020-04-18 DIAGNOSIS — E669 Obesity, unspecified: Secondary | ICD-10-CM | POA: Diagnosis not present

## 2020-04-18 DIAGNOSIS — B9689 Other specified bacterial agents as the cause of diseases classified elsewhere: Secondary | ICD-10-CM | POA: Diagnosis not present

## 2020-04-18 DIAGNOSIS — F0280 Dementia in other diseases classified elsewhere without behavioral disturbance: Secondary | ICD-10-CM | POA: Diagnosis not present

## 2020-04-18 DIAGNOSIS — Z7984 Long term (current) use of oral hypoglycemic drugs: Secondary | ICD-10-CM | POA: Diagnosis not present

## 2020-04-18 DIAGNOSIS — F329 Major depressive disorder, single episode, unspecified: Secondary | ICD-10-CM | POA: Diagnosis not present

## 2020-04-18 DIAGNOSIS — Z6831 Body mass index (BMI) 31.0-31.9, adult: Secondary | ICD-10-CM | POA: Diagnosis not present

## 2020-04-18 DIAGNOSIS — H9190 Unspecified hearing loss, unspecified ear: Secondary | ICD-10-CM | POA: Diagnosis not present

## 2020-04-18 DIAGNOSIS — I5032 Chronic diastolic (congestive) heart failure: Secondary | ICD-10-CM | POA: Diagnosis not present

## 2020-04-18 DIAGNOSIS — E1122 Type 2 diabetes mellitus with diabetic chronic kidney disease: Secondary | ICD-10-CM | POA: Diagnosis not present

## 2020-04-18 DIAGNOSIS — I69351 Hemiplegia and hemiparesis following cerebral infarction affecting right dominant side: Secondary | ICD-10-CM | POA: Diagnosis not present

## 2020-04-18 DIAGNOSIS — N1831 Chronic kidney disease, stage 3a: Secondary | ICD-10-CM | POA: Diagnosis not present

## 2020-04-18 DIAGNOSIS — G40909 Epilepsy, unspecified, not intractable, without status epilepticus: Secondary | ICD-10-CM | POA: Diagnosis not present

## 2020-04-29 ENCOUNTER — Other Ambulatory Visit: Payer: Self-pay | Admitting: Neurology

## 2020-05-31 ENCOUNTER — Telehealth: Payer: Self-pay | Admitting: Neurology

## 2020-05-31 ENCOUNTER — Other Ambulatory Visit: Payer: Self-pay | Admitting: Neurology

## 2020-05-31 NOTE — Telephone Encounter (Signed)
Pt's daughter, Martha Olsen (on [place]) called, Dr. [name] change the dosage for levETIRAcetam (KEPPRA) 500 MG tablet to 2 tablets am and 2 tablets pm. The prescription at the pharmacy do not reflect the change in prescription. Based on the previous prescription she will not have enough to last 90 days. Would like a call from the nurse.

## 2020-06-01 ENCOUNTER — Other Ambulatory Visit: Payer: Self-pay | Admitting: *Deleted

## 2020-06-01 MED ORDER — LEVETIRACETAM 500 MG PO TABS: ORAL_TABLET | ORAL | 0 refills | Status: DC

## 2020-06-01 NOTE — Telephone Encounter (Addendum)
Reviewed Dr. Rhea Belton last OV note. She stated: "She is now on a higher dose of Keppra 500 mg 2 tablets 2 times a day". I e-scribed updated prescription the pharmacy for pt. Called and LVM for pt to let her know we have sent in updated prescription for her.

## 2020-06-30 DIAGNOSIS — F331 Major depressive disorder, recurrent, moderate: Secondary | ICD-10-CM | POA: Diagnosis not present

## 2020-06-30 DIAGNOSIS — F5101 Primary insomnia: Secondary | ICD-10-CM | POA: Diagnosis not present

## 2020-06-30 DIAGNOSIS — F411 Generalized anxiety disorder: Secondary | ICD-10-CM | POA: Diagnosis not present

## 2020-06-30 DIAGNOSIS — F0280 Dementia in other diseases classified elsewhere without behavioral disturbance: Secondary | ICD-10-CM | POA: Diagnosis not present

## 2020-07-27 NOTE — Progress Notes (Signed)
PATIENT: Martha Olsen DOB: [birth year]  REASON FOR VISIT: follow up HISTORY FROM: patient  HISTORY OF PRESENT ILLNESS: Today 07/28/20  HISTORY  Suhaylah S Solano,seen in refer by her primary care physician Dr. Donavan Burnet for evaluation of memory loss.    She has past medical history of HTN, s/p CVA right basal ganglia 11/2001, dyslipidemia, DM, anxiety/depression, s/p appendectomy, s/p hysterectomy, DJD, s/p left hip replacement 03/2002, left knee replacement 01/2011, foot surgery and reverse left shoulder arthroplasty 11/2011.  She had 9 years of education, she was a Chartered certified accountant, she worked at [employer], [employer], retried at age [place] care of her grandchildren. She denies family history of memory loss. Mother died at age 24 from stroke.  I saw her initially following her hospital discharge in March 2013,  In October 13 2011, she was at home, sitting at the rocking chair, was noted by her family fell over the chair, had generalized tonic-clonic seizure, patient has no warning signs, no collection of the event, EMS was called, she was taken by ambulance to the hospital, she could not remember waking up in the hospital confused, she was put on Keppra 500 mg twice a day, tolerating it well, there was no recurrent seizure.  EEG was normal. MRI of the brainin 2013has demonstrated old basal ganglion, and coronal radiata stroke, no acute lesions,  She complains of headache everyday, right temporal regions, light noise is bothersome,   She has quit driving since [age], husband retired, she is active at home, house work  She has no recurrent seizures, tolerating Keppra 500 mg twice a day without significant side effect, she has increased gait difficulty because of joints, knee pain, She has been complaining of a year history of worsening headaches, bilateral frontal, vortex region, she has to take ibuprofen 200 mg 2-3 tablets each time, sometimes two dosage each  day  She was given prescription of tramadol 50 mg as neededinDecember 2015, her headache has much improved, taking tramadol about twice each week, she continue has bilateral knee pain, low back pain, gait difficulty, she also has nocturia, frequent awakening, loss snoring, excessive daytime sleepiness, fatigue, sleep studies pending in August 25 2014  ESR, TSH was normal December 2015, mild elevated C-reactive protein 11.  UPDATE February 18 2015: She is accompanied by her husband, and daughter at today's clinical visit,  She had a urgent visit with Dr. Jaynee Eagles in February 09 2015 because of increased confusion, was found to have UTI,she was given prescription of Augmentin for 5 days, there was no significant change in her confusion  February 09 2015, EEG recording asymmetric slowing of the left hemisphere, with occasionally sharp transient  Patient's continue have slow worsening confusion, agitation, paranoid ideation, difficulty sleeping at nighttime, She had 10 years of education, worked two jobs most of her life, she retired at age 69, was very active until she had stroke in 2013, was noted to have declining functional status, increased memory trouble, especially since January 2016,  She also has worsening gait difficulty,worsening right knee pain, had a recent evaluation by orthopedic surgeon,she is not a candidate for right knee replacement anymore,because of worsening memory trouble, gradually increased confusion  She also complains of low back pain,worsening bowel and bladder incontinence Update May 11 2015: Her confusion continues with getting worse, she is more agitated, could not well at night time, paranoid, scared, seroquel has been helpful, she is now taking 25 mg 1 in the morning, 1 at 2:00, 2 tablets  at night  She is on polypharmacy treatment including Paxil, BuSpar, Ativan as needed, She has no recurrent seizure, taking Keppra 500 mg/1000 mg  UPDATE Aug 11 2015: She is  now taking seroquel $RemoveBeforeD'25mg'QmQPdNwJHnmBiv$  3 tabs qhs,ativan prn, but she still has trouble sleeping. She is seeing psychiatrist, taking BuSpar, she has been dealing with depression for a long time. She was previously treated as SSRI, which has been helpful  She has no recurrent seizure, taking Keppra 500/1000 mg daily   UPDATE Jan 10th 2018: She is with her husband at today's clinical visit, she has been doing very well over the past 6 months, tolerating current medications, does not want to make any changes, no recurrent seizure, on Keppra 500/1000  UPDATE November 14 2019: She is with her daughter at today's clinical visit, since her husband passed away 3 years ago, she has moved in with her daughter, overall she is stable, was noted to have increased gait abnormality, rely on her walker for short distance at home, recently received physical therapy, she is happy about her current medications, no recurrent seizure, is calm most of the time, sleeps well, has good appetite,  UPDATE January 26 2020: This is an early than expected visit, she and her family went on vacation on January 22, 2019, next morning she had a recurrent seizure, weakness by her family, after a bite of hamburger, she was noted to have body shaking, foaming, out of her mouth, lasting for few minutes,  She was treated at local hospital, [facility], I reviewed hospital discharge on January 24, 2020, CT of the head without contrast no acute abnormality, MRI of the brain without contrast, no evidence of acute infarction, supratentorium small vessel disease, chronic lacunar infarction at the right basal ganglion, mild generalized atrophy Ultrasound of carotid artery, incidental finding of left thyroid nodule measuring 1.7 x 1.1 x 1.8 cm, left internal carotid artery 50 to 69% stenosis, no significant right internal carotid artery stenosis, bilateral vertebral artery with antegrade flow  Echocardiogram technically difficult study, no  significant abnormality, ejection fraction 60 to 65%  EKG, sinus rhythm with premature atrial complex,  Keppra was increased from 500/1000milligram to 1000 mg twice a day, she tolerated the medication well, but does noted to have excessive fatigue, sleepiness, increased gait abnormality, she was started on sentiment at last visit in April 2021, she tolerated the medication well, mildly mild improvement in her gait, both patient and her family hope to have her temporary placement at [facility] for rehabilitation, her older son lives in the same facility, paperwork was completed Address: [address] [place], [place] [address] Phone: [phone]  Update July 28, 2020 SS: When last seen Dr. [name], went to 30 days rehab, then home PT 6-8 weeks.  Much improvement.  Also improved with Sinemet.  Walking is more stable, more confident.  Less tremor, was noted in hands.  In wheelchair prior, now using walker.  Lives with her daughter.  Requires assistance with ADLs.  No recurrent seizures.  Seeing psychiatrist in the next several weeks.  Has a trip planned to [place].  Here with her daughter, overall quite improved, wanted to show Dr. [name] her progress, Dr. [name] was able to briefly see her as well.  Remains off Aricept and Namenda.  She was hospitalized in August 2021 for generalized weakness, found to have UTI, was discharged home.  REVIEW OF SYSTEMS: Out of a complete 14 system review of symptoms, the patient complains  only of the following symptoms, and all other reviewed systems are negative.  Walking difficulty  ALLERGIES: Allergies  Allergen Reactions  . Codeine Hives    HOME MEDICATIONS: Outpatient Medications Prior to Visit  Medication Sig Dispense Refill  . acetaminophen (TYLENOL) 500 MG tablet Take 500 mg by mouth every 6 (six) hours as needed for mild pain or headache.    Marland Kitchen amLODipine (NORVASC) 2.5 MG tablet Take 2.5 mg by mouth daily.    . ARIPiprazole (ABILIFY) 5 MG tablet Take 5  mg by mouth daily.     Marland Kitchen aspirin 81 MG chewable tablet Chew 1 tablet (81 mg total) by mouth daily. 30 tablet 2  . atorvastatin (LIPITOR) 40 MG tablet Take 40 mg by mouth daily.    . carbidopa-levodopa (SINEMET IR) 25-100 MG tablet Take 1 tablet by mouth 3 (three) times daily. 90 tablet 11  . carvedilol (COREG) 6.25 MG tablet Take 1 tablet (6.25 mg total) by mouth 2 (two) times daily with a meal. 180 tablet 3  . donepezil (ARICEPT) 10 MG tablet Take 10 mg by mouth daily.    Marland Kitchen gabapentin (NEURONTIN) 300 MG capsule Take 600 mg by mouth daily.     Marland Kitchen glimepiride (AMARYL) 2 MG tablet Take 2 mg by mouth daily before breakfast.    . levETIRAcetam (KEPPRA) 500 MG tablet TAKE 2 TABLETS BY MOUTH EVERY MORNING AND 2 TABLETS EVERY NIGHT 360 tablet 0  . memantine (NAMENDA) 10 MG tablet Take 10 mg by mouth 2 (two) times daily.    . mirtazapine (REMERON) 15 MG tablet Take 15 mg by mouth at bedtime.    . Multiple Vitamin (MULTIVITAMIN) capsule Take 1 capsule by mouth daily.    . repaglinide (PRANDIN) 0.5 MG tablet Take 0.5 mg by mouth 3 (three) times daily.    . sertraline (ZOLOFT) 50 MG tablet TAKE 1&1/2 TABLETS BY MOUTH DAILY (Patient taking differently: Take 50 mg by mouth daily.) 135 tablet 1  . trimethoprim (TRIMPEX) 100 MG tablet Take 100 mg by mouth 2 (two) times daily.     Marland Kitchen VIIBRYD 40 MG TABS Take 40 mg by mouth daily.     . nitroGLYCERIN (NITROSTAT) 0.4 MG SL tablet Place 1 tablet (0.4 mg total) under the tongue every 5 (five) minutes as needed for chest pain. (Patient not taking: Reported on 03/18/2020) 25 tablet 3   No facility-administered medications prior to visit.    PAST MEDICAL HISTORY: Past Medical History:  Diagnosis Date  . Anxiety   . Dementia ([place])   . Depression   . Diabetes mellitus 2011  . Headache(784.0)   . History of echocardiogram    Echo 5/16: EF 55-60%, normal wall motion, grade 1 diastolic dysfunction  . Hypertension   . Seizure ([place])   . Stroke [facility]) 2002   right side  weakness  . UTI (urinary tract infection) 02/2015    PAST SURGICAL HISTORY: Past Surgical History:  Procedure Laterality Date  . ABDOMINAL HYSTERECTOMY  1980's  . APPENDECTOMY  1980's  . CHOLECYSTECTOMY  1993  . EYE SURGERY  2009  . FOOT SURGERY    . JOINT REPLACEMENT  2012   Hip, Knee  . REVERSE SHOULDER ARTHROPLASTY  12/07/2011   Procedure: REVERSE SHOULDER ARTHROPLASTY;  Surgeon: Marin Shutter, MD;  Location: [facility];  Service: Orthopedics;  Laterality: Left;  left total reverse shoulder  . TONSILLECTOMY  1951    FAMILY HISTORY: Family History  Problem Relation Age of Onset  . Stroke  Mother 25  . Hypertension Mother   . CAD Father 59  . Heart attack Father   . Anesthesia problems Neg Hx     SOCIAL HISTORY: Social History   Socioeconomic History  . Marital status: Married    Spouse name: Denyse Amass  . Number of children: 4  . Years of education: 9th  . Highest education level: Not on file  Occupational History  . Occupation: Chartered certified accountant    Comment: Retired  Tobacco Use  . Smoking status: Never Smoker  . Smokeless tobacco: Never Used  Substance and Sexual Activity  . Alcohol use: No    Alcohol/week: 0.0 standard drinks  . Drug use: No  . Sexual activity: Not on file  Other Topics Concern  . Not on file  Social History Narrative   Son recently died of cancer.  Lives at home with husband Thomes Cake) .  She has 3 children .    Education 10 th grade.   Right handed.   Caffeine use: 1 cup coffee/ day         Social Determinants of Health   Financial Resource Strain: Not on file  Food Insecurity: Not on file  Transportation Needs: Not on file  Physical Activity: Not on file  Stress: Not on file  Social Connections: Not on file  Intimate Partner Violence: Not on file   PHYSICAL EXAM  Vitals:   07/28/20 1348  BP: (!) 162/88  Pulse: 76  Weight: 183 lb (83 kg)  Height: $Remove'5\' 3"'tVqTPKA$  (1.6 m)   Body mass index is 32.42 kg/m.  Generalized: Well developed, in no  acute distress  MMSE - Mini Mental State Exam 07/28/2020 11/13/2019 03/25/2019  Not completed: - - -  Orientation to time $Remov'4 5 4  'ycfhAv$ Orientation to Place $Remove'5 5 5  'toGyAaM$ Registration $Remov'3 3 3  'VIJlMj$ Attention/ Calculation 0 3 5  Recall $Remov'3 3 3  'goCTnl$ Language- name 2 objects $RemoveB'2 2 2  'zACIianr$ Language- repeat $RemoveBeforeDE'1 1 1  'jJVuyueZvwbAhuJ$ Language- follow 3 step command $RemoveBefo'3 3 2  'gyuaKfaedxe$ Language- read & follow direction 0 1 1  Write a sentence $RemoveBe'1 1 1  'kPJWlfhKc$ Copy design 0 0 0  Copy design-comments 5 animals - named 5 animals  Total score $RemoveBef'22 27 27    'GQunUSoHUx$ Neurological examination  Mentation: Alert oriented to time, place, history is mostly provided by her daughter. Follows all commands speech and language fluent Cranial nerve II-XII: Pupils were equal round reactive to light. Extraocular movements were full, visual field were full on confrontational test. Facial sensation and strength were normal. Head turning and shoulder shrug  were normal and symmetric. Motor: Normal strength, mild rigidity, bradykinesia noted to the left upper extremity mostly, less in left lower Sensory: Sensory testing is intact to soft touch on all 4 extremities. No evidence of extinction is noted.  Coordination: Cerebellar testing reveals good finger-nose-finger and heel-to-shin bilaterally.  Gait and station: Has to push off from seated position, able to ambulate in the room with walker, knee valgus, steadier  Reflexes: Deep tendon reflexes are symmetric but decreased throughout  DIAGNOSTIC DATA (LABS, IMAGING, TESTING) - I reviewed patient records, labs, notes, testing and imaging myself where available.  Lab Results  Component Value Date   WBC 6.2 03/18/2020   HGB 12.0 03/18/2020   HCT 36.8 03/18/2020   MCV 95.3 03/18/2020   PLT 199 03/18/2020      Component Value Date/Time   NA 137 03/18/2020 0725   NA 142 02/09/2015 1021   K 4.1  03/18/2020 0725   CL 101 03/18/2020 0725   CO2 23 03/18/2020 0725   GLUCOSE 276 (H) 03/18/2020 0725   BUN 12 03/18/2020 0725   BUN 17 02/09/2015 1021    CREATININE 1.28 (H) 03/18/2020 0725   CALCIUM 9.1 03/18/2020 0725   PROT 5.8 (L) 03/17/2020 0637   PROT 6.4 02/09/2015 1021   ALBUMIN 3.0 (L) 03/17/2020 0637   ALBUMIN 4.3 02/09/2015 1021   AST 32 03/17/2020 0637   ALT 12 03/17/2020 0637   ALKPHOS 69 03/17/2020 0637   BILITOT 1.8 (H) 03/17/2020 0637   BILITOT 0.9 02/09/2015 1021   GFRNONAA 39 (L) 03/18/2020 0725   GFRAA 45 (L) 03/18/2020 0725   Lab Results  Component Value Date   CHOL 117 10/14/2012   HDL 42 10/14/2012   LDLCALC 40 10/14/2012   TRIG 175 (H) 10/14/2012   CHOLHDL 2.8 10/14/2012   Lab Results  Component Value Date   HGBA1C 5.5 03/17/2020   No results found for: VITAMINB12 Lab Results  Component Value Date   TSH 2.859 03/17/2020      ASSESSMENT AND PLAN 85 y.o. year old female  has a past medical history of Anxiety, Dementia ([place]), Depression, Diabetes mellitus (2011), Headache(784.0), History of echocardiogram, Hypertension, Seizure ([place]), Stroke ([place]) (2002), and UTI (urinary tract infection) (02/2015). here with:  1.  Mild cognitive impairment -Aricept and Namenda were discontinued to simplify her medications -No change when on the medication vs off -MMSE was 22/30 today  2.  Seizures, most recent seizure January 23, 2020 -Continue Keppra 500 mg, 2 tablets twice a day  3.  Gait abnormality -Multifactorial, deconditioning, aging, bilateral knee pain, deformity -Good benefit with Sinemet, walking improved, less tremor, more confident with walking, steadier -Continue Sinemet 25/100 mg, 1 tablet 3 times a day -Follow-up in 6 months or sooner if needed  I spent 30 minutes of face-to-face and non-face-to-face time with patient.  This included previsit chart review, lab review, study review, order entry, electronic health record documentation, patient education.  Butler Denmark, AGNP-C, DNP 07/28/2020, 1:52 PM [facility] [address] [place], [place] [address] [phone]

## 2020-07-28 ENCOUNTER — Other Ambulatory Visit: Payer: Self-pay

## 2020-07-28 ENCOUNTER — Encounter: Payer: Self-pay | Admitting: Neurology

## 2020-07-28 ENCOUNTER — Ambulatory Visit: Payer: Medicare Other | Admitting: Neurology

## 2020-07-28 VITALS — BP 162/88 | HR 76 | Ht 63.0 in | Wt 183.0 lb

## 2020-07-28 DIAGNOSIS — G40909 Epilepsy, unspecified, not intractable, without status epilepticus: Secondary | ICD-10-CM

## 2020-07-28 DIAGNOSIS — F0391 Unspecified dementia with behavioral disturbance: Secondary | ICD-10-CM | POA: Diagnosis not present

## 2020-07-28 DIAGNOSIS — R269 Unspecified abnormalities of gait and mobility: Secondary | ICD-10-CM

## 2020-07-28 MED ORDER — LEVETIRACETAM 500 MG PO TABS: ORAL_TABLET | ORAL | 4 refills | Status: DC

## 2020-07-28 MED ORDER — CARBIDOPA-LEVODOPA 25-100 MG PO TABS: 1.0000 | ORAL_TABLET | Freq: Three times a day (TID) | ORAL | 11 refills | Status: DC

## 2020-07-28 NOTE — Patient Instructions (Signed)
Continue current medications Continue to be safe not to fall  See you back in 6 months

## 2020-08-19 NOTE — Progress Notes (Signed)
I have reviewed and agreed above plan. 

## 2020-09-27 DIAGNOSIS — Z23 Encounter for immunization: Secondary | ICD-10-CM | POA: Diagnosis not present

## 2020-09-27 DIAGNOSIS — E1142 Type 2 diabetes mellitus with diabetic polyneuropathy: Secondary | ICD-10-CM | POA: Diagnosis not present

## 2020-09-27 DIAGNOSIS — I10 Essential (primary) hypertension: Secondary | ICD-10-CM | POA: Diagnosis not present

## 2020-09-27 DIAGNOSIS — M150 Primary generalized (osteo)arthritis: Secondary | ICD-10-CM | POA: Diagnosis not present

## 2020-09-27 DIAGNOSIS — E7800 Pure hypercholesterolemia, unspecified: Secondary | ICD-10-CM | POA: Diagnosis not present

## 2020-09-27 DIAGNOSIS — M8580 Other specified disorders of bone density and structure, unspecified site: Secondary | ICD-10-CM | POA: Diagnosis not present

## 2020-09-27 DIAGNOSIS — Z0000 Encounter for general adult medical examination without abnormal findings: Secondary | ICD-10-CM | POA: Diagnosis not present

## 2020-09-28 DIAGNOSIS — E1142 Type 2 diabetes mellitus with diabetic polyneuropathy: Secondary | ICD-10-CM | POA: Diagnosis not present

## 2020-09-28 DIAGNOSIS — Z0000 Encounter for general adult medical examination without abnormal findings: Secondary | ICD-10-CM | POA: Diagnosis not present

## 2020-12-14 ENCOUNTER — Other Ambulatory Visit: Payer: Self-pay | Admitting: Neurology

## 2020-12-15 ENCOUNTER — Other Ambulatory Visit: Payer: Self-pay | Admitting: Neurology

## 2020-12-20 ENCOUNTER — Other Ambulatory Visit: Payer: Self-pay | Admitting: Neurology

## 2021-01-25 ENCOUNTER — Other Ambulatory Visit: Payer: Self-pay

## 2021-01-25 ENCOUNTER — Encounter: Payer: Self-pay | Admitting: Neurology

## 2021-01-25 ENCOUNTER — Ambulatory Visit: Payer: Medicare Other | Admitting: Neurology

## 2021-01-25 VITALS — BP 168/73 | HR 82 | Ht 63.0 in | Wt 188.0 lb

## 2021-01-25 DIAGNOSIS — G40909 Epilepsy, unspecified, not intractable, without status epilepticus: Secondary | ICD-10-CM | POA: Diagnosis not present

## 2021-01-25 DIAGNOSIS — R269 Unspecified abnormalities of gait and mobility: Secondary | ICD-10-CM | POA: Diagnosis not present

## 2021-01-25 DIAGNOSIS — F0391 Unspecified dementia with behavioral disturbance: Secondary | ICD-10-CM | POA: Diagnosis not present

## 2021-01-25 MED ORDER — LEVETIRACETAM 500 MG PO TABS: ORAL_TABLET | ORAL | 4 refills | Status: DC

## 2021-01-25 MED ORDER — CARBIDOPA-LEVODOPA 25-100 MG PO TABS: 1.0000 | ORAL_TABLET | Freq: Three times a day (TID) | ORAL | 11 refills | Status: DC

## 2021-01-25 NOTE — Patient Instructions (Signed)
Continue current medications See you back in 8 months

## 2021-01-25 NOTE — Progress Notes (Signed)
PATIENT: Martha Olsen DOB: [birth year]  REASON FOR VISIT: follow up HISTORY FROM: patient Primary Neurologist: Dr. [name]   HISTORY  [name], seen in refer by her primary care physician Dr.  Donavan Burnet for evaluation of memory loss.     She has past medical history of HTN, s/p CVA right basal ganglia 11/2001, dyslipidemia, DM, anxiety/depression, s/p appendectomy, s/p hysterectomy, DJD, s/p left hip replacement 03/2002, left knee replacement 01/2011, foot surgery and reverse left shoulder arthroplasty 11/2011.  She had 9 years of education, she was a Chartered certified accountant, she worked at [employer], [employer], retried at age [place] care of her grandchildren.  She denies family history of memory loss. Mother died at age 55 from stroke.   I saw her initially following her hospital discharge in March 2013,   In October 13 2011, she was at home, sitting at the rocking chair, was noted by her family fell over the chair, had generalized tonic-clonic seizure, patient has no warning signs, no collection of the event,  EMS was called, she was taken by ambulance to the hospital, she could not remember waking up in the hospital confused, she was put on Keppra 500 mg twice a day, tolerating it well, there was no recurrent seizure.   EEG was normal. MRI of the brain in 2013 has demonstrated old basal ganglion, and coronal radiata stroke, no acute lesions,   She complains of headache everyday, right temporal regions, light noise is bothersome,    She has quit driving since [age], husband retired, she is active at home, house work   She has no recurrent seizures, tolerating Keppra 500 mg twice a day without significant side effect, she has increased gait difficulty because of joints, knee pain, She has been complaining of a year history of worsening headaches, bilateral frontal, vortex region, she has to take ibuprofen 200 mg 2-3 tablets each time, sometimes two dosage each day   She was given  prescription of tramadol 50 mg as needed in December 2015, her headache has much improved, taking tramadol about twice each week, she continue has bilateral knee pain, low back pain, gait difficulty, she also has nocturia, frequent awakening, loss snoring, excessive daytime sleepiness, fatigue, sleep studies pending in August 25 2014   ESR, TSH was normal December 2015, mild elevated C-reactive protein 11.   UPDATE February 18 2015: She is accompanied by her husband, and daughter at today's clinical visit,   She had a urgent visit with Dr. Jaynee Eagles in February 09 2015 because of increased confusion, was found to have UTI, she was given prescription of Augmentin for 5 days, there was no significant change in her confusion   February 09 2015, EEG recording asymmetric slowing of the left hemisphere, with occasionally sharp transient   Patient's continue have slow worsening confusion, agitation, paranoid ideation, difficulty sleeping at nighttime, She had 10 years of education, worked two jobs most of her life, she retired at age 8, was very active until she had stroke in 2013, was noted to have declining functional status, increased memory trouble, especially since January 2016,   She also has worsening gait difficulty,worsening right knee pain, had a recent evaluation by orthopedic surgeon,she is not a candidate for right knee replacement anymore,because of worsening memory trouble, gradually increased confusion   She also complains of low back pain,worsening bowel and bladder incontinence Update May 11 2015: Her confusion continues with getting worse, she is more agitated, could not well at night  time, paranoid, scared, seroquel has been helpful, she is now taking 25 mg 1 in the morning, 1 at 2:00, 2 tablets at night   She is on polypharmacy treatment including Paxil, BuSpar, Ativan as needed, She has no recurrent seizure, taking Keppra 500 mg/1000 mg   UPDATE Aug 11 2015: She is now taking seroquel  32m 3 tabs qhs,ativan prn, but she still has trouble sleeping.   She is seeing psychiatrist, taking BuSpar, she has been dealing with depression for a long time. She was previously treated as SSRI, which has been helpful   She has no recurrent seizure, taking Keppra 500/1000 mg daily     UPDATE Jan 10th 2018: She is with her husband at today's clinical visit, she has been doing very well over the past 6 months, tolerating current medications, does not want to make any changes, no recurrent seizure, on Keppra 500/1000   UPDATE November 14 2019: She is with her daughter at today's clinical visit, since her husband passed away 3 years ago, she has moved in with her daughter, overall she is stable, was noted to have increased gait abnormality, rely on her walker for short distance at home, recently received physical therapy, she is happy about her current medications, no recurrent seizure, is calm most of the time, sleeps well, has good appetite,   UPDATE January 26 2020: This is an early than expected visit, she and her family went on vacation on January 22, 2019, next morning she had a recurrent seizure, weakness by her family, after a bite of hamburger, she was noted to have body shaking, foaming, out of her mouth, lasting for few minutes,   [facility] reviewed hospital discharge on January 24, 2020, CT of the head without contrast no acute abnormality, MRI of the brain without contrast, no evidence of acute infarction, supratentorium small vessel disease, chronic lacunar infarction at the right basal ganglion, mild generalized atrophy Ultrasound of carotid artery, incidental finding of left thyroid nodule measuring 1.7 x 1.1 x 1.8 cm, left internal carotid artery 50 to 69% stenosis, no significant right internal carotid artery stenosis, bilateral vertebral artery with antegrade flow   Echocardiogram technically difficult study, no significant abnormality,  ejection fraction 60 to 65%   EKG, sinus rhythm with premature atrial complex,   Keppra was increased from 500/1000milligram to 1000 mg twice a day, she tolerated the medication well, but does noted to have excessive fatigue, sleepiness, increased gait abnormality, she was started on sentiment at last visit in April 2021, she tolerated the medication well, mildly mild improvement in her gait, both patient and her family [facility] her older son lives in the [facility] facility, paperwork was completed [place] [address] [place] [id number]: [phone]  Update July 28, 2020 SS: When last seen Dr. YKrista Blue went to 30 days rehab, then home PT 6-8 weeks.  Much improvement.  Also improved with Sinemet.  Walking is more stable, more confident.  Less tremor, was noted in hands.  In wheelchair prior, now using walker.  Lives with her daughter.  Requires assistance with ADLs.  No recurrent seizures.  Seeing psychiatrist in the next several weeks.  [place]  [place] with her [place], overall quite improved, wanted to show Dr. YKrista Blueher progress, Dr. YKrista Bluewas able to briefly see her as well.  Remains off  Aricept and Namenda.  She was hospitalized in August 2021 for generalized weakness, found to have UTI, was discharged home.  Update January 25, 2021 SS: MMSE 26/30 today.Getting ready to go to [place] in a week. Living with daughter, still doing well, walking is same, much better since rehab, using walker, no falls. No seizures. Memory is stable, no worse off Namenda or Aricept. Remains on Sinemet 25-100 3 times daily, less shaky with eating. Still trouble walking distances, going out uses wheelchair. Sinemet overall remains beneficial, move easier, get up.  REVIEW OF SYSTEMS: Out of a complete 14 system review of symptoms, the patient complains only of the following symptoms, and all other reviewed systems are  negative.  See HPI  ALLERGIES: Allergies  Allergen Reactions   Codeine Hives    HOME MEDICATIONS: Outpatient Medications Prior to Visit  Medication Sig Dispense Refill   acetaminophen (TYLENOL) 500 MG tablet Take 500 mg by mouth every 6 (six) hours as needed for mild pain or headache.     amLODipine (NORVASC) 2.5 MG tablet Take 2.5 mg by mouth daily.     ARIPiprazole (ABILIFY) 5 MG tablet Take 5 mg by mouth daily.      aspirin 81 MG chewable tablet Chew 1 tablet (81 mg total) by mouth daily. 30 tablet 2   atorvastatin (LIPITOR) 40 MG tablet Take 40 mg by mouth daily.     carbidopa-levodopa (SINEMET IR) 25-100 MG tablet Take 1 tablet by mouth 3 (three) times daily. 90 tablet 11   carvedilol (COREG) 6.25 MG tablet Take 1 tablet (6.25 mg total) by mouth 2 (two) times daily with a meal. 180 tablet 3   gabapentin (NEURONTIN) 300 MG capsule Take 600 mg by mouth daily.      glimepiride (AMARYL) 2 MG tablet Take 2 mg by mouth daily before breakfast.     levETIRAcetam (KEPPRA) 500 MG tablet TAKE 2 TABLETS BY MOUTH EVERY MORNING AND 2 TABLETS EVERY NIGHT 360 tablet 4   mirtazapine (REMERON) 15 MG tablet Take 15 mg by mouth at bedtime.     Multiple Vitamin (MULTIVITAMIN) capsule Take 1 capsule by mouth daily.     repaglinide (PRANDIN) 0.5 MG tablet Take 0.5 mg by mouth 3 (three) times daily.     sertraline (ZOLOFT) 50 MG tablet TAKE 1&1/2 TABLETS BY MOUTH DAILY (Patient taking differently: Take 50 mg by mouth daily.) 135 tablet 1   trimethoprim (TRIMPEX) 100 MG tablet Take 100 mg by mouth 2 (two) times daily.      VIIBRYD 40 MG TABS Take 40 mg by mouth daily.      No facility-administered medications prior to visit.    PAST MEDICAL HISTORY: Past Medical History:  Diagnosis Date   Anxiety    Dementia ([place])    Depression    Diabetes mellitus 2011   Headache(784.0)    History of echocardiogram    Echo 5/16: EF 55-60%, normal wall motion, grade 1 diastolic dysfunction   Hypertension     Seizure ([place])    Stroke ([place]) 2002   right side weakness   UTI (urinary tract infection) 02/2015    PAST SURGICAL HISTORY: Past Surgical History:  Procedure Laterality Date   ABDOMINAL HYSTERECTOMY  1980's   APPENDECTOMY  1980's   CHOLECYSTECTOMY  1993   EYE SURGERY  2009   FOOT SURGERY     JOINT REPLACEMENT  2012   Hip, Knee   REVERSE SHOULDER ARTHROPLASTY  12/07/2011   Procedure: REVERSE SHOULDER ARTHROPLASTY;  Surgeon:  Marin Shutter, MD;  Location: [facility];  Service: Orthopedics;  Laterality: Left;  left total reverse shoulder   TONSILLECTOMY  1951    FAMILY HISTORY: Family History  Problem Relation Age of Onset   Stroke Mother 66   Hypertension Mother    CAD Father 40   Heart attack Father    Anesthesia problems Neg Hx     SOCIAL HISTORY: Social History   Socioeconomic History   Marital status: Married    Spouse name: Denyse Amass   Number of children: 4   Years of education: 9th   Highest education level: Not on file  Occupational History   Occupation: Chartered certified accountant    Comment: Retired  Tobacco Use   Smoking status: Never   Smokeless tobacco: Never  Substance and Sexual Activity   Alcohol use: No    Alcohol/week: 0.0 standard drinks   Drug use: No   Sexual activity: Not on file  Other Topics Concern   Not on file  Social History Narrative   Son recently died of cancer.  Lives at home with husband Thomes Cake) .  She has 3 children .    Education 10 th grade.   Right handed.   Caffeine use: 1 cup coffee/ day         Social Determinants of Health   Financial Resource Strain: Not on file  Food Insecurity: Not on file  Transportation Needs: Not on file  Physical Activity: Not on file  Stress: Not on file  Social Connections: Not on file  Intimate Partner Violence: Not on file   PHYSICAL EXAM  Vitals:   01/25/21 1309  BP: (!) 168/73  Pulse: 82  Weight: 188 lb (85.3 kg)  Height: 5' 3" (1.6 m)    Body mass index is 33.3 kg/m.  Generalized: Well  developed, in no acute distress  MMSE - Mini Mental State Exam 01/25/2021 07/28/2020 11/13/2019  Not completed: - - -  Orientation to time _0 Orientation to Place _1 Registration _2 Attention/ Calculation 2 0 3  Recall _3 Language- name 2 objects _4 Language- repeat _5 Language- follow 3 step command _6 Language- read & follow direction 1 0 1  Write a sentence _7 Copy design 0 0 0  Copy design-comments 4 legged animals x 1 min: 9 5 animals -  Total score _8 Neurological examination  Mentation: Alert oriented to time, place, history is mostly provided by her daughter. Follows all commands speech and language fluent Cranial nerve II-XII: Pupils were equal round reactive to light. Extraocular movements were full, visual field were full on confrontational test. Facial sensation and strength were normal. Head turning and shoulder shrug  were normal and symmetric. Motor: Normal strength, mild rigidity upper right more than left, no significant bradykinesia noted with finger or toe taps Sensory: Sensory testing is intact to soft touch on all 4 extremities. No evidence of extinction is noted.  Coordination: Cerebellar testing reveals good finger-nose-finger bilaterally Gait and station: Has to push off from seated position to stand, able to ambulate in the room with walker, knee valgus, toe deformity bilaterally   DIAGNOSTIC DATA (LABS, IMAGING, TESTING) - I reviewed patient records, labs, notes, testing and imaging myself where available.  Lab Results  Component Value Date   WBC 6.2 03/18/2020   HGB 12.0 03/18/2020   HCT  36.8 03/18/2020   MCV 95.3 03/18/2020   PLT 199 03/18/2020      Component Value Date/Time   NA 137 03/18/2020 0725   NA 142 02/09/2015 1021   K 4.1 03/18/2020 0725   CL 101 03/18/2020 0725   CO2 23 03/18/2020 0725   GLUCOSE 276 (H) 03/18/2020 0725   BUN 12 03/18/2020 0725   BUN 17 02/09/2015 1021   CREATININE 1.28 (H)  03/18/2020 0725   CALCIUM 9.1 03/18/2020 0725   PROT 5.8 (L) 03/17/2020 0637   PROT 6.4 02/09/2015 1021   ALBUMIN 3.0 (L) 03/17/2020 0637   ALBUMIN 4.3 02/09/2015 1021   AST 32 03/17/2020 0637   ALT 12 03/17/2020 0637   ALKPHOS 69 03/17/2020 0637   BILITOT 1.8 (H) 03/17/2020 0637   BILITOT 0.9 02/09/2015 1021   GFRNONAA 39 (L) 03/18/2020 0725   GFRAA 45 (L) 03/18/2020 0725   Lab Results  Component Value Date   CHOL 117 10/14/2012   HDL 42 10/14/2012   LDLCALC 40 10/14/2012   TRIG 175 (H) 10/14/2012   CHOLHDL 2.8 10/14/2012   Lab Results  Component Value Date   HGBA1C 5.5 03/17/2020   No results found for: [id number] Lab Results  Component Value Date   TSH 2.859 03/17/2020      ASSESSMENT AND PLAN 85 y.o. year old female  has a past medical history of Anxiety, Dementia ([place]), Depression, Diabetes mellitus (2011), Headache(784.0), History of echocardiogram, Hypertension, Seizure ([place]), Stroke ([place]) (2002), and UTI (urinary tract infection) (02/2015). here with:  1.  Mild cognitive impairment -Stable, MMSE 26/30 today -No change since being off Aricept and Namenda   2.  Seizures, most recent seizure January 23, 2020 -Stable, Continue Keppra 500 mg, 2 tablets twice a day  3.  Gait abnormality -Doing much better since rehab and Sinemet (faster pace walking, less tremor, steadier), living with daughter  -Multifactorial, deconditioning, aging, bilateral knee pain, deformity -Continue Sinemet 25-100 mg 3 times daily  -Follow-up in 8 months with Dr.Yan or sooner if needed   I spent 30 minutes of face-to-face and non-face-to-face time with patient.  This included previsit chart review, lab review, study review, order entry, medications, management, and follow-up.   Butler Denmark, AGNP-C, DNP 01/25/2021, 1:25 PM [facility] [address] [place], [place] [address] [phone]

## 2021-02-09 DIAGNOSIS — R2243 Localized swelling, mass and lump, lower limb, bilateral: Secondary | ICD-10-CM | POA: Diagnosis not present

## 2021-02-09 DIAGNOSIS — I10 Essential (primary) hypertension: Secondary | ICD-10-CM | POA: Diagnosis not present

## 2021-02-09 DIAGNOSIS — M7989 Other specified soft tissue disorders: Secondary | ICD-10-CM | POA: Diagnosis not present

## 2021-02-09 DIAGNOSIS — R918 Other nonspecific abnormal finding of lung field: Secondary | ICD-10-CM | POA: Diagnosis not present

## 2021-02-09 DIAGNOSIS — N3000 Acute cystitis without hematuria: Secondary | ICD-10-CM | POA: Diagnosis not present

## 2021-02-09 DIAGNOSIS — R600 Localized edema: Secondary | ICD-10-CM | POA: Diagnosis not present

## 2021-02-09 DIAGNOSIS — E119 Type 2 diabetes mellitus without complications: Secondary | ICD-10-CM | POA: Diagnosis not present

## 2021-02-09 DIAGNOSIS — Z8673 Personal history of transient ischemic attack (TIA), and cerebral infarction without residual deficits: Secondary | ICD-10-CM | POA: Diagnosis not present

## 2021-02-21 DIAGNOSIS — H4322 Crystalline deposits in vitreous body, left eye: Secondary | ICD-10-CM | POA: Diagnosis not present

## 2021-02-21 DIAGNOSIS — Z961 Presence of intraocular lens: Secondary | ICD-10-CM | POA: Diagnosis not present

## 2021-02-21 DIAGNOSIS — H16142 Punctate keratitis, left eye: Secondary | ICD-10-CM | POA: Diagnosis not present

## 2021-03-30 NOTE — Progress Notes (Signed)
Chart reviewed, agree above plan ?

## 2021-04-05 DIAGNOSIS — K219 Gastro-esophageal reflux disease without esophagitis: Secondary | ICD-10-CM | POA: Diagnosis not present

## 2021-04-05 DIAGNOSIS — H6120 Impacted cerumen, unspecified ear: Secondary | ICD-10-CM | POA: Diagnosis not present

## 2021-04-05 DIAGNOSIS — M8580 Other specified disorders of bone density and structure, unspecified site: Secondary | ICD-10-CM | POA: Diagnosis not present

## 2021-04-05 DIAGNOSIS — I10 Essential (primary) hypertension: Secondary | ICD-10-CM | POA: Diagnosis not present

## 2021-04-05 DIAGNOSIS — Z23 Encounter for immunization: Secondary | ICD-10-CM | POA: Diagnosis not present

## 2021-04-05 DIAGNOSIS — E7800 Pure hypercholesterolemia, unspecified: Secondary | ICD-10-CM | POA: Diagnosis not present

## 2021-04-05 DIAGNOSIS — R1310 Dysphagia, unspecified: Secondary | ICD-10-CM | POA: Diagnosis not present

## 2021-04-05 DIAGNOSIS — M150 Primary generalized (osteo)arthritis: Secondary | ICD-10-CM | POA: Diagnosis not present

## 2021-04-05 DIAGNOSIS — E1142 Type 2 diabetes mellitus with diabetic polyneuropathy: Secondary | ICD-10-CM | POA: Diagnosis not present

## 2021-06-20 DIAGNOSIS — H01009 Unspecified blepharitis unspecified eye, unspecified eyelid: Secondary | ICD-10-CM | POA: Diagnosis not present

## 2021-06-20 DIAGNOSIS — H4322 Crystalline deposits in vitreous body, left eye: Secondary | ICD-10-CM | POA: Diagnosis not present

## 2021-06-20 DIAGNOSIS — H35373 Puckering of macula, bilateral: Secondary | ICD-10-CM | POA: Diagnosis not present

## 2021-08-31 DIAGNOSIS — R0901 Asphyxia: Secondary | ICD-10-CM | POA: Diagnosis not present

## 2021-08-31 DIAGNOSIS — R059 Cough, unspecified: Secondary | ICD-10-CM | POA: Diagnosis not present

## 2021-09-20 ENCOUNTER — Ambulatory Visit: Payer: Medicare Other | Admitting: Neurology

## 2021-10-04 DIAGNOSIS — E1142 Type 2 diabetes mellitus with diabetic polyneuropathy: Secondary | ICD-10-CM | POA: Diagnosis not present

## 2021-10-04 DIAGNOSIS — E7800 Pure hypercholesterolemia, unspecified: Secondary | ICD-10-CM | POA: Diagnosis not present

## 2021-10-04 DIAGNOSIS — M8580 Other specified disorders of bone density and structure, unspecified site: Secondary | ICD-10-CM | POA: Diagnosis not present

## 2021-10-04 DIAGNOSIS — I10 Essential (primary) hypertension: Secondary | ICD-10-CM | POA: Diagnosis not present

## 2021-10-04 DIAGNOSIS — M150 Primary generalized (osteo)arthritis: Secondary | ICD-10-CM | POA: Diagnosis not present

## 2021-10-13 ENCOUNTER — Ambulatory Visit: Payer: Medicare Other | Admitting: Neurology

## 2021-10-13 ENCOUNTER — Encounter: Payer: Self-pay | Admitting: Neurology

## 2021-10-13 VITALS — BP 154/80 | HR 76 | Ht 63.0 in | Wt 184.5 lb

## 2021-10-13 DIAGNOSIS — G40909 Epilepsy, unspecified, not intractable, without status epilepticus: Secondary | ICD-10-CM

## 2021-10-13 DIAGNOSIS — G20C Parkinsonism, unspecified: Secondary | ICD-10-CM | POA: Insufficient documentation

## 2021-10-13 DIAGNOSIS — F0393 Unspecified dementia, unspecified severity, with mood disturbance: Secondary | ICD-10-CM | POA: Diagnosis not present

## 2021-10-13 DIAGNOSIS — I639 Cerebral infarction, unspecified: Secondary | ICD-10-CM

## 2021-10-13 DIAGNOSIS — G20 Parkinson's disease: Secondary | ICD-10-CM

## 2021-10-13 MED ORDER — LEVETIRACETAM 500 MG PO TABS: ORAL_TABLET | ORAL | 4 refills | Status: DC

## 2021-10-13 MED ORDER — CARBIDOPA-LEVODOPA 25-100 MG PO TABS: 1.0000 | ORAL_TABLET | Freq: Three times a day (TID) | ORAL | 4 refills | Status: DC

## 2021-10-13 NOTE — Progress Notes (Signed)
? ?Chief Complaint  ?Patient presents with  ? Follow-up  ?  Rm 14. Accompanied by daughter. ?[place] 16/30. ?Denies any new seizure activity. ?Gait is stable using walker.  ? ? ? ? ?ASSESSMENT AND PLAN ? ?Martha Olsen is a 86 y.o. female   ?Right Basal ganglion Stroke ? Vascular risk factor of aging, hypertension, hyperlipidemia, diabetes, obesity, sedentary lifestyle, obstructive sleep apnea, could not tolerate CPAP ? Is on aspirin 81 mg daily ? ?Seizure ? In March 2013, again July 2020, ? Tolerating Keppra 500 mg 2 tablets twice a day ? ?Parkinsonism ? Not typical idiopathic Parkinson's disease, likely secondary parkinsonism that is related to her long-term psychiatric medication treatment, ? She is doing well on Sinemet 25/100 mg 3 times daily ?She lives with her daughter, reported Sinemet does help her tremor, walking, ? ?Dementia with agitation ? Likely central nervous system degenerative disorder, with vascular component, prolonged history of bipolar disorder also contribute ? Stable now ?  ?Long history of bipolar disorder, seen psychiatrist, ? Was treated with different medications in the past, stable on current polypharmacy treatment,  ? ? ?DIAGNOSTIC DATA (LABS, IMAGING, TESTING) ?- I reviewed patient records, labs, notes, testing and imaging myself where available. ?Personally reviewed MRI of the brain February 2020, no acute abnormality, mild small vessel disease, progression of atrophy, stable infarction of right basal ganglia ? ?MEDICAL HISTORY: ? ?Martha Olsen is a 86 year old female, followed up by our clinic for dementia, her primary care physician is Dr. Alroy Dust, L.[name], MD  ? ?I reviewed and summarized the referring note.  Past medical history ?Hypertension ?Stroke ?Diabetes ?Depression anxiety ?History of left hip, knee replacement, ?Left shoulder arthroplasty ? 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  Her mother died at age 104 from stroke ? ?She has a witnessed seizure on March 2013, was treated with Keppra 500 mg twice a day, had no recurrent seizure ? ?Personally reviewed MRI of the brain, demonstrated old basal ganglia, corona radiata stroke, no acute abnormality, EEG was normal ? ?She has slow worsening memory loss, quit driving since [age], over the years, she also complains of increased gait abnormality due to joints pain, taking tramadol as needed, ? ?There was increased confusion when she suffered a UTI, later paranoid, confusion agitation persistent even without active infection, began to treat with Seroquel titrating dose since 2016, also Ativan as needed, because of abnormal EEG in 2016, asymmetric slowing of left hemisphere, along with her increased confusion, Keppra dose was increased to 500/1000, she tolerated well ? ?Recurrent seizure January 22, 2019, while she was on vacation, she was treated at the local hospital, MRI of the brain showed no acute infarction, Keppra dose was further increased to five 1000 mg twice daily, ? ?she also developed some parkinsonian features, started on Sinemet 25/100 mg 3 times daily, which does help her some ? ?She lived in with her daughter since 2018, she likes to play game, shopping, good appetite, sleeps well, use bathroom well.  ? ?Her mood is fairly stable taking Abilify 5 mg daily, Remeron '15mg'$  qhs, zoloft '50mg'$  qam, viibryad '40mg'$ ,  she is going to [facility] pyschiatrist Dr. Graciella Freer, she dose have the diagnosis of  bipolar disorder, ? ?Keppra 500 mg 2 tablets twice a day with no recurrent seizure ? ? ?PHYSICAL EXAM: ?  ?Vitals:  ? 10/13/21 0856 10/13/21 0909  ?BP: (!) 190/79 (!) 154/80  ?  Pulse: 85 76  ?Weight: 184 lb 8 oz (83.7 kg)   ?Height: '5\' 3"'$  (1.6 m)   ? ?Not recorded ?  ? ? ?Body mass index is 32.68 kg/m?. ? ?PHYSICAL EXAMNIATION: ? ?Gen: NAD, conversant, well nourised, well groomed                     ?Cardiovascular: Regular rate rhythm,  no peripheral edema, warm, nontender. ?Eyes: Conjunctivae clear without exudates or hemorrhage ?Neck: Supple, no carotid bruits. ?Pulmonary: Clear to auscultation bilaterally  ? ?NEUROLOGICAL EXAM: ? ?MENTAL STATUS: ?Speech: ?   Speech is normal; fluent and spontaneous with normal comprehension.  ?Cognition: ? ?  10/13/2021  ?  8:59 AM 02/09/2015  ?  8:17 AM  ?Montreal Cognitive Assessment   ?Visuospatial/ Executive (0/5) 1 0  ?Naming (0/3) 2 3  ?Attention: Read list of digits (0/2) 0 0  ?Attention: Read list of letters (0/1) 1 1  ?Attention: Serial 7 subtraction starting at 100 (0/3) 1 0  ?Language: Repeat phrase (0/2) 2 1  ?Language : Fluency (0/1) 0 0  ?Abstraction (0/2) 1 1  ?Delayed Recall (0/5) 2 0  ?Orientation (0/6) 5 5  ?Total 15 11  ?Adjusted Score (based on education) 16 12  ?  ?  ?CRANIAL NERVES: ?CN II: Visual fields are full to confrontation. Pupils are round equal and briskly reactive to light. ?CN III, IV, VI: extraocular movement are normal. No ptosis. ?CN V: Facial sensation is intact to light touch ?CN VII: Face is symmetric with normal eye closure  ?CN VIII: Hearing is normal to causal conversation. ?CN IX, X: Phonation is normal. ?CN XI: Head turning and shoulder shrug are intact ? ?MOTOR: No resting tremor, mild right more than left rigidity, bradykinesia, ? ?REFLEXES: ?Reflexes are hypoactive and symmetric ? ?SENSORY: ?Intact to light touch . ? ?COORDINATION: ?There is no trunk or limb dysmetria noted. ? ?GAIT/STANCE: Rely on a walker to get up from seated position, dragging right leg some, unsteady ? ?REVIEW OF SYSTEMS:  ?Full 14 system review of systems performed and notable only for as above ?All other review of systems were negative. ? ? ?ALLERGIES: ?Allergies  ?Allergen Reactions  ? Codeine Hives  ? ? ?HOME MEDICATIONS: ?Current Outpatient Medications  ?Medication Sig Dispense Refill  ? acetaminophen (TYLENOL) 500 MG tablet Take 500 mg by mouth every 6 (six) hours as needed for mild pain or  headache.    ? amLODipine (NORVASC) 2.5 MG tablet Take 2.5 mg by mouth daily.    ? ARIPiprazole (ABILIFY) 5 MG tablet Take 5 mg by mouth daily.     ? aspirin 81 MG chewable tablet Chew 1 tablet (81 mg total) by mouth daily. 30 tablet 2  ? atorvastatin (LIPITOR) 40 MG tablet Take 40 mg by mouth daily.    ? carbidopa-levodopa (SINEMET IR) 25-100 MG tablet Take 1 tablet by mouth 3 (three) times daily. 90 tablet 11  ? carvedilol (COREG) 6.25 MG tablet Take 1 tablet (6.25 mg total) by mouth 2 (two) times daily with a meal. 180 tablet 3  ? gabapentin (NEURONTIN) 300 MG capsule Take 600 mg by mouth daily.     ? glimepiride (AMARYL) 2 MG tablet Take 2 mg by mouth daily before breakfast.    ? levETIRAcetam (KEPPRA) 500 MG tablet TAKE 2 TABLETS BY MOUTH EVERY MORNING AND 2 TABLETS EVERY NIGHT 360 tablet 4  ? mirtazapine (REMERON) 15 MG tablet Take 15 mg by mouth at bedtime.    ?  Multiple Vitamin (MULTIVITAMIN) capsule Take 1 capsule by mouth daily.    ? repaglinide (PRANDIN) 0.5 MG tablet Take 0.5 mg by mouth 3 (three) times daily.    ? sertraline (ZOLOFT) 50 MG tablet Take 50 mg by mouth daily.    ? trimethoprim (TRIMPEX) 100 MG tablet Take 100 mg by mouth 2 (two) times daily.     ? VIIBRYD 40 MG TABS Take 40 mg by mouth daily.     ? ?No current facility-administered medications for this visit.  ? ? ?PAST MEDICAL HISTORY: ?Past Medical History:  ?Diagnosis Date  ? Anxiety   ? Dementia ([place])   ? Depression   ? Diabetes mellitus 2011  ? Headache(784.0)   ? History of echocardiogram   ? Echo 5/16: EF 55-60%, normal wall motion, grade 1 diastolic dysfunction  ? Hypertension   ? Seizure ([place])   ? Stroke [facility]) 2002  ? right side weakness  ? UTI (urinary tract infection) 02/2015  ? ? ?PAST SURGICAL HISTORY: ?Past Surgical History:  ?Procedure Laterality Date  ? ABDOMINAL HYSTERECTOMY  1980's  ? APPENDECTOMY  1980's  ? CHOLECYSTECTOMY  1993  ? EYE SURGERY  2009  ? FOOT SURGERY    ? JOINT REPLACEMENT  2012  ? Hip, Knee  ? REVERSE  SHOULDER ARTHROPLASTY  12/07/2011  ? Procedure: REVERSE SHOULDER ARTHROPLASTY;  Surgeon: Marin Shutter, MD;  Location: [facility];  Service: Orthopedics;  Laterality: Left;  left total reverse shoulder  ? TONSILLECTO

## 2021-11-23 IMAGING — CR DG CHEST 2V
2 series · 2 of 2 positions shown · non-contrast
Comparison: 08/29/2018

CLINICAL DATA: Shortness of breath.

EXAM:
CHEST - 2 VIEW

[chest lat]
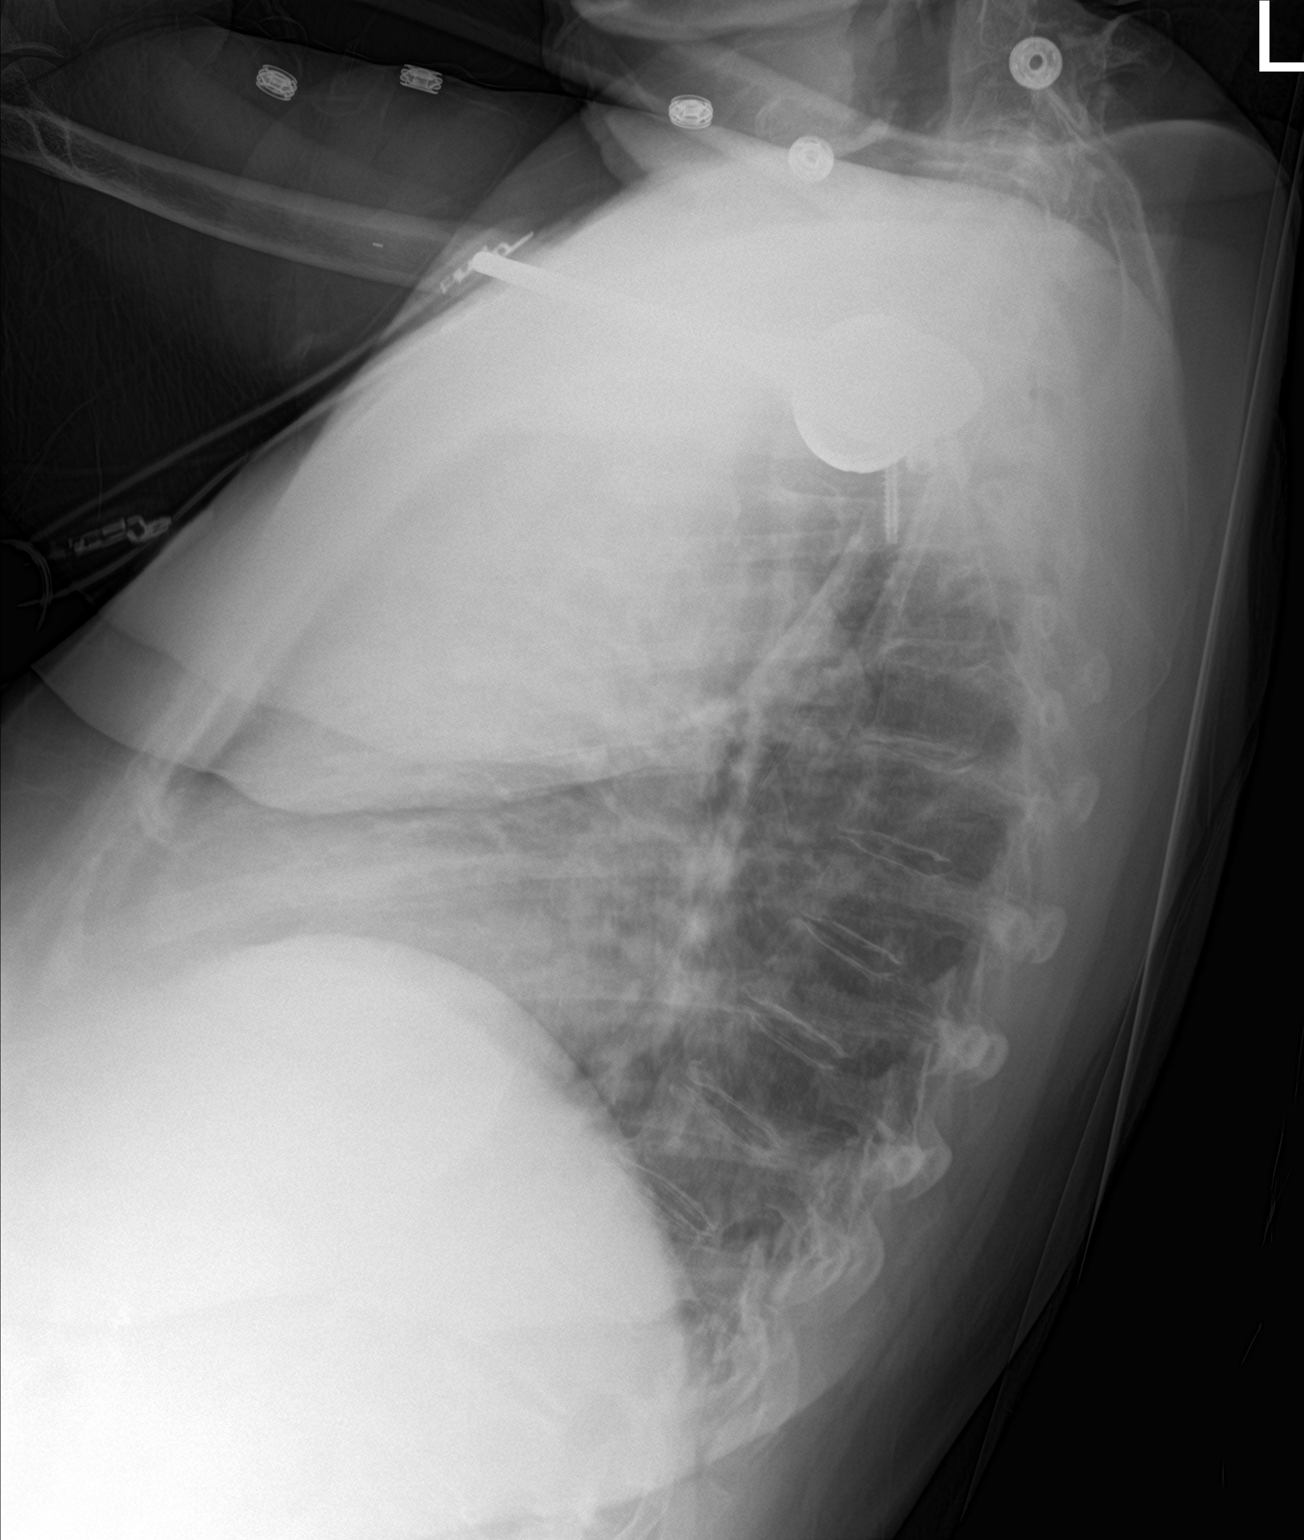

[chest ap]
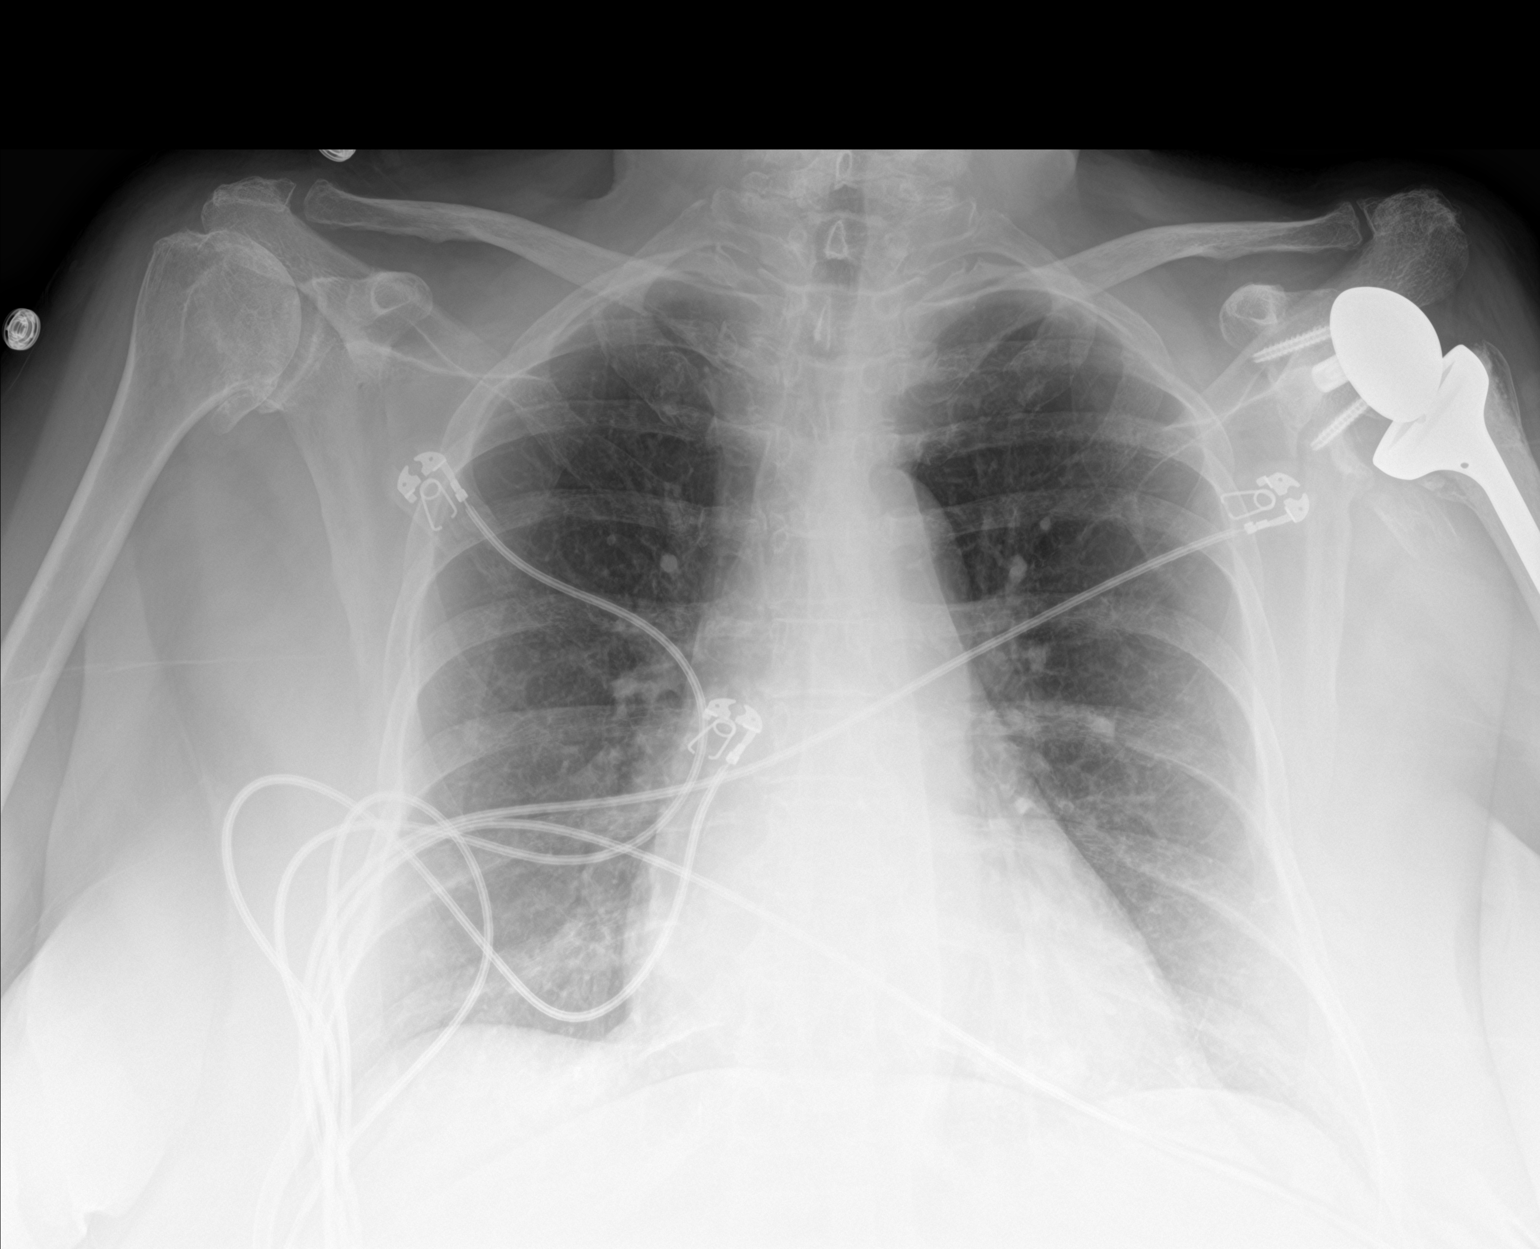

[2 of 2 positions shown; findings below may reference images not displayed]

FINDINGS: 9737 hours. The lungs are clear without focal pneumonia, edema,
pneumothorax or pleural effusion. Interstitial markings are
diffusely coarsened with chronic features. Cardiopericardial
silhouette is at upper limits of normal for size. Bones are
diffusely demineralized. Degenerative changes noted right shoulder.
Patient is status post left reverse shoulder replacement. Telemetry
leads overlie the chest.
IMPRESSION: Chronic interstitial changes without acute cardiopulmonary findings.

## 2022-01-17 DIAGNOSIS — S0083XA Contusion of other part of head, initial encounter: Secondary | ICD-10-CM | POA: Diagnosis not present

## 2022-01-17 DIAGNOSIS — S0993XA Unspecified injury of face, initial encounter: Secondary | ICD-10-CM | POA: Diagnosis not present

## 2022-01-17 DIAGNOSIS — R0989 Other specified symptoms and signs involving the circulatory and respiratory systems: Secondary | ICD-10-CM | POA: Diagnosis not present

## 2022-01-17 DIAGNOSIS — R220 Localized swelling, mass and lump, head: Secondary | ICD-10-CM | POA: Diagnosis not present

## 2022-01-17 DIAGNOSIS — W228XXA Striking against or struck by other objects, initial encounter: Secondary | ICD-10-CM | POA: Diagnosis not present

## 2022-01-17 DIAGNOSIS — S0990XA Unspecified injury of head, initial encounter: Secondary | ICD-10-CM | POA: Diagnosis not present

## 2022-01-17 DIAGNOSIS — Y998 Other external cause status: Secondary | ICD-10-CM | POA: Diagnosis not present

## 2022-02-16 DIAGNOSIS — R1314 Dysphagia, pharyngoesophageal phase: Secondary | ICD-10-CM | POA: Diagnosis not present

## 2022-02-16 DIAGNOSIS — K529 Noninfective gastroenteritis and colitis, unspecified: Secondary | ICD-10-CM | POA: Diagnosis not present

## 2022-02-16 DIAGNOSIS — R1312 Dysphagia, oropharyngeal phase: Secondary | ICD-10-CM | POA: Diagnosis not present

## 2022-02-24 ENCOUNTER — Other Ambulatory Visit: Payer: Self-pay | Admitting: Neurology

## 2022-03-03 DIAGNOSIS — R1312 Dysphagia, oropharyngeal phase: Secondary | ICD-10-CM | POA: Diagnosis not present

## 2022-03-30 DIAGNOSIS — E118 Type 2 diabetes mellitus with unspecified complications: Secondary | ICD-10-CM | POA: Diagnosis not present

## 2022-03-30 DIAGNOSIS — D123 Benign neoplasm of transverse colon: Secondary | ICD-10-CM | POA: Diagnosis not present

## 2022-03-30 DIAGNOSIS — Z7984 Long term (current) use of oral hypoglycemic drugs: Secondary | ICD-10-CM | POA: Diagnosis not present

## 2022-03-30 DIAGNOSIS — R0989 Other specified symptoms and signs involving the circulatory and respiratory systems: Secondary | ICD-10-CM | POA: Diagnosis not present

## 2022-03-30 DIAGNOSIS — K449 Diaphragmatic hernia without obstruction or gangrene: Secondary | ICD-10-CM | POA: Diagnosis not present

## 2022-03-30 DIAGNOSIS — K2900 Acute gastritis without bleeding: Secondary | ICD-10-CM | POA: Diagnosis not present

## 2022-03-30 DIAGNOSIS — D125 Benign neoplasm of sigmoid colon: Secondary | ICD-10-CM | POA: Diagnosis not present

## 2022-03-30 DIAGNOSIS — R569 Unspecified convulsions: Secondary | ICD-10-CM | POA: Diagnosis not present

## 2022-03-30 DIAGNOSIS — N8184 Pelvic muscle wasting: Secondary | ICD-10-CM | POA: Diagnosis not present

## 2022-03-30 DIAGNOSIS — K6289 Other specified diseases of anus and rectum: Secondary | ICD-10-CM | POA: Diagnosis not present

## 2022-03-30 DIAGNOSIS — K621 Rectal polyp: Secondary | ICD-10-CM | POA: Diagnosis not present

## 2022-03-30 DIAGNOSIS — K529 Noninfective gastroenteritis and colitis, unspecified: Secondary | ICD-10-CM | POA: Diagnosis not present

## 2022-03-30 DIAGNOSIS — R1312 Dysphagia, oropharyngeal phase: Secondary | ICD-10-CM | POA: Diagnosis not present

## 2022-03-30 DIAGNOSIS — K635 Polyp of colon: Secondary | ICD-10-CM | POA: Diagnosis not present

## 2022-03-30 DIAGNOSIS — K209 Esophagitis, unspecified without bleeding: Secondary | ICD-10-CM | POA: Diagnosis not present

## 2022-03-30 DIAGNOSIS — Z8673 Personal history of transient ischemic attack (TIA), and cerebral infarction without residual deficits: Secondary | ICD-10-CM | POA: Diagnosis not present

## 2022-03-30 DIAGNOSIS — G4730 Sleep apnea, unspecified: Secondary | ICD-10-CM | POA: Diagnosis not present

## 2022-03-30 DIAGNOSIS — D122 Benign neoplasm of ascending colon: Secondary | ICD-10-CM | POA: Diagnosis not present

## 2022-03-30 DIAGNOSIS — D124 Benign neoplasm of descending colon: Secondary | ICD-10-CM | POA: Diagnosis not present

## 2022-03-30 DIAGNOSIS — D128 Benign neoplasm of rectum: Secondary | ICD-10-CM | POA: Diagnosis not present

## 2022-03-30 DIAGNOSIS — K2980 Duodenitis without bleeding: Secondary | ICD-10-CM | POA: Diagnosis not present

## 2022-04-04 DIAGNOSIS — Z860101 Personal history of adenomatous and serrated colon polyps: Secondary | ICD-10-CM | POA: Insufficient documentation

## 2022-04-17 DIAGNOSIS — I10 Essential (primary) hypertension: Secondary | ICD-10-CM | POA: Diagnosis not present

## 2022-04-17 DIAGNOSIS — M8580 Other specified disorders of bone density and structure, unspecified site: Secondary | ICD-10-CM | POA: Diagnosis not present

## 2022-04-17 DIAGNOSIS — E7800 Pure hypercholesterolemia, unspecified: Secondary | ICD-10-CM | POA: Diagnosis not present

## 2022-04-17 DIAGNOSIS — Z23 Encounter for immunization: Secondary | ICD-10-CM | POA: Diagnosis not present

## 2022-04-17 DIAGNOSIS — Z0000 Encounter for general adult medical examination without abnormal findings: Secondary | ICD-10-CM | POA: Diagnosis not present

## 2022-04-17 DIAGNOSIS — M150 Primary generalized (osteo)arthritis: Secondary | ICD-10-CM | POA: Diagnosis not present

## 2022-04-17 DIAGNOSIS — E1142 Type 2 diabetes mellitus with diabetic polyneuropathy: Secondary | ICD-10-CM | POA: Diagnosis not present

## 2022-04-20 ENCOUNTER — Other Ambulatory Visit: Payer: Self-pay | Admitting: Neurology

## 2022-05-30 ENCOUNTER — Other Ambulatory Visit: Payer: Self-pay

## 2022-05-30 MED ORDER — CARBIDOPA-LEVODOPA 25-100 MG PO TABS: 1.0000 | ORAL_TABLET | Freq: Three times a day (TID) | ORAL | 3 refills | Status: DC

## 2022-06-21 ENCOUNTER — Telehealth: Payer: Self-pay | Admitting: Neurology

## 2022-06-21 NOTE — Telephone Encounter (Signed)
LVM and sent mychart msg informing pt of appointment change from 10/17/22 to 10/09/22 - MD out

## 2022-08-26 ENCOUNTER — Other Ambulatory Visit: Payer: Self-pay | Admitting: Neurology

## 2022-08-28 ENCOUNTER — Other Ambulatory Visit: Payer: Self-pay | Admitting: Family

## 2022-08-28 DIAGNOSIS — Z1231 Encounter for screening mammogram for malignant neoplasm of breast: Secondary | ICD-10-CM

## 2022-10-09 ENCOUNTER — Ambulatory Visit: Payer: Medicare Other | Admitting: Neurology

## 2022-10-17 ENCOUNTER — Ambulatory Visit: Payer: Medicare Other | Admitting: Neurology

## 2022-10-17 DIAGNOSIS — E1122 Type 2 diabetes mellitus with diabetic chronic kidney disease: Secondary | ICD-10-CM | POA: Diagnosis not present

## 2022-10-17 DIAGNOSIS — I10 Essential (primary) hypertension: Secondary | ICD-10-CM | POA: Diagnosis not present

## 2022-10-17 DIAGNOSIS — E7800 Pure hypercholesterolemia, unspecified: Secondary | ICD-10-CM | POA: Diagnosis not present

## 2022-10-17 DIAGNOSIS — L609 Nail disorder, unspecified: Secondary | ICD-10-CM | POA: Diagnosis not present

## 2022-10-17 DIAGNOSIS — M150 Primary generalized (osteo)arthritis: Secondary | ICD-10-CM | POA: Diagnosis not present

## 2022-10-17 DIAGNOSIS — G20C Parkinsonism, unspecified: Secondary | ICD-10-CM | POA: Diagnosis not present

## 2022-10-17 DIAGNOSIS — E1142 Type 2 diabetes mellitus with diabetic polyneuropathy: Secondary | ICD-10-CM | POA: Diagnosis not present

## 2022-10-17 DIAGNOSIS — N1831 Chronic kidney disease, stage 3a: Secondary | ICD-10-CM | POA: Diagnosis not present

## 2022-10-19 ENCOUNTER — Ambulatory Visit: Payer: Medicare Other | Admitting: Neurology

## 2022-10-19 ENCOUNTER — Encounter: Payer: Self-pay | Admitting: Neurology

## 2022-10-19 VITALS — BP 146/87 | HR 80 | Ht 63.0 in | Wt 193.5 lb

## 2022-10-19 DIAGNOSIS — G40909 Epilepsy, unspecified, not intractable, without status epilepticus: Secondary | ICD-10-CM

## 2022-10-19 DIAGNOSIS — G20C Parkinsonism, unspecified: Secondary | ICD-10-CM

## 2022-10-19 DIAGNOSIS — F0393 Unspecified dementia, unspecified severity, with mood disturbance: Secondary | ICD-10-CM

## 2022-10-19 DIAGNOSIS — I639 Cerebral infarction, unspecified: Secondary | ICD-10-CM

## 2022-10-19 MED ORDER — LEVETIRACETAM 500 MG PO TABS: ORAL_TABLET | ORAL | 3 refills | Status: DC

## 2022-10-19 MED ORDER — CARBIDOPA-LEVODOPA 25-100 MG PO TABS: 1.0000 | ORAL_TABLET | Freq: Three times a day (TID) | ORAL | 3 refills | Status: DC

## 2022-10-19 NOTE — Progress Notes (Signed)
Chief Complaint  Patient presents with   Follow-up    Rm 15. Patient with daughter. Patient reports no seizures and no complications with the medications      ASSESSMENT AND PLAN  Martha Olsen is a 87 y.o. female   Right Basal ganglion Stroke  Vascular risk factor of aging, hypertension, hyperlipidemia, diabetes, obesity, sedentary lifestyle, obstructive sleep apnea, could not tolerate CPAP  Is on aspirin 81 mg daily  Seizure  In March 2013, again July 2020,  Tolerating Keppra 500 mg 2 tablets twice a day  Possible parkinsonism  Not typical idiopathic Parkinson's disease, likely secondary parkinsonism that is related to her long-term psychiatric medication treatment,  Daughter previously reported Sinemet does help her tremor,  barely noticeable today, we will taper off,  Dementia with agitation  Likely central nervous system degenerative disorder, with vascular component, prolonged history of bipolar disorder also contribute  Stable now with polypharmacy    DIAGNOSTIC DATA (LABS, IMAGING, TESTING) - I reviewed patient records, labs, notes, testing and imaging myself where available. Personally reviewed MRI of the brain February 2020, no acute abnormality, mild small vessel disease, progression of atrophy, stable infarction of right basal ganglia  MEDICAL HISTORY:  Martha Olsen is a 87 year old female, followed up by our clinic for dementia, her primary care physician is Dr. Alroy Dust, L.[name], MD   I reviewed and summarized the referring note.  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  Her mother died at age 94 from stroke  She has a witnessed seizure on March 2013, was treated with Keppra 500 mg twice a day, had no recurrent  seizure  Personally reviewed MRI of the brain, demonstrated old basal ganglia, corona radiata stroke, no acute abnormality, EEG was normal  She has slow worsening memory loss, quit driving since [id number], over the years, she also complains of increased gait abnormality due to joints pain, taking tramadol as needed,  There was increased confusion when she suffered a UTI, later paranoid, confusion agitation persistent even without active infection, began to treat with Seroquel titrating dose since 2016, also Ativan as needed, because of abnormal EEG in 2016, asymmetric slowing of left hemisphere, along with her increased confusion, Keppra dose was increased to 500/1000, she tolerated well  Recurrent seizure January 22, 2019, while she was on vacation, she was treated at the local hospital, MRI of the brain showed no acute infarction, Keppra dose was further increased to five 1000 mg twice daily,  she also developed some parkinsonian features, started on Sinemet 25/100 mg 3 times daily, which does help her some  She lived in with her daughter since 2018, she likes to play game, shopping, good appetite, sleeps well, use bathroom well.   Her mood is fairly stable taking Abilify 5 mg daily, Remeron 15mg  qhs, zoloft 50mg  qam, viibryad 40mg ,  she is going to [facility] pys[facility] Dr. Graciella Freer, she dose have the diagnosis of  bipolar disorder,  Keppra 500 mg 2 tablets twice a day with no recurrent seizure  UPDATE October 19 2022: She lives with her daughter since 2017, she likes to play games on the phone, walking up and down step occasionally,     Her son died last night, dealing with grieve, not sleeping well recently, good appetite, no recurrent seizure,  She is also on sinemet 25/100 tid, has helped her tremor, not sure about gait,   On polypharmacy, including zoloft, viibryd, abilify, remeron.   PHYSICAL EXAM:   Vitals:   10/19/22 0949  BP: (!) 146/87  Pulse: 80  Weight: 193 lb 8 oz (87.8 kg)   Height: 5\' 3"  (1.6 m)    Body mass index is 34.28 kg/m.  PHYSICAL EXAMNIATION:  Gen: NAD, conversant, well nourised, well groomed                     Cardiovascular: Regular rate rhythm, no peripheral edema, warm, nontender. Eyes: Conjunctivae clear without exudates or hemorrhage Neck: Supple, no carotid bruits. Pulmonary: Clear to auscultation bilaterally   NEUROLOGICAL EXAM:  MENTAL STATUS: Speech:    Speech is normal; fluent and spontaneous with normal comprehension.  Cognition:    10/13/2021    8:59 AM 02/09/2015    8:17 AM  Montreal Cognitive Assessment   Visuospatial/ Executive (0/5) 1 0  Naming (0/3) 2 3  Attention: Read list of digits (0/2) 0 0  Attention: Read list of letters (0/1) 1 1  Attention: Serial 7 subtraction starting at 100 (0/3) 1 0  Language: Repeat phrase (0/2) 2 1  Language : Fluency (0/1) 0 0  Abstraction (0/2) 1 1  Delayed Recall (0/5) 2 0  Orientation (0/6) 5 5  Total 15 11  Adjusted Score (based on education) 16 12      CRANIAL NERVES: CN II: Visual fields are full to confrontation. Pupils are round equal and briskly reactive to light. CN III, IV, VI: extraocular movement are normal. No ptosis. CN V: Facial sensation is intact to light touch CN VII: Face is symmetric with normal eye closure  CN VIII: Hearing is normal to causal conversation. CN IX, X: Phonation is normal. CN XI: Head turning and shoulder shrug are intact  MOTOR: No resting tremor, mild right more than left rigidity, bradykinesia,  REFLEXES: Reflexes are hypoactive and symmetric  SENSORY: Intact to light touch .  COORDINATION: There is no trunk or limb dysmetria noted.  GAIT/STANCE: Rely on a walker to get up from seated position, dragging right leg some, unsteady  REVIEW OF SYSTEMS:  Full 14 system review of systems performed and notable only for as above All other review of systems were negative.   ALLERGIES: Allergies  Allergen Reactions   Codeine Hives     HOME MEDICATIONS: Current Outpatient Medications  Medication Sig Dispense Refill   acetaminophen (TYLENOL) 500 MG tablet Take 500 mg by mouth every 6 (six) hours as needed for mild pain or headache.     amLODipine (NORVASC) 2.5 MG tablet Take 2.5 mg by mouth daily.     ARIPiprazole (ABILIFY) 5 MG tablet Take 5 mg by mouth daily.      aspirin 81 MG chewable tablet Chew 1 tablet (81 mg total) by mouth daily. 30 tablet 2   atorvastatin (LIPITOR) 40 MG tablet Take 40 mg by mouth daily.     carbidopa-levodopa (SINEMET IR) 25-100 MG tablet TAKE 1 TABLET BY MOUTH THREE TIMES DAILY 270 tablet 0   carvedilol (COREG) 6.25 MG tablet Take 1 tablet (6.25 mg total) by mouth 2 (two) times daily with a meal. 180 tablet 3   gabapentin (NEURONTIN) 300 MG capsule Take 600 mg by mouth daily.      glimepiride (AMARYL) 2 MG tablet Take 2 mg by mouth daily before breakfast.     levETIRAcetam (KEPPRA) 500 MG tablet  TAKE 2 TABLETS BY MOUTH IN THE MORNING AND 2 AT NIGHT DAILY 360 tablet 3   mirtazapine (REMERON) 15 MG tablet Take 15 mg by mouth at bedtime.     Multiple Vitamin (MULTIVITAMIN) capsule Take 1 capsule by mouth daily.     repaglinide (PRANDIN) 0.5 MG tablet Take 0.5 mg by mouth 3 (three) times daily.     sertraline (ZOLOFT) 50 MG tablet Take 50 mg by mouth daily.     trimethoprim (TRIMPEX) 100 MG tablet Take 100 mg by mouth 2 (two) times daily.      VIIBRYD 40 MG TABS Take 40 mg by mouth daily.      No current facility-administered medications for this visit.    PAST MEDICAL HISTORY: Past Medical History:  Diagnosis Date   Anxiety    Dementia    Depression    Diabetes mellitus 2011   Headache(784.0)    History of echocardiogram    Echo 5/16: EF 55-60%, normal wall motion, grade 1 diastolic dysfunction   Hypertension    Seizure    Stroke 2002   right side weakness   UTI (urinary tract infection) 02/2015    PAST SURGICAL HISTORY: Past Surgical History:  Procedure Laterality Date    ABDOMINAL HYSTERECTOMY  1980's   APPENDECTOMY  1980's   CHOLECYSTECTOMY  1993   EYE SURGERY  2009   FOOT SURGERY     JOINT REPLACEMENT  2012   Hip, Knee   REVERSE SHOULDER ARTHROPLASTY  12/07/2011   Procedure: REVERSE SHOULDER ARTHROPLASTY;  Surgeon: Marin Shutter, MD;  Location: [facility];  Service: Orthopedics;  Laterality: Left;  left total reverse shoulder   TONSILLECTOMY  1951    FAMILY HISTORY: Family History  Problem Relation Age of Onset   Stroke Mother 46   Hypertension Mother    CAD Father 83   Heart attack Father    Anesthesia problems Neg Hx     SOCIAL HISTORY: Social History   Socioeconomic History   Marital status: Married    Spouse name: Denyse Amass   Number of children: 4   Years of education: 9th   Highest education level: Not on file  Occupational History   Occupation: Chartered certified accountant    Comment: Retired  Tobacco Use   Smoking status: Never   Smokeless tobacco: Never  Substance and Sexual Activity   Alcohol use: No    Alcohol/week: 0.0 standard drinks of alcohol   Drug use: No   Sexual activity: Not on file  Other Topics Concern   Not on file  Social History Narrative   Son recently died of cancer.  Lives at home with husband Thomes Cake) .  She has 3 children .    Education 10 th grade.   Right handed.   Caffeine use: 1 cup coffee/ day         Social Determinants of Health   Financial Resource Strain: Not on file  Food Insecurity: Not on file  Transportation Needs: Not on file  Physical Activity: Not on file  Stress: Not on file  Social Connections: Not on file  Intimate Partner Violence: Not on file      Marcial Pacas, M.D. Ph.D.  [facility] [address], [place], [place] [address] Ph: [phone] Fax: [phone]  CC:  Alroy Dust, L.[name], [place] [address] [place],  [place] [address]  Alroy Dust, L.[name], MD

## 2022-12-27 DIAGNOSIS — H5203 Hypermetropia, bilateral: Secondary | ICD-10-CM | POA: Diagnosis not present

## 2022-12-27 DIAGNOSIS — H35373 Puckering of macula, bilateral: Secondary | ICD-10-CM | POA: Diagnosis not present

## 2022-12-27 DIAGNOSIS — H4322 Crystalline deposits in vitreous body, left eye: Secondary | ICD-10-CM | POA: Diagnosis not present

## 2022-12-27 DIAGNOSIS — H01009 Unspecified blepharitis unspecified eye, unspecified eyelid: Secondary | ICD-10-CM | POA: Diagnosis not present

## 2023-05-08 DIAGNOSIS — E1142 Type 2 diabetes mellitus with diabetic polyneuropathy: Secondary | ICD-10-CM | POA: Diagnosis not present

## 2023-05-08 DIAGNOSIS — Z66 Do not resuscitate: Secondary | ICD-10-CM | POA: Diagnosis not present

## 2023-05-08 DIAGNOSIS — Z0000 Encounter for general adult medical examination without abnormal findings: Secondary | ICD-10-CM | POA: Diagnosis not present

## 2023-05-08 DIAGNOSIS — N1831 Chronic kidney disease, stage 3a: Secondary | ICD-10-CM | POA: Diagnosis not present

## 2023-05-08 DIAGNOSIS — I10 Essential (primary) hypertension: Secondary | ICD-10-CM | POA: Diagnosis not present

## 2023-05-08 DIAGNOSIS — E7800 Pure hypercholesterolemia, unspecified: Secondary | ICD-10-CM | POA: Diagnosis not present

## 2023-05-08 DIAGNOSIS — Z23 Encounter for immunization: Secondary | ICD-10-CM | POA: Diagnosis not present

## 2023-05-08 DIAGNOSIS — H6121 Impacted cerumen, right ear: Secondary | ICD-10-CM | POA: Diagnosis not present

## 2023-05-08 DIAGNOSIS — G20C Parkinsonism, unspecified: Secondary | ICD-10-CM | POA: Diagnosis not present

## 2023-05-09 ENCOUNTER — Other Ambulatory Visit: Payer: Self-pay | Admitting: Internal Medicine

## 2023-05-09 DIAGNOSIS — Z13820 Encounter for screening for osteoporosis: Secondary | ICD-10-CM

## 2023-05-17 ENCOUNTER — Ambulatory Visit: Payer: Medicare Other | Admitting: Neurology

## 2023-05-22 NOTE — Progress Notes (Unsigned)
No chief complaint on file.     ASSESSMENT AND PLAN  Martha Olsen is a 87 y.o. female   Right Basal ganglion Stroke  Vascular risk factor of aging, hypertension, hyperlipidemia, diabetes, obesity, sedentary lifestyle, obstructive sleep apnea, could not tolerate CPAP  Is on aspirin 81 mg daily  Seizure  In March 2013, again July 2020,  Tolerating Keppra 500 mg 2 tablets twice a day  Possible parkinsonism  Not typical idiopathic Parkinson's disease, likely secondary parkinsonism that is related to her long-term psychiatric medication treatment,  Martha Olsen previously reported Sinemet does help her tremor,  barely noticeable today, we will taper off,  Dementia with agitation  Likely central nervous system degenerative disorder, with vascular component, prolonged history of bipolar disorder also contribute  Stable now with polypharmacy    DIAGNOSTIC DATA (LABS, IMAGING, TESTING) - I reviewed patient records, labs, notes, testing and imaging myself where available. Personally reviewed MRI of the brain February 2020, no acute abnormality, mild small vessel disease, progression of atrophy, stable infarction of right basal ganglia  MEDICAL HISTORY:  Martha Olsen is a 87 year old female, followed up by our clinic for dementia, her primary care physician is Dr. Clovis Olsen, Martha Saucer, MD (Inactive)   I reviewed and summarized the referring note.  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  Her mother died at age 72 from stroke  She has a witnessed seizure on March 2013, was treated with Keppra 500 mg twice a day, had no recurrent seizure  Personally reviewed MRI of the brain, demonstrated old basal ganglia, corona radiata stroke, no acute abnormality,  EEG was normal  She has slow worsening memory loss, quit driving since [age], over the years, she also complains of increased gait abnormality due to joints pain, taking tramadol as needed,  There was increased confusion when she suffered a UTI, later paranoid, confusion agitation persistent even without active infection, began to treat with Seroquel titrating dose since 2016, also Ativan as needed, because of abnormal EEG in 2016, asymmetric slowing of left hemisphere, along with her increased confusion, Keppra dose was increased to 500/1000, she tolerated well  Recurrent seizure January 22, 2019, while she was on vacation, she was treated at the local hospital, MRI of the brain showed no acute infarction, Keppra dose was further increased to five 1000 mg twice daily,  she also developed some parkinsonian features, started on Sinemet 25/100 mg 3 times daily, which does help her some  She lived in with her Martha Olsen since 2018, she likes to play game, shopping, good appetite, sleeps well, use bathroom well.   Her mood is fairly stable taking Abilify 5 mg daily, Remeron 15mg  qhs, zoloft 50mg  qam, viibryad 40mg ,  she is going to [facility] p[facility] Dr. Pincus Badder, she dose have the diagnosis of  bipolar disorder,  Keppra 500 mg 2 tablets twice a day with no recurrent seizure  UPDATE October 19 2022: She lives with her Martha Olsen since 2017, she likes to play games on the phone, walking up and down step occasionally,     Her son died last night, dealing with grieve, not sleeping well recently, good appetite, no recurrent seizure,   She is also on sinemet 25/100 tid, has helped her tremor, not sure about gait,   On polypharmacy, including zoloft,  viibryd, abilify, remeron.  Update May 23, 2023 SS:    PHYSICAL EXAM:   There were no vitals filed for this visit.   There is no height or weight on file to calculate BMI.  PHYSICAL EXAMNIATION:  Gen: NAD, conversant, well nourised, well groomed                      Cardiovascular: Regular rate rhythm, no peripheral edema, warm, nontender. Eyes: Conjunctivae clear without exudates or hemorrhage Neck: Supple, no carotid bruits. Pulmonary: Clear to auscultation bilaterally   NEUROLOGICAL EXAM:  MENTAL STATUS: Speech:    Speech is normal; fluent and spontaneous with normal comprehension.  Cognition:    10/13/2021    8:59 AM 02/09/2015    8:17 AM  Montreal Cognitive Assessment   Visuospatial/ Executive (0/5) 1 0  Naming (0/3) 2 3  Attention: Read list of digits (0/2) 0 0  Attention: Read list of letters (0/1) 1 1  Attention: Serial 7 subtraction starting at 100 (0/3) 1 0  Language: Repeat phrase (0/2) 2 1  Language : Fluency (0/1) 0 0  Abstraction (0/2) 1 1  Delayed Recall (0/5) 2 0  Orientation (0/6) 5 5  Total 15 11  Adjusted Score (based on education) 16 12      CRANIAL NERVES: CN II: Visual fields are full to confrontation. Pupils are round equal and briskly reactive to light. CN III, IV, VI: extraocular movement are normal. No ptosis. CN V: Facial sensation is intact to light touch CN VII: Face is symmetric with normal eye closure  CN VIII: Hearing is normal to causal conversation. CN IX, X: Phonation is normal. CN XI: Head turning and shoulder shrug are intact  MOTOR: No resting tremor, mild right more than left rigidity, bradykinesia,  REFLEXES: Reflexes are hypoactive and symmetric  SENSORY: Intact to light touch .  COORDINATION: There is no trunk or limb dysmetria noted.  GAIT/STANCE: Rely on a walker to get up from seated position, dragging right leg some, unsteady  REVIEW OF SYSTEMS:  Full 14 system review of systems performed and notable only for as above All other review of systems were negative.   ALLERGIES: Allergies  Allergen Reactions   Codeine Hives    HOME MEDICATIONS: Current Outpatient Medications  Medication Sig Dispense Refill   acetaminophen (TYLENOL) 500 MG tablet Take 500  mg by mouth every 6 (six) hours as needed for mild pain or headache.     amLODipine (NORVASC) 2.5 MG tablet Take 2.5 mg by mouth daily.     ARIPiprazole (ABILIFY) 5 MG tablet Take 5 mg by mouth daily.      aspirin 81 MG chewable tablet Chew 1 tablet (81 mg total) by mouth daily. 30 tablet 2   atorvastatin (LIPITOR) 40 MG tablet Take 40 mg by mouth daily.     carbidopa-levodopa (SINEMET IR) 25-100 MG tablet Take 1 tablet by mouth 3 (three) times daily. 270 tablet 3   carvedilol (COREG) 6.25 MG tablet Take 1 tablet (6.25 mg total) by mouth 2 (two) times daily with a meal. 180 tablet 3   gabapentin (NEURONTIN) 300 MG capsule Take 600 mg by mouth daily.      glimepiride (AMARYL) 2 MG tablet Take 2 mg by mouth daily before breakfast.     levETIRAcetam (KEPPRA) 500 MG tablet TAKE 2 TABLETS BY MOUTH IN THE MORNING AND 2 AT NIGHT DAILY 360 tablet 3   mirtazapine (REMERON) 15 MG tablet Take 15 mg by  mouth at bedtime.     Multiple Vitamin (MULTIVITAMIN) capsule Take 1 capsule by mouth daily.     repaglinide (PRANDIN) 0.5 MG tablet Take 0.5 mg by mouth 3 (three) times daily.     sertraline (ZOLOFT) 50 MG tablet Take 50 mg by mouth daily.     trimethoprim (TRIMPEX) 100 MG tablet Take 100 mg by mouth 2 (two) times daily.      VIIBRYD 40 MG TABS Take 40 mg by mouth daily.      No current facility-administered medications for this visit.    PAST MEDICAL HISTORY: Past Medical History:  Diagnosis Date   Anxiety    Dementia (HCC)    Depression    Diabetes mellitus 2011   Headache(784.0)    History of echocardiogram    Echo 5/16: EF 55-60%, normal wall motion, grade 1 diastolic dysfunction   Hypertension    Seizure (HCC)    Stroke (HCC) 2002   right side weakness   UTI (urinary tract infection) 02/2015    PAST SURGICAL HISTORY: Past Surgical History:  Procedure Laterality Date   ABDOMINAL HYSTERECTOMY  1980's   APPENDECTOMY  1980's   CHOLECYSTECTOMY  1993   EYE SURGERY  2009   FOOT SURGERY      JOINT REPLACEMENT  2012   Hip, Knee   REVERSE SHOULDER ARTHROPLASTY  12/07/2011   Procedure: REVERSE SHOULDER ARTHROPLASTY;  Surgeon: Senaida Lange, MD;  Location: [facility];  Service: Orthopedics;  Laterality: Left;  left total reverse shoulder   TONSILLECTOMY  1951    FAMILY HISTORY: Family History  Problem Relation Age of Onset   Stroke Mother 32   Hypertension Mother    CAD Father 36   Heart attack Father    Anesthesia problems Neg Hx     SOCIAL HISTORY: Social History   Socioeconomic History   Marital status: Married    Spouse name: Verdon Cummins   Number of children: 4   Years of education: 9th   Highest education level: Not on file  Occupational History   Occupation: Programmer, applications    Comment: Retired  Tobacco Use   Smoking status: Never   Smokeless tobacco: Never  Substance and Sexual Activity   Alcohol use: No    Alcohol/week: 0.0 standard drinks of alcohol   Drug use: No   Sexual activity: Not on file  Other Topics Concern   Not on file  Social History Narrative   Son recently died of cancer.  Lives at home with husband Baird Cancer) .  She has 3 children .    Education 10 th grade.   Right handed.   Caffeine use: 1 cup coffee/ day         Social Determinants of Health   Financial Resource Strain: Not on file  Food Insecurity: Not on file  Transportation Needs: Not on file  Physical Activity: Not on file  Stress: Not on file  Social Connections: Unknown (11/21/2022)   Received from [facility] Short Social Needs Screening - Social Connection    Would you like help with any of the following needs: food, medicine/medical supplies, transportation, loneliness, housing or utilities?: Not on file  Intimate Partner Violence: Not on file     Margie Ege, Edrick Oh, DNP  [facility] [address] [place], [place] [address] [phone]

## 2023-05-23 ENCOUNTER — Ambulatory Visit: Payer: Medicare Other | Admitting: Neurology

## 2023-05-23 ENCOUNTER — Encounter: Payer: Self-pay | Admitting: Neurology

## 2023-05-23 VITALS — BP 136/82 | Ht 61.0 in | Wt 185.0 lb

## 2023-05-23 DIAGNOSIS — G20C Parkinsonism, unspecified: Secondary | ICD-10-CM | POA: Diagnosis not present

## 2023-05-23 DIAGNOSIS — F03A4 Unspecified dementia, mild, with anxiety: Secondary | ICD-10-CM | POA: Diagnosis not present

## 2023-05-23 DIAGNOSIS — G40909 Epilepsy, unspecified, not intractable, without status epilepticus: Secondary | ICD-10-CM

## 2023-05-23 DIAGNOSIS — I639 Cerebral infarction, unspecified: Secondary | ICD-10-CM

## 2023-05-23 MED ORDER — CARBIDOPA-LEVODOPA 25-100 MG PO TABS: 1.0000 | ORAL_TABLET | Freq: Three times a day (TID) | ORAL | 3 refills | Status: DC

## 2023-05-23 MED ORDER — LEVETIRACETAM 500 MG PO TABS: ORAL_TABLET | ORAL | 3 refills | Status: DC

## 2023-05-23 NOTE — Patient Instructions (Signed)
Continue current medications Strict management of vascular risk factors with a goal BP less than 130/90, A1c less than 7.0, LDL less than 70 for secondary stroke prevention. Continue aspirin 81 mg daily. Call for seizures   Meds ordered this encounter  Medications   levETIRAcetam (KEPPRA) 500 MG tablet    Sig: TAKE 2 TABLETS BY MOUTH IN THE MORNING AND 2 AT NIGHT DAILY    Dispense:  360 tablet    Refill:  3   carbidopa-levodopa (SINEMET IR) 25-100 MG tablet    Sig: Take 1 tablet by mouth 3 (three) times daily.    Dispense:  270 tablet    Refill:  3

## 2023-11-26 DIAGNOSIS — B351 Tinea unguium: Secondary | ICD-10-CM | POA: Diagnosis not present

## 2023-11-26 DIAGNOSIS — I6381 Other cerebral infarction due to occlusion or stenosis of small artery: Secondary | ICD-10-CM | POA: Diagnosis not present

## 2023-11-26 DIAGNOSIS — E1142 Type 2 diabetes mellitus with diabetic polyneuropathy: Secondary | ICD-10-CM | POA: Diagnosis not present

## 2023-11-26 DIAGNOSIS — I10 Essential (primary) hypertension: Secondary | ICD-10-CM | POA: Diagnosis not present

## 2023-11-26 DIAGNOSIS — R011 Cardiac murmur, unspecified: Secondary | ICD-10-CM | POA: Diagnosis not present

## 2023-11-26 DIAGNOSIS — R0602 Shortness of breath: Secondary | ICD-10-CM | POA: Diagnosis not present

## 2023-11-26 DIAGNOSIS — G20C Parkinsonism, unspecified: Secondary | ICD-10-CM | POA: Diagnosis not present

## 2023-11-26 DIAGNOSIS — E7800 Pure hypercholesterolemia, unspecified: Secondary | ICD-10-CM | POA: Diagnosis not present

## 2023-11-26 DIAGNOSIS — N1831 Chronic kidney disease, stage 3a: Secondary | ICD-10-CM | POA: Diagnosis not present

## 2023-12-06 ENCOUNTER — Ambulatory Visit: Admitting: Podiatry

## 2023-12-06 DIAGNOSIS — L602 Onychogryphosis: Secondary | ICD-10-CM

## 2023-12-06 DIAGNOSIS — B351 Tinea unguium: Secondary | ICD-10-CM

## 2023-12-06 DIAGNOSIS — M79674 Pain in right toe(s): Secondary | ICD-10-CM

## 2023-12-06 NOTE — Patient Instructions (Addendum)
 STARTING TOMORROW: Place 1/4 cup of epsom salts in a quart of warm tap water .  Submerge your foot, or feet, in the solution and soak for 20 minutes.  This soak can be performed once or twice a day.  Next, remove your foot or feet from solution, blot dry the affected area. For the full 2 weeks.   IF YOUR SKIN BECOMES IRRITATED WHILE USING THESE INSTRUCTIONS, IT IS OKAY TO SWITCH TO  WHITE VINEGAR AND WATER  instead of Epsom salt.  As another alternative soak, you may use antibacterial soap and water .  Dry the area well after soaking.  Apply a small amount of antibiotic ointment (ie:  Neosporin, Triple Antibiotic ointment, or Bacitracin) to the area, followed by a Bandaid or light gauze dressing.  As the area starts to dry out over the next few days, you can remove the covering at bedtime to encourage the toe to dry out more.  Once it forms a dry scab, you can discontinue these instructions.  Monitor for any signs/symptoms of infection. Call the office immediately if any occur or go directly to the emergency room. Call with any questions/concerns.

## 2023-12-06 NOTE — Progress Notes (Unsigned)
        Subjective:  Patient ID: Martha Olsen, female    DOB: Dec 09, 1935,  MRN: [id number]  Martha Olsen presents to clinic today for:  Chief Complaint  Patient presents with   Nail Problem    Right first nail is growing up and back, Daughter is here with her today. They would like to have the whole nail removed. Last A1c was 7.1, two weeks ago and takes ASA 71   Patient presents with her daughter today noting pain in the right great toenail.  It is very thick and difficult to trim.  Hurts in close toed shoes.  The daughter notes that the patient has some Parkinson's disease and dementia.  Patient is diabetic and her last A1c was 7.1.  States the nail has never been removed in the past.  Allergies  Allergen Reactions   Codeine Hives and Dermatitis    itching    Objective:  Martha Olsen is a pleasant 88 y.o. female in NAD. AAO x 3.  Vascular Examination: Capillary refill time is 3-5 seconds to toes bilateral. Palpable pedal pulses b/l LE. Digital hair present b/l. No pedal edema b/l. Skin temperature gradient WNL b/l. No varicosities b/l. No cyanosis or clubbing noted b/l.   Dermatological Examination: There is 5-6 mm thickening of the right great toenail along with distal onycholysis, discoloration, and incurvation of the bilateral nail border.  There is pain on palpation of the toenail.  No drainage or erythema is noted  Neurological Examination: Epicritic sensation is intact to the toes.  Assessment/Plan: 1. Dermatophytosis of nail   2. Pain in right toe(s)   3. Onychogryphosis    Discussed patient's condition today.  After obtaining patient consent, the right great toe was anesthetized with a 50:50 mixture of 1% lidocaine  plain and 0.5% bupivacaine  plain for a total of 3cc's administered.  Upon confirmation of anesthesia, a freer elevator was utilized to free the entire right hallux toenail from the nail bed.  The nail border was then avulsed proximal to  the eponychium and removed in toto.  The area was inspected for any remaining spicules.   Antibiotic ointment and a DSD were applied, followed by a Coban dressing.  Patient tolerated the anesthetic and procedure well and will f/u in 2-3 weeks for recheck.  Patient given post-procedure instructions for daily 15-minute Epsom salt soaks, antibiotic ointment and daily use of Bandaids until toe starts to dry / form eschar.    F/u 2 weeks for recheck of nail avulsion   Akito Boomhower DEstle Hemp, DPM, FACFAS [facility]     [address] [place], [place] [address]                Office [phone]  Fax [phone]

## 2023-12-10 ENCOUNTER — Encounter: Payer: Self-pay | Admitting: Podiatry

## 2023-12-15 DIAGNOSIS — M150 Primary generalized (osteo)arthritis: Secondary | ICD-10-CM | POA: Diagnosis not present

## 2023-12-15 DIAGNOSIS — I10 Essential (primary) hypertension: Secondary | ICD-10-CM | POA: Diagnosis not present

## 2023-12-15 DIAGNOSIS — N1831 Chronic kidney disease, stage 3a: Secondary | ICD-10-CM | POA: Diagnosis not present

## 2023-12-15 DIAGNOSIS — G20C Parkinsonism, unspecified: Secondary | ICD-10-CM | POA: Diagnosis not present

## 2023-12-20 ENCOUNTER — Ambulatory Visit: Admitting: Podiatry

## 2023-12-20 DIAGNOSIS — B351 Tinea unguium: Secondary | ICD-10-CM

## 2023-12-20 NOTE — Progress Notes (Signed)
       Subjective:  Patient ID: Martha Olsen, female    DOB: 1935/12/30,  MRN: [id number]  Chief Complaint  Patient presents with   PNA check    Right hallux nail temporary removal follow up. She has been soaking in the mornings and in the afternoons. It looks great. A1c 7.1 in May, takes ASA     Martha Olsen presents to clinic today for f/u of avulsion of the right hallux toenail secondary to severe onychomycosis with pain.  She has no pain at this time.  States that it has not been draining over the past few days.  She still performing Epsom salt soaks in the area using iodine solution on the area.   Allergies  Allergen Reactions   Codeine Hives and Dermatitis    itching    Objective:  Vascular Examination: Capillary refill time is 3-5 seconds to toes bilateral. Palpable pedal pulses b/l LE. Digital hair present b/l. No pedal edema b/l. Skin temperature gradient WNL b/l. No varicosities b/l. No cyanosis or clubbing noted b/l.   Dermatological Examination: Upon inspection of the nail avulsion site, there are no clinical signs of infection.  No purulence, no necrosis, no malodor present.  Minimal to no erythema present.  Eschar formed along nail margin.  No pain on palpation of area.   Assessment/Plan: 1. Dermatophytosis of nail    Patient may discontinue all postprocedure instructions at this time.  Follow-up as needed.  The daughter trims the toenails for the patient.  They can call the office if they ever want us  to take over that care.   Joe Murders, DPM, FACFAS [facility]     [address] [place], [place] [address]                Office [phone]  Fax [phone]

## 2023-12-28 DIAGNOSIS — R011 Cardiac murmur, unspecified: Secondary | ICD-10-CM | POA: Diagnosis not present

## 2024-01-08 ENCOUNTER — Other Ambulatory Visit: Payer: Medicare Other

## 2024-01-14 DIAGNOSIS — G20C Parkinsonism, unspecified: Secondary | ICD-10-CM | POA: Diagnosis not present

## 2024-01-14 DIAGNOSIS — I10 Essential (primary) hypertension: Secondary | ICD-10-CM | POA: Diagnosis not present

## 2024-01-14 DIAGNOSIS — M150 Primary generalized (osteo)arthritis: Secondary | ICD-10-CM | POA: Diagnosis not present

## 2024-01-14 DIAGNOSIS — N1831 Chronic kidney disease, stage 3a: Secondary | ICD-10-CM | POA: Diagnosis not present

## 2024-02-14 DIAGNOSIS — N1831 Chronic kidney disease, stage 3a: Secondary | ICD-10-CM | POA: Diagnosis not present

## 2024-02-14 DIAGNOSIS — M150 Primary generalized (osteo)arthritis: Secondary | ICD-10-CM | POA: Diagnosis not present

## 2024-02-14 DIAGNOSIS — I10 Essential (primary) hypertension: Secondary | ICD-10-CM | POA: Diagnosis not present

## 2024-02-14 DIAGNOSIS — G20C Parkinsonism, unspecified: Secondary | ICD-10-CM | POA: Diagnosis not present

## 2024-05-14 DIAGNOSIS — M8588 Other specified disorders of bone density and structure, other site: Secondary | ICD-10-CM | POA: Diagnosis not present

## 2024-05-14 DIAGNOSIS — E1142 Type 2 diabetes mellitus with diabetic polyneuropathy: Secondary | ICD-10-CM | POA: Diagnosis not present

## 2024-05-14 DIAGNOSIS — N1831 Chronic kidney disease, stage 3a: Secondary | ICD-10-CM | POA: Diagnosis not present

## 2024-05-14 DIAGNOSIS — I10 Essential (primary) hypertension: Secondary | ICD-10-CM | POA: Diagnosis not present

## 2024-05-14 DIAGNOSIS — G20C Parkinsonism, unspecified: Secondary | ICD-10-CM | POA: Diagnosis not present

## 2024-05-14 DIAGNOSIS — E7800 Pure hypercholesterolemia, unspecified: Secondary | ICD-10-CM | POA: Diagnosis not present

## 2024-05-15 ENCOUNTER — Other Ambulatory Visit (HOSPITAL_COMMUNITY): Payer: Self-pay | Admitting: Internal Medicine

## 2024-05-15 ENCOUNTER — Ambulatory Visit (HOSPITAL_COMMUNITY)
Admission: RE | Admit: 2024-05-15 | Discharge: 2024-05-15 | Disposition: A | Discharge status: Home / Self Care | Source: Ambulatory Visit | Attending: Vascular Surgery | Admitting: Vascular Surgery

## 2024-05-15 DIAGNOSIS — M7989 Other specified soft tissue disorders: Secondary | ICD-10-CM | POA: Insufficient documentation

## 2024-05-24 ENCOUNTER — Other Ambulatory Visit: Payer: Self-pay | Admitting: Neurology

## 2024-05-27 ENCOUNTER — Ambulatory Visit: Payer: Medicare Other | Admitting: Neurology

## 2024-05-27 ENCOUNTER — Encounter: Payer: Self-pay | Admitting: Neurology

## 2024-05-27 VITALS — Ht 62.0 in | Wt 175.0 lb

## 2024-05-27 DIAGNOSIS — I639 Cerebral infarction, unspecified: Secondary | ICD-10-CM | POA: Diagnosis not present

## 2024-05-27 DIAGNOSIS — F03A4 Unspecified dementia, mild, with anxiety: Secondary | ICD-10-CM | POA: Diagnosis not present

## 2024-05-27 DIAGNOSIS — G40909 Epilepsy, unspecified, not intractable, without status epilepticus: Secondary | ICD-10-CM

## 2024-05-27 DIAGNOSIS — G20C Parkinsonism, unspecified: Secondary | ICD-10-CM

## 2024-05-27 MED ORDER — LEVETIRACETAM 500 MG PO TABS
ORAL_TABLET | ORAL | 4 refills | Status: AC
Start: 2024-05-27 — End: ?

## 2024-05-27 MED ORDER — CARBIDOPA-LEVODOPA 25-100 MG PO TABS: 1.0000 | ORAL_TABLET | Freq: Three times a day (TID) | ORAL | 3 refills | Status: AC

## 2024-05-27 NOTE — Patient Instructions (Signed)
 Great to see you today.  Continue current medications.  Exercise and be active.  Call for seizure activity.  Follow-up in 1 year.  Thanks!!

## 2024-05-27 NOTE — Progress Notes (Signed)
 Chief Complaint  Patient presents with   RM1/MEMORY    Pt is here with her Daughter. Pt's Daughter states that things have been great since her last appointment.    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   Right Basal ganglion Stroke  Vascular risk factor of aging, hypertension, hyperlipidemia, diabetes, obesity, sedentary lifestyle, obstructive sleep apnea, could not tolerate CPAP  Is on aspirin  81 mg daily for secondary stroke prevention  Seizure  Last March 2013, July 2020  Continue Keppra  500 mg 2 tablets twice a day  Possible parkinsonism  Not typical idiopathic Parkinson's disease, likely secondary parkinsonism that is related to her long-term psychiatric medication treatment,  Tried to taper off Sinemet  25/100 mg, daughter reported increased tremor, will continue 1 tablet 3 times daily  Dementia with agitation  Likely central nervous system degenerative disorder, with vascular component, prolonged history of bipolar disorder also contribute  Stable now with polypharmacy, continues to see psychiatry.  MMSE 25/30.   Follow-up with me in 1 year or sooner if needed  DIAGNOSTIC DATA (LABS, IMAGING, TESTING) - I reviewed patient records, labs, notes, testing and imaging myself where available. Personally reviewed MRI of the brain February 2020, no acute abnormality, mild small vessel disease, progression of atrophy, stable infarction of right basal ganglia  MEDICAL HISTORY:  Martha Olsen is a 88 year old female, followed up by our clinic for dementia, her primary care physician is Dr. Vernon, Velna SAUNDERS, MD   I reviewed and summarized the referring note.  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  Her mother died at age 54  from stroke  She has a witnessed seizure on March 2013, was treated with Keppra  500 mg twice a day, had no recurrent seizure  Personally reviewed MRI of the brain, demonstrated old basal ganglia, corona radiata stroke, no acute abnormality, EEG was normal  She has slow worsening memory loss, quit driving since [age], over the years, she also complains of increased gait abnormality due to joints pain, taking tramadol  as needed,  There was increased confusion when she suffered a UTI, later paranoid, confusion agitation persistent even without active infection, began to treat with Seroquel  titrating dose since 2016, also Ativan  as needed, because of abnormal EEG in 2016, asymmetric slowing of left hemisphere, along with her increased confusion, Keppra  dose was increased to 500/1000, she tolerated well  Recurrent seizure January 22, 2019, while she was on vacation, she was treated at the local hospital, MRI of the brain showed no acute infarction, Keppra  dose was further increased to five 1000 mg twice daily,  she also developed some parkinsonian features, started on Sinemet  25/100 mg 3 times daily, which does help her some  She lived in with her daughter since 2018, she likes to play game, shopping, good appetite, sleeps well, use bathroom well.   Her mood is fairly stable taking Abilify  5 mg daily, Remeron 15mg  qhs, zoloft  50mg  qam, viibryad 40mg ,  she is going to [facility] pyschiatrist Dr. Devota, she dose have the diagnosis of  bipolar disorder,  Keppra  500 mg 2 tablets twice a day with no recurrent seizure  UPDATE October 19 2022: She lives with her daughter since 2017, she likes to play games on the phone, walking up and down  step occasionally,     Her son died last night, dealing with grieve, not sleeping well recently, good appetite, no recurrent seizure,   She is also on sinemet  25/100 tid, has helped her tremor, not sure about gait,   On polypharmacy, including zoloft , viibryd, abilify ,  remeron.  Update May 23, 2023 SS: MMSE 23/30. Living with Jenkins. [name] manages medications. No seizures remains on Keppra  1000 mg BID. On aspirin  81 mg daily for CVA secondary prevention. Remains on Sinemet , daughter tried to stop it, had a lot of tremors with eating, restarted 1 tablet TID 8, 12, 5. Memory doing fairly well, slower to find words, recall. Using walker, no falls. Mood is doing well, continues to see psych. Overall doing well, stable.   Update May 27, 2024 SS: MMSE 25/30. Lives with her daughter. No seizures. On Keppra  1000 mg BID. Sinemet  25/100 TID. With activity, up and down stairs shaky all over. Memory is stable. Using walker, no falls. Health is good. Seeing psych. Freezes when steps into shower. Need help with ADLs. Sedentary watches TV, plays games on phone. Sleeping and eating good.   PHYSICAL EXAM:   Vitals:   05/27/24 1046  Weight: 175 lb (79.4 kg)  Height: 5' 2 (1.575 m)    Body mass index is 32.01 kg/m.  PHYSICAL EXAMNIATION:  Gen: NAD, conversant, well nourised, well groomed                      NEUROLOGICAL EXAM:  MENTAL STATUS: Speech:    Speech is normal; fluent and spontaneous with normal comprehension.   Cognition:    10/13/2021    8:59 AM 02/09/2015    8:17 AM  Montreal Cognitive Assessment   Visuospatial/ Executive (0/5) 1 0  Naming (0/3) 2 3  Attention: Read list of digits (0/2) 0 0  Attention: Read list of letters (0/1) 1 1  Attention: Serial 7 subtraction starting at 100 (0/3) 1 0  Language: Repeat phrase (0/2) 2 1  Language : Fluency (0/1) 0 0  Abstraction (0/2) 1 1  Delayed Recall (0/5) 2 0  Orientation (0/6) 5 5  Total 15 11  Adjusted Score (based on education) 16 12         05/27/2024   10:53 AM 05/23/2023    2:08 PM 01/25/2021    1:14 PM  MMSE - Mini Mental State Exam  Orientation to time 5 5 5   Orientation to Place 5 5 5   Registration 3 3 3   Attention/ Calculation 0 0 2  Recall 3 2 3   Language- name 2 objects 2 2  2   Language- repeat 1 1 1   Language- follow 3 step command 3 3 3   Language- read & follow direction 1 1 1   Write a sentence 1 1 1   Copy design 1 0 0  Copy design-comments   4 legged animals x 1 min: 9  Total score 25 23 26    CRANIAL NERVES: CN II: Visual fields are full to confrontation. Pupils are round equal and briskly reactive to light. CN III, IV, VI: extraocular movement are normal. No ptosis. CN V: Facial sensation is intact to light touch CN VII: Face is symmetric with normal eye closure   MOTOR: No resting tremor, mild right more than left rigidity, bradykinesia,  SENSORY: Intact to light touch .  COORDINATION: Good finger-nose-finger bilaterally.  No tremor noted today.   GAIT/STANCE: Rely on a walker to get up from seated position, dragging right leg  some  REVIEW OF SYSTEMS:  Full 14 system review of systems performed and notable only for as above All other review of systems were negative.   ALLERGIES: Allergies  Allergen Reactions   Codeine Hives and Dermatitis    itching   Metformin Diarrhea    HOME MEDICATIONS: Current Outpatient Medications  Medication Sig Dispense Refill   acetaminophen  (TYLENOL ) 500 MG tablet Take 500 mg by mouth every 6 (six) hours as needed for mild pain or headache.     amLODipine (NORVASC) 2.5 MG tablet Take 2.5 mg by mouth daily.     ARIPiprazole  (ABILIFY ) 5 MG tablet Take 5 mg by mouth daily.      aspirin  81 MG chewable tablet Chew 1 tablet (81 mg total) by mouth daily. 30 tablet 2   atorvastatin  (LIPITOR) 40 MG tablet Take 40 mg by mouth daily.     carvedilol  (COREG ) 6.25 MG tablet Take 1 tablet (6.25 mg total) by mouth 2 (two) times daily with a meal. 180 tablet 3   gabapentin  (NEURONTIN ) 300 MG capsule Take 600 mg by mouth daily.      glimepiride (AMARYL) 2 MG tablet Take 2 mg by mouth daily before breakfast.     mirtazapine (REMERON) 15 MG tablet Take 15 mg by mouth at bedtime.     Multiple Vitamin (MULTIVITAMIN) capsule  Take 1 capsule by mouth daily.     repaglinide (PRANDIN) 0.5 MG tablet Take 0.5 mg by mouth 3 (three) times daily.     sertraline  (ZOLOFT ) 50 MG tablet Take 50 mg by mouth daily.     trimethoprim (TRIMPEX) 100 MG tablet Take 100 mg by mouth 2 (two) times daily.      VIIBRYD 40 MG TABS Take 40 mg by mouth daily.      carbidopa -levodopa  (SINEMET  IR) 25-100 MG tablet Take 1 tablet by mouth 3 (three) times daily. 270 tablet 3   levETIRAcetam  (KEPPRA ) 500 MG tablet TAKE 2 TABLETS BY MOUTH DAILY IN THE MORNING AND 2 TABLETS NIGHTLY 360 tablet 4   losartan (COZAAR) 25 MG tablet 1 tablet Orally Once a day; Duration: 90 days     melatonin 5 MG TABS 1 tablet Orally Once a day at bedtime     No current facility-administered medications for this visit.    PAST MEDICAL HISTORY: Past Medical History:  Diagnosis Date   Anxiety    Dementia (HCC)    Depression    Diabetes mellitus 2011   Headache(784.0)    History of echocardiogram    Echo 5/16: EF 55-60%, normal wall motion, grade 1 diastolic dysfunction   Hypertension    Seizure (HCC)    Stroke (HCC) 2002   right side weakness   UTI (urinary tract infection) 02/2015    PAST SURGICAL HISTORY: Past Surgical History:  Procedure Laterality Date   ABDOMINAL HYSTERECTOMY  1980's   APPENDECTOMY  1980's   CHOLECYSTECTOMY  1993   EYE SURGERY  2009   FOOT SURGERY     JOINT REPLACEMENT  2012   Hip, Knee   REVERSE SHOULDER ARTHROPLASTY  12/07/2011   Procedure: REVERSE SHOULDER ARTHROPLASTY;  Surgeon: Franky CHRISTELLA Pointer, MD;  Location: [facility] OR;  Service: Orthopedics;  Laterality: Left;  left total reverse shoulder   TONSILLECTOMY  1951    FAMILY HISTORY: Family History  Problem Relation Age of Onset   Stroke Mother 9   Hypertension Mother    CAD Father 57   Heart attack Father    Anesthesia problems Neg  Hx     SOCIAL HISTORY: Social History   Socioeconomic History   Marital status: Married    Spouse name: Josefa   Number of children: 4    Years of education: 9th   Highest education level: Not on file  Occupational History   Occupation: Programmer, applications    Comment: Retired  Tobacco Use   Smoking status: Never   Smokeless tobacco: Never  Substance and Sexual Activity   Alcohol use: No    Alcohol/week: 0.0 standard drinks of alcohol   Drug use: No   Sexual activity: Not on file  Other Topics Concern   Not on file  Social History Narrative   Son recently died of cancer.  Lives at home with husband Celinda) .  She has 3 children .    Education 10 th grade.   Right handed.   Caffeine use: 1 cup coffee/ day         Social Drivers of Corporate Investment Banker Strain: Not on file  Food Insecurity: Not on file  Transportation Needs: Not on file  Physical Activity: Not on file  Stress: Not on file  Social Connections: Unknown (11/21/2022)   Received from [facility] Short Social Needs Screening - Social Connection    Would you like help with any of the following needs: food, medicine/medical supplies, transportation, loneliness, housing or utilities?: Not on file  Intimate Partner Violence: Not on file   Lauraine Born, SCHARLENE, DNP  [facility] [address] [place], [place] [address] [phone]

## 2025-05-27 ENCOUNTER — Ambulatory Visit: Admitting: Neurology
